# Patient Record
Sex: Male | Born: 1955 | Race: Black or African American | Hispanic: No | Marital: Married | Smoking: Current some day smoker
Health system: Southern US, Community
[De-identification: ages and names within clinical notes are randomized; demographics above are authoritative.]

## PROBLEM LIST (undated history)

## (undated) DIAGNOSIS — E1140 Type 2 diabetes mellitus with diabetic neuropathy, unspecified: Secondary | ICD-10-CM

## (undated) DIAGNOSIS — C61 Malignant neoplasm of prostate: Secondary | ICD-10-CM

## (undated) DIAGNOSIS — I69359 Hemiplegia and hemiparesis following cerebral infarction affecting unspecified side: Principal | ICD-10-CM

## (undated) DIAGNOSIS — I739 Peripheral vascular disease, unspecified: Secondary | ICD-10-CM

## (undated) DIAGNOSIS — E119 Type 2 diabetes mellitus without complications: Secondary | ICD-10-CM

## (undated) DIAGNOSIS — I10 Essential (primary) hypertension: Secondary | ICD-10-CM

## (undated) DIAGNOSIS — I639 Cerebral infarction, unspecified: Secondary | ICD-10-CM

## (undated) DIAGNOSIS — E785 Hyperlipidemia, unspecified: Secondary | ICD-10-CM

## (undated) DIAGNOSIS — I2510 Atherosclerotic heart disease of native coronary artery without angina pectoris: Secondary | ICD-10-CM

## (undated) DIAGNOSIS — Z720 Tobacco use: Secondary | ICD-10-CM

## (undated) DIAGNOSIS — I69398 Other sequelae of cerebral infarction: Principal | ICD-10-CM

## (undated) HISTORY — DX: Tobacco use: Z72.0

## (undated) HISTORY — DX: Type 2 diabetes mellitus with diabetic neuropathy, unspecified: E11.40

## (undated) HISTORY — DX: Malignant neoplasm of prostate: C61

## (undated) HISTORY — DX: Hemiplegia and hemiparesis following cerebral infarction affecting unspecified side: I69.359

## (undated) HISTORY — DX: Hyperlipidemia, unspecified: E78.5

## (undated) HISTORY — DX: Other sequelae of cerebral infarction: I69.398

## (undated) HISTORY — PX: REPLACEMENT TOTAL KNEE: SUR1224

## (undated) HISTORY — DX: Peripheral vascular disease, unspecified: I73.9

## (undated) HISTORY — DX: Atherosclerotic heart disease of native coronary artery without angina pectoris: I25.10

---

## 2014-01-29 ENCOUNTER — Encounter (HOSPITAL_COMMUNITY): Payer: Self-pay

## 2014-01-29 ENCOUNTER — Emergency Department (INDEPENDENT_AMBULATORY_CARE_PROVIDER_SITE_OTHER)
Admission: EM | Admit: 2014-01-29 | Discharge: 2014-01-29 | Disposition: A | Discharge status: Home / Self Care | Payer: Medicare Other | Source: Home / Self Care | Attending: Family Medicine | Admitting: Family Medicine

## 2014-01-29 DIAGNOSIS — E1065 Type 1 diabetes mellitus with hyperglycemia: Secondary | ICD-10-CM

## 2014-01-29 HISTORY — DX: Type 2 diabetes mellitus without complications: E11.9

## 2014-01-29 HISTORY — DX: Essential (primary) hypertension: I10

## 2014-01-29 LAB — POCT I-STAT, CHEM 8
BUN: 17 mg/dL (ref 6–23)
CALCIUM ION: 1.17 mmol/L (ref 1.12–1.23)
CHLORIDE: 103 meq/L (ref 96–112)
CREATININE: 0.9 mg/dL (ref 0.50–1.35)
Glucose, Bld: 307 mg/dL — ABNORMAL HIGH (ref 70–99)
HCT: 42 % (ref 39.0–52.0)
Hemoglobin: 14.3 g/dL (ref 13.0–17.0)
Potassium: 3.9 mEq/L (ref 3.7–5.3)
Sodium: 141 mEq/L (ref 137–147)
TCO2: 28 mmol/L (ref 0–100)

## 2014-01-29 MED ORDER — INSULIN ASPART 100 UNIT/ML ~~LOC~~ SOLN: 10.0000 [IU] | Freq: Three times a day (TID) | SUBCUTANEOUS | Status: DC

## 2014-01-29 MED ORDER — INSULIN DETEMIR 100 UNIT/ML ~~LOC~~ SOLN: 32.0000 [IU] | Freq: Every day | SUBCUTANEOUS | Status: DC

## 2014-01-29 NOTE — ED Notes (Signed)
Here to relocate from [place], out of medications

## 2014-01-29 NOTE — ED Provider Notes (Signed)
CSN: [id number]     Arrival date & time 01/29/14  1243 History   First MD Initiated Contact with Patient 01/29/14 1311     Chief Complaint  Patient presents with  . Medication Refill   (Consider location/radiation/quality/duration/timing/severity/associated sxs/prior Treatment) Patient is a 58 y.o. male presenting with diabetes problem. The history is provided by the patient.  Diabetes This is a new problem. The current episode started 2 days ago (relocating to [place] and left meds behind in [place].). Pertinent negatives include no chest pain, no abdominal pain, no headaches and no shortness of breath.    Past Medical History  Diagnosis Date  . Hypertension   . Diabetes mellitus without complication    History reviewed. No pertinent past surgical history. History reviewed. No pertinent family history. History  Substance Use Topics  . Smoking status: Current Every Day Smoker  . Smokeless tobacco: Not on file  . Alcohol Use: No    Review of Systems  Constitutional: Negative.   Respiratory: Negative.  Negative for shortness of breath.   Cardiovascular: Negative for chest pain.  Gastrointestinal: Negative for abdominal pain.  Endocrine: Positive for polyuria.  Neurological: Negative.  Negative for headaches.    Allergies  Review of patient's allergies indicates no known allergies.  Home Medications   Prior to Admission medications   Medication Sig Start Date End Date Taking? Authorizing Provider  aspirin 81 MG tablet Take 81 mg by mouth daily.   Yes Historical Provider, MD  gabapentin (NEURONTIN) 600 MG tablet Take 600 mg by mouth 3 (three) times daily.   Yes Historical Provider, MD  insulin NPH Human (HUMULIN N,NOVOLIN N) 100 UNIT/ML injection Inject into the skin.   Yes Historical Provider, MD  lisinopril (PRINIVIL,ZESTRIL) 5 MG tablet Take 5 mg by mouth daily.   Yes Historical Provider, MD  meloxicam (MOBIC) 7.5 MG tablet Take 15 mg by mouth daily.   Yes Historical Provider,  MD  metoprolol tartrate (LOPRESSOR) 25 MG tablet Take 25 mg by mouth 2 (two) times daily.   Yes Historical Provider, MD  omeprazole (PRILOSEC) 20 MG capsule Take 20 mg by mouth daily.   Yes Historical Provider, MD  sertraline (ZOLOFT) 50 MG tablet Take 50 mg by mouth daily.   Yes Historical Provider, MD  simvastatin (ZOCOR) 40 MG tablet Take 40 mg by mouth daily.   Yes Historical Provider, MD  traMADol (ULTRAM) 50 MG tablet Take by mouth every 6 (six) hours as needed.   Yes Historical Provider, MD  insulin aspart (NOVOLOG) 100 UNIT/ML injection Inject 10 Units into the skin 3 (three) times daily before meals. 01/29/14   Billy Fischer, MD  insulin detemir (LEVEMIR) 100 UNIT/ML injection Inject 0.32 mLs (32 Units total) into the skin at bedtime. 01/29/14   Billy Fischer, MD   BP 164/95 mmHg  Pulse 85  Temp(Src) 98.1 F (36.7 C) (Oral)  Resp 18  SpO2 98% Physical Exam  Constitutional: He is oriented to person, place, and time. He appears well-developed and well-nourished.  Eyes: Pupils are equal, round, and reactive to light.  Neck: Normal range of motion. Neck supple.  Neurological: He is alert and oriented to person, place, and time.  Skin: Skin is warm and dry.  Nursing note and vitals reviewed.   ED Course  Procedures (including critical care time) Labs Review Labs Reviewed  POCT I-STAT, CHEM 8 - Abnormal; Notable for the following:    Glucose, Bld 307 (*)    All other components within  normal limits   i-stat wnl but bs 307. Imaging Review No results found.   MDM   1. Type 1 diabetes mellitus with hyperglycemia       Billy Fischer, MD 01/29/14 1352

## 2014-02-22 ENCOUNTER — Ambulatory Visit: Payer: Medicare Other | Attending: Internal Medicine | Admitting: Internal Medicine

## 2014-02-22 ENCOUNTER — Encounter: Payer: Self-pay | Admitting: Internal Medicine

## 2014-02-22 VITALS — BP 148/84 | HR 99 | Temp 98.4°F | Resp 16 | Ht 71.5 in | Wt 246.0 lb

## 2014-02-22 DIAGNOSIS — E785 Hyperlipidemia, unspecified: Secondary | ICD-10-CM | POA: Diagnosis not present

## 2014-02-22 DIAGNOSIS — F32A Depression, unspecified: Secondary | ICD-10-CM

## 2014-02-22 DIAGNOSIS — M545 Low back pain: Secondary | ICD-10-CM

## 2014-02-22 DIAGNOSIS — F17210 Nicotine dependence, cigarettes, uncomplicated: Secondary | ICD-10-CM | POA: Insufficient documentation

## 2014-02-22 DIAGNOSIS — E119 Type 2 diabetes mellitus without complications: Secondary | ICD-10-CM | POA: Diagnosis not present

## 2014-02-22 DIAGNOSIS — K219 Gastro-esophageal reflux disease without esophagitis: Secondary | ICD-10-CM | POA: Diagnosis not present

## 2014-02-22 DIAGNOSIS — I10 Essential (primary) hypertension: Secondary | ICD-10-CM

## 2014-02-22 DIAGNOSIS — F329 Major depressive disorder, single episode, unspecified: Secondary | ICD-10-CM

## 2014-02-22 DIAGNOSIS — Z720 Tobacco use: Secondary | ICD-10-CM

## 2014-02-22 DIAGNOSIS — F17200 Nicotine dependence, unspecified, uncomplicated: Secondary | ICD-10-CM

## 2014-02-22 LAB — POCT URINALYSIS DIPSTICK
BILIRUBIN UA: NEGATIVE
Glucose, UA: 100
Ketones, UA: NEGATIVE
Leukocytes, UA: NEGATIVE
NITRITE UA: NEGATIVE
Protein, UA: 100
SPEC GRAV UA: 1.02
Urobilinogen, UA: 0.2
pH, UA: 5

## 2014-02-22 LAB — POCT GLYCOSYLATED HEMOGLOBIN (HGB A1C): HEMOGLOBIN A1C: 9.5

## 2014-02-22 LAB — GLUCOSE, POCT (MANUAL RESULT ENTRY): POC GLUCOSE: 183 mg/dL — AB (ref 70–99)

## 2014-02-22 MED ORDER — GABAPENTIN 600 MG PO TABS: 600.0000 mg | ORAL_TABLET | Freq: Three times a day (TID) | ORAL | Status: DC

## 2014-02-22 MED ORDER — SERTRALINE HCL 50 MG PO TABS: 50.0000 mg | ORAL_TABLET | Freq: Every day | ORAL | Status: DC

## 2014-02-22 MED ORDER — OMEPRAZOLE 20 MG PO CPDR: 20.0000 mg | DELAYED_RELEASE_CAPSULE | Freq: Every day | ORAL | Status: DC

## 2014-02-22 MED ORDER — LISINOPRIL 10 MG PO TABS: 10.0000 mg | ORAL_TABLET | Freq: Every day | ORAL | Status: DC

## 2014-02-22 MED ORDER — SIMVASTATIN 40 MG PO TABS: 40.0000 mg | ORAL_TABLET | Freq: Every day | ORAL | Status: DC

## 2014-02-22 MED ORDER — MELOXICAM 7.5 MG PO TABS: 7.5000 mg | ORAL_TABLET | Freq: Every day | ORAL | Status: DC

## 2014-02-22 NOTE — Progress Notes (Signed)
Patient ID: Thomas Sosa, male   DOB: 10/21/1955, 58 y.o.   MRN: [id number]   Rondrick Barreira, is a 58 y.o. male  FAO:[id number]  ONG:[id number]  DOB - 1955/12/24  CC:  Chief Complaint  Patient presents with  . Establish Care  . Diabetes       HPI: Thomas Sosa is a 58 y.o. male here today to establish medical care.  Patient reports that he was diagnosed with type 2 diabetes several years ago.  He reports that he was out of his insulin for a few weeks when he moved here from [place].  He just started back on his insulin 5 days ago.  He reports that while he was out of insulin his blood sugar was up to 600.  He is homeless but in the process of finding a home.  He was in a bad relationship in [place] and decided to move to [place].  He was recently increased to lisinopril 10 mg but is continuing to finish his 5 mg pills before switching to 10 mg.  He is on gabapentin for in BLE neuropathy.  He needs refills on his simvastatin.  He has been taking Levemir 32 units BID and a sliding scale for Novolog.   No Known Allergies Past Medical History  Diagnosis Date  . Hypertension   . Diabetes mellitus without complication   . Diabetic neuropathy Dx 2011  . Cancer of prostate Dx 2005   Current Outpatient Prescriptions on File Prior to Visit  Medication Sig Dispense Refill  . aspirin 81 MG tablet Take 81 mg by mouth daily.    [name] gabapentin (NEURONTIN) 600 MG tablet Take 600 mg by mouth 3 (three) times daily.    . insulin aspart (NOVOLOG) 100 UNIT/ML injection Inject 10 Units into the skin 3 (three) times daily before meals. 10 mL 11  . insulin detemir (LEVEMIR) 100 UNIT/ML injection Inject 0.32 mLs (32 Units total) into the skin at bedtime. 10 mL 11  . insulin NPH Human (HUMULIN N,NOVOLIN N) 100 UNIT/ML injection Inject into the skin.    [name] lisinopril (PRINIVIL,ZESTRIL) 5 MG tablet Take 5 mg by mouth daily.    . meloxicam (MOBIC) 7.5 MG tablet Take 15 mg by mouth daily.    .  metoprolol tartrate (LOPRESSOR) 25 MG tablet Take 25 mg by mouth 2 (two) times daily.    [name] omeprazole (PRILOSEC) 20 MG capsule Take 20 mg by mouth daily.    . sertraline (ZOLOFT) 50 MG tablet Take 50 mg by mouth daily.    . simvastatin (ZOCOR) 40 MG tablet Take 40 mg by mouth daily.    . traMADol (ULTRAM) 50 MG tablet Take by mouth every 6 (six) hours as needed.     No current facility-administered medications on file prior to visit.   No family history on file. History   Social History  . Marital Status: Widowed    Spouse Name: N/A    Number of Children: N/A  . Years of Education: N/A   Occupational History  . Not on file.   Social History Main Topics  . Smoking status: Current Every Day Smoker  . Smokeless tobacco: Not on file  . Alcohol Use: No  . Drug Use: Not on file  . Sexual Activity: Not on file   Other Topics Concern  . Not on file   Social History Narrative    Review of Systems  Eyes: Negative for blurred vision.  Musculoskeletal: Positive for back pain.  Neurological:  Positive for tingling. Negative for dizziness and headaches.  Endo/Heme/Allergies: Negative for polydipsia.  All other systems reviewed and are negative.     Objective:   Filed Vitals:   02/22/14 1126  BP: 148/84  Pulse: 99  Temp: 98.4 F (36.9 C)  Resp: 16    Physical Exam: Constitutional: Patient appears well-developed and well-nourished. No distress. HENT: Normocephalic, atraumatic, External right and left ear normal. Oropharynx is clear and moist.  Eyes: Conjunctivae and EOM are normal. PERRLA, no scleral icterus. Neck: Normal ROM. Neck supple. No JVD. No tracheal deviation. No thyromegaly. CVS: RRR, S1/S2 +, no murmurs, no gallops, no carotid bruit.  Pulmonary: Effort and breath sounds normal, no stridor, rhonchi, wheezes, rales.  Abdominal: Soft. BS +, no distension, tenderness, rebound or guarding.  Musculoskeletal: Normal range of motion. No edema and no tenderness.    Lymphadenopathy: No lymphadenopathy noted, cervical, Neuro: Alert. Normal reflexes, muscle tone coordination. No cranial nerve deficit. Skin: Skin is warm and dry. No rash noted. Not diaphoretic. No erythema. No pallor. Psychiatric: Normal mood and affect. Behavior, judgment, thought content normal.  Lab Results  Component Value Date   HGB 14.3 01/29/2014   HCT 42.0 01/29/2014   Lab Results  Component Value Date   CREATININE 0.90 01/29/2014   BUN 17 01/29/2014   NA 141 01/29/2014   K 3.9 01/29/2014   CL 103 01/29/2014    Lab Results  Component Value Date   HGBA1C 9.5 02/22/2014   Lipid Panel  No results found for: CHOL, TRIG, HDL, CHOLHDL, VLDL, LDLCALC     Assessment and plan:   Derrin was seen today for establish care and diabetes.  Diagnoses and associated orders for this visit:  Type 2 diabetes mellitus without complication - HgB T0P - Glucose (CBG) - Urinalysis Dipstick - Microalbumin, urine - gabapentin (NEURONTIN) 600 MG tablet; Take 1 tablet (600 mg total) by mouth 3 (three) times daily. May add a sulfonylurea to medication regimen once I have reviewed his CBG log on nurse visit  Essential hypertension - lisinopril (PRINIVIL,ZESTRIL) 10 MG tablet; Take 1 tablet (10 mg total) by mouth daily. Blood pressure He will begin 10 mg tablets and present to clinic for a BP check with nurse.  If he continues to stay elevated he may need to placed back on his metoprolol  HLD (hyperlipidemia) - simvastatin (ZOCOR) 40 MG tablet; Take 1 tablet (40 mg total) by mouth daily at 6 PM. cholesterol Education provided on proper lifestyle changes in order to lower cholesterol. Patient advised to maintain healthy weight and to keep total fat intake at 25-35% of total calories and carbohydrates 50-60% of total daily calories. Explained how high cholesterol places patient at risk for heart disease. Patient placed on appropriate medication and repeat labs in 6 months    Depression - sertraline (ZOLOFT) 50 MG tablet; Take 1 tablet (50 mg total) by mouth daily. For depression  Midline low back pain, with sciatica presence unspecified - meloxicam (MOBIC) 7.5 MG tablet; Take 1 tablet (7.5 mg total) by mouth daily. Back pain  Gastroesophageal reflux disease, esophagitis presence not specified - omeprazole (PRILOSEC) 20 MG capsule; Take 1 capsule (20 mg total) by mouth daily. Acid reflux Discussed diet and weight with patient relating to acid reflux.  Went over things that may exacerbate acid reflux such as tomatoes, spicy foods, coffee, carbonated beverages, chocolates, etc.  Advised patient to avoid laying down at least two hours after meals and sleep with HOB elevated.   Smoking  Smoking cessation discussed for 3 minutes, patient is not willing to quit at this time. Will continue to assess on each visit. Discussed increased risk for diseases such as cancer, heart disease, and stroke.    He was given information for optometrist  and dental offices that may be accepting new patients. He will call back if he needs a referral  Return in about 1 day (around 02/23/2014) for Lab Visit and 2 weeks Nurse Visit--BP and CBG log check.   Chari Manning, NP-C [facility] [phone] 02/22/2014, 11:40 AM

## 2014-02-22 NOTE — Progress Notes (Signed)
Establish Care Hx DM Stated Glucose been running 51mg /dL-341mg /dL Not keeping a balance diet

## 2014-02-22 NOTE — Patient Instructions (Signed)
Smoking Cessation Quitting smoking is important to your health and has many advantages. However, it is not always easy to quit since nicotine is a very addictive drug. Oftentimes, people try 3 times or more before being able to quit. This document explains the best ways for you to prepare to quit smoking. Quitting takes hard work and a lot of effort, but you can do it. ADVANTAGES OF QUITTING SMOKING  You will live longer, feel better, and live better.  Your body will feel the impact of quitting smoking almost immediately.  Within 20 minutes, blood pressure decreases. Your pulse returns to its normal level.  After 8 hours, carbon monoxide levels in the blood return to normal. Your oxygen level increases.  After 24 hours, the chance of having a heart attack starts to decrease. Your breath, hair, and body stop smelling like smoke.  After 48 hours, damaged nerve endings begin to recover. Your sense of taste and smell improve.  After 72 hours, the body is virtually free of nicotine. Your bronchial tubes relax and breathing becomes easier.  After 2 to 12 weeks, lungs can hold more air. Exercise becomes easier and circulation improves.  The risk of having a heart attack, stroke, cancer, or lung disease is greatly reduced.  After 1 year, the risk of coronary heart disease is cut in half.  After 5 years, the risk of stroke falls to the same as a nonsmoker.  After 10 years, the risk of lung cancer is cut in half and the risk of other cancers decreases significantly.  After 15 years, the risk of coronary heart disease drops, usually to the level of a nonsmoker.  If you are pregnant, quitting smoking will improve your chances of having a healthy baby.  The people you live with, especially any children, will be healthier.  You will have extra money to spend on things other than cigarettes. QUESTIONS TO THINK ABOUT BEFORE ATTEMPTING TO QUIT You may want to talk about your answers with your  health care provider.  Why do you want to quit?  If you tried to quit in the past, what helped and what did not?  What will be the most difficult situations for you after you quit? How will you plan to handle them?  Who can help you through the tough times? Your family? Friends? A health care provider?  What pleasures do you get from smoking? What ways can you still get pleasure if you quit? Here are some questions to ask your health care provider:  How can you help me to be successful at quitting?  What medicine do you think would be best for me and how should I take it?  What should I do if I need more help?  What is smoking withdrawal like? How can I get information on withdrawal? GET READY  Set a quit date.  Change your environment by getting rid of all cigarettes, ashtrays, matches, and lighters in your home, car, or work. Do not let people smoke in your home.  Review your past attempts to quit. Think about what worked and what did not. GET SUPPORT AND ENCOURAGEMENT You have a better chance of being successful if you have help. You can get support in many ways.  Tell your family, friends, and coworkers that you are going to quit and need their support. Ask them not to smoke around you.  Get individual, group, or telephone counseling and support. Programs are available at local hospitals and health centers. Call   your local health department for information about programs in your area.  Spiritual beliefs and practices may help some smokers quit.  Download a "quit meter" on your computer to keep track of quit statistics, such as how long you have gone without smoking, cigarettes not smoked, and money saved.  Get a self-help book about quitting smoking and staying off tobacco. LEARN NEW SKILLS AND BEHAVIORS  Distract yourself from urges to smoke. Talk to someone, go for a walk, or occupy your time with a task.  Change your normal routine. Take a different route to work.  Drink tea instead of coffee. Eat breakfast in a different place.  Reduce your stress. Take a hot bath, exercise, or read a book.  Plan something enjoyable to do every day. Reward yourself for not smoking.  Explore interactive web-based programs that specialize in helping you quit. GET MEDICINE AND USE IT CORRECTLY Medicines can help you stop smoking and decrease the urge to smoke. Combining medicine with the above behavioral methods and support can greatly increase your chances of successfully quitting smoking.  Nicotine replacement therapy helps deliver nicotine to your body without the negative effects and risks of smoking. Nicotine replacement therapy includes nicotine gum, lozenges, inhalers, nasal sprays, and skin patches. Some may be available over-the-counter and others require a prescription.  Antidepressant medicine helps people abstain from smoking, but how this works is unknown. This medicine is available by prescription.  Nicotinic receptor partial agonist medicine simulates the effect of nicotine in your brain. This medicine is available by prescription. Ask your health care provider for advice about which medicines to use and how to use them based on your health history. Your health care provider will tell you what side effects to look out for if you choose to be on a medicine or therapy. Carefully read the information on the package. Do not use any other product containing nicotine while using a nicotine replacement product.  RELAPSE OR DIFFICULT SITUATIONS Most relapses occur within the first 3 months after quitting. Do not be discouraged if you start smoking again. Remember, most people try several times before finally quitting. You may have symptoms of withdrawal because your body is used to nicotine. You may crave cigarettes, be irritable, feel very hungry, cough often, get headaches, or have difficulty concentrating. The withdrawal symptoms are only temporary. They are strongest  when you first quit, but they will go away within 10-14 days. To reduce the chances of relapse, try to:  Avoid drinking alcohol. Drinking lowers your chances of successfully quitting.  Reduce the amount of caffeine you consume. Once you quit smoking, the amount of caffeine in your body increases and can give you symptoms, such as a rapid heartbeat, sweating, and anxiety.  Avoid smokers because they can make you want to smoke.  Do not let weight gain distract you. Many smokers will gain weight when they quit, usually less than 10 pounds. Eat a healthy diet and stay active. You can always lose the weight gained after you quit.  Find ways to improve your mood other than smoking. FOR MORE INFORMATION  www.smokefree.gov  Document Released: 03/06/2001 Document Revised: 07/27/2013 Document Reviewed: 06/21/2011 Exit[employer] Patient Information 2015 [employer]. This information is not intended to replace advice given to you by your health care provider. Make sure you discuss any questions you have with your health care provider.  

## 2014-02-23 ENCOUNTER — Other Ambulatory Visit: Payer: Medicare Other

## 2014-02-23 LAB — MICROALBUMIN, URINE: MICROALB UR: 56.9 mg/dL — AB (ref <2.0)

## 2014-03-02 ENCOUNTER — Telehealth: Payer: Self-pay | Admitting: Cardiovascular Disease

## 2014-03-02 NOTE — Telephone Encounter (Signed)
Received records from [facility] (Dr Jed Limerick) for appointment with Dr Gwenlyn Found on 03/24/14.  Records given to [facility] (medical records) for Dr Kennon Holter schedule on 03/24/14.  lp

## 2014-03-08 ENCOUNTER — Other Ambulatory Visit: Payer: Medicare Other

## 2014-03-24 ENCOUNTER — Encounter: Payer: Self-pay | Admitting: Cardiovascular Disease

## 2014-03-24 ENCOUNTER — Ambulatory Visit (INDEPENDENT_AMBULATORY_CARE_PROVIDER_SITE_OTHER): Payer: Medicare Other | Admitting: Cardiovascular Disease

## 2014-03-24 VITALS — BP 153/85 | HR 76 | Ht 71.5 in | Wt 238.0 lb

## 2014-03-24 DIAGNOSIS — I10 Essential (primary) hypertension: Secondary | ICD-10-CM

## 2014-03-24 DIAGNOSIS — G629 Polyneuropathy, unspecified: Secondary | ICD-10-CM

## 2014-03-24 DIAGNOSIS — E1142 Type 2 diabetes mellitus with diabetic polyneuropathy: Secondary | ICD-10-CM | POA: Insufficient documentation

## 2014-03-24 DIAGNOSIS — I2583 Coronary atherosclerosis due to lipid rich plaque: Secondary | ICD-10-CM

## 2014-03-24 DIAGNOSIS — E1342 Other specified diabetes mellitus with diabetic polyneuropathy: Secondary | ICD-10-CM

## 2014-03-24 DIAGNOSIS — E785 Hyperlipidemia, unspecified: Secondary | ICD-10-CM | POA: Insufficient documentation

## 2014-03-24 DIAGNOSIS — L97509 Non-pressure chronic ulcer of other part of unspecified foot with unspecified severity: Secondary | ICD-10-CM

## 2014-03-24 DIAGNOSIS — I2510 Atherosclerotic heart disease of native coronary artery without angina pectoris: Secondary | ICD-10-CM

## 2014-03-24 DIAGNOSIS — Z951 Presence of aortocoronary bypass graft: Secondary | ICD-10-CM | POA: Insufficient documentation

## 2014-03-24 NOTE — Assessment & Plan Note (Signed)
On statin therapy 

## 2014-03-24 NOTE — Patient Instructions (Signed)
Your physician recommends that you schedule a follow-up appointment in: As Needed  Your physician has requested that you have a lower extremity arterial duplex. This test is an ultrasound of the arteries in the legs. It looks at arterial blood flow in the legs. Allow one hour for Lower Arterial scans. There are no restrictions or special instructions

## 2014-03-24 NOTE — Assessment & Plan Note (Signed)
17 year history of diabetes, last 9 years on insulin. He has classic dysesthesias in a stocking distribution consistent with diabetic peripheral neuropathy.

## 2014-03-24 NOTE — Assessment & Plan Note (Signed)
History of hypertension on lisinopril 10 mg a day and metoprolol titrate. His blood pressure today is 153/85. Continue current medications at current dosing

## 2014-03-24 NOTE — Progress Notes (Signed)
03/24/2014 Thomas Sosa   October 18, 1955  [id number]  Primary Physician No PCP Per Patient Primary Cardiologist: Lorretta Harp MD Renae Gloss   HPI:  Thomas Sosa is a 58 year old mildly overweight widowed African-American male father of one child who recently relocated one month ago from [place] to [place]. He was referred by Dr. Geroge Baseman, his podiatrist, for peripheral vascular evaluation because of peripheral paresthesias and absent pedal pulses. His risk factors include treated hypertension, insulin dependent diabetes, hyperlipidemia and tobacco abuse. He has smoked one half pack a day for the last 40 years. He has a strong family history of heart disease. He has been disabled for the last 10 years. 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 He had a procedure performed on his left fourth toe by Dr. Geroge Baseman. He was referred here for peripheral vascular evaluation.   Current Outpatient Prescriptions  Medication Sig Dispense Refill  . aspirin 81 MG tablet Take 81 mg by mouth daily.    [name] gabapentin (NEURONTIN) 600 MG tablet Take 1 tablet (600 mg total) by mouth 3 (three) times daily. 90 tablet 3  . insulin aspart (NOVOLOG) 100 UNIT/ML injection Inject 10 Units into the skin 3 (three) times daily before meals. 10 mL 11  . insulin detemir (LEVEMIR) 100 UNIT/ML injection Inject 0.32 mLs (32 Units total) into the skin at bedtime. (Patient taking differently: Inject 32 Units into the skin 2 (two) times daily. ) 10 mL 11  . lisinopril (PRINIVIL,ZESTRIL) 10 MG tablet Take 1 tablet (10 mg total) by mouth daily. Blood pressure 30 tablet 3  . meloxicam (MOBIC) 7.5 MG tablet Take 1 tablet (7.5 mg total) by mouth daily. Back pain 30 tablet 1  . metoprolol tartrate (LOPRESSOR) 25 MG tablet Take 12.5 mg by mouth daily.     [name] omeprazole (PRILOSEC) 20 MG capsule  Take 1 capsule (20 mg total) by mouth daily. Acid reflux 30 capsule 4  . sertraline (ZOLOFT) 50 MG tablet Take 1 tablet (50 mg total) by mouth daily. For depression 30 tablet 3  . simvastatin (ZOCOR) 40 MG tablet Take 1 tablet (40 mg total) by mouth daily at 6 PM. cholesterol 30 tablet 3  . traMADol (ULTRAM) 50 MG tablet Take by mouth every 6 (six) hours as needed.     No current facility-administered medications for this visit.    No Known Allergies  History   Social History  . Marital Status: Widowed    Spouse Name: N/A    Number of Children: N/A  . Years of Education: N/A   Occupational History  . Not on file.   Social History Main Topics  . Smoking status: Current Every Day Smoker  . Smokeless tobacco: Not on file  . Alcohol Use: No  . Drug Use: Not on file  . Sexual Activity: Not on file   Other Topics Concern  . Not on file   Social History Narrative     Review of Systems: General: negative for chills, fever, night sweats or weight changes.  Cardiovascular: negative for chest pain, dyspnea on exertion, edema, orthopnea, palpitations, paroxysmal nocturnal dyspnea or shortness of breath Dermatological: negative for rash Respiratory: negative for cough or wheezing Urologic: negative for hematuria Abdominal: negative for nausea, vomiting, diarrhea, bright red blood per rectum, melena, or hematemesis Neurologic: negative for visual changes, syncope, or dizziness All other systems reviewed and are  otherwise negative except as noted above.    Blood pressure 153/85, pulse 76, height 5' 11.5" (1.816 m), weight 238 lb (107.956 kg).  General appearance: alert and no distress Neck: no adenopathy, no carotid bruit, no JVD, supple, symmetrical, trachea midline and thyroid not enlarged, symmetric, no tenderness/mass/nodules Lungs: clear to auscultation bilaterally Heart: regular rate and rhythm, S1, S2 normal, no murmur, click, rub or gallop Extremities: extremities normal,  atraumatic, no cyanosis or edema and absent pedal pulses  EKG not performed today  ASSESSMENT AND PLAN:   Hyperlipidemia On statin therapy  Coronary artery disease History of CAD status post myocardial infarction 2 years ago and subsequent coronary artery bypass grafting in [place]. Patient denies chest pain  HTN (hypertension) History of hypertension on lisinopril 10 mg a day and metoprolol titrate. His blood pressure today is 153/85. Continue current medications at current dosing  Diabetic peripheral neuropathy 17 year history of diabetes, last 9 years on insulin. He has classic dysesthesias in a stocking distribution consistent with diabetic peripheral neuropathy.      Lorretta Harp MD FACP,FACC,FAHA, [facility] 03/24/2014 12:15 PM

## 2014-03-24 NOTE — Assessment & Plan Note (Signed)
History of CAD status post myocardial infarction 2 years ago and subsequent coronary artery bypass grafting in [place]. Patient denies chest pain

## 2014-04-05 ENCOUNTER — Ambulatory Visit (HOSPITAL_COMMUNITY)
Admission: RE | Admit: 2014-04-05 | Discharge: 2014-04-05 | Disposition: A | Discharge status: Home / Self Care | Payer: Medicare Other | Source: Ambulatory Visit | Attending: Cardiovascular Disease | Admitting: Cardiovascular Disease

## 2014-04-05 DIAGNOSIS — I10 Essential (primary) hypertension: Secondary | ICD-10-CM | POA: Insufficient documentation

## 2014-04-05 DIAGNOSIS — Z951 Presence of aortocoronary bypass graft: Secondary | ICD-10-CM | POA: Insufficient documentation

## 2014-04-05 DIAGNOSIS — I739 Peripheral vascular disease, unspecified: Secondary | ICD-10-CM | POA: Insufficient documentation

## 2014-04-05 DIAGNOSIS — L97509 Non-pressure chronic ulcer of other part of unspecified foot with unspecified severity: Secondary | ICD-10-CM | POA: Insufficient documentation

## 2014-04-05 DIAGNOSIS — E1342 Other specified diabetes mellitus with diabetic polyneuropathy: Secondary | ICD-10-CM

## 2014-04-05 DIAGNOSIS — Z720 Tobacco use: Secondary | ICD-10-CM | POA: Insufficient documentation

## 2014-04-05 DIAGNOSIS — I2510 Atherosclerotic heart disease of native coronary artery without angina pectoris: Secondary | ICD-10-CM | POA: Diagnosis not present

## 2014-04-05 NOTE — Progress Notes (Signed)
Arterial Duplex Lower Ext. Completed. Deval Mroczka, BS, RDMS, RVT  

## 2014-04-09 ENCOUNTER — Encounter: Payer: Self-pay | Admitting: Cardiovascular Disease

## 2014-04-09 ENCOUNTER — Ambulatory Visit (INDEPENDENT_AMBULATORY_CARE_PROVIDER_SITE_OTHER): Payer: Medicare Other | Admitting: Cardiovascular Disease

## 2014-04-09 VITALS — BP 146/92 | HR 76 | Ht 71.5 in | Wt 238.0 lb

## 2014-04-09 DIAGNOSIS — E785 Hyperlipidemia, unspecified: Secondary | ICD-10-CM

## 2014-04-09 DIAGNOSIS — I739 Peripheral vascular disease, unspecified: Secondary | ICD-10-CM

## 2014-04-09 DIAGNOSIS — Z79899 Other long term (current) drug therapy: Secondary | ICD-10-CM

## 2014-04-09 NOTE — Progress Notes (Signed)
Mr. Hyser returns today for follow-up of his lower extremity arterial Doppler studies performed 04/05/14. 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 He did have what appeared to be a Baker's cyst in his right leg. He has no evidence of critical limb ischemia and denies claudication. Conservative therapy is recommended. I will see him back in 6 months.  Lorretta Harp, M.D., [place], [facility], [place] [address] [place], [place]  [address]  [phone] 04/09/2014 10:14 AM

## 2014-04-09 NOTE — Patient Instructions (Signed)
Your physician recommends that you return for lab work FASTING  Your physician wants you to follow-up in: 6 months with Dr.Berry. You will receive a reminder letter in the mail two months in advance. If you don't receive a letter, please call our office to schedule the follow-up appointment.

## 2014-04-09 NOTE — Assessment & Plan Note (Signed)
Patient returns for follow-up of his lower extremity arterial Doppler studies performed in our office 04/05/14. These were done because of absent pedal pulses and numb sensation. His ABIs were 0.78 on the right and 0.65 on the left. He did have typical tibial vessel disease or diabetic. He really denies claudication. He has no open wounds or evidence of critical limb ischemia. At this point we talked about conservative care and optimal foot hygiene. I will see him back in 6 months.

## 2014-04-10 LAB — HEPATIC FUNCTION PANEL
ALK PHOS: 68 U/L (ref 39–117)
ALT: 8 U/L (ref 0–53)
AST: 10 U/L (ref 0–37)
Albumin: 3.7 g/dL (ref 3.5–5.2)
BILIRUBIN INDIRECT: 0.6 mg/dL (ref 0.2–1.2)
BILIRUBIN TOTAL: 0.7 mg/dL (ref 0.2–1.2)
Bilirubin, Direct: 0.1 mg/dL (ref 0.0–0.3)
Total Protein: 7.4 g/dL (ref 6.0–8.3)

## 2014-04-10 LAB — LIPID PANEL
Cholesterol: 160 mg/dL (ref 0–200)
HDL: 34 mg/dL — AB (ref >39)
LDL CALC: 106 mg/dL — AB (ref 0–99)
Total CHOL/HDL Ratio: 4.7 Ratio
Triglycerides: 100 mg/dL (ref <150)
VLDL: 20 mg/dL (ref 0–40)

## 2014-04-12 ENCOUNTER — Telehealth: Payer: Self-pay | Admitting: *Deleted

## 2014-04-12 DIAGNOSIS — Z79899 Other long term (current) drug therapy: Secondary | ICD-10-CM

## 2014-04-12 DIAGNOSIS — E785 Hyperlipidemia, unspecified: Secondary | ICD-10-CM

## 2014-04-12 MED ORDER — ATORVASTATIN CALCIUM 40 MG PO TABS: 40.0000 mg | ORAL_TABLET | Freq: Every day | ORAL | Status: DC

## 2014-04-12 NOTE — Telephone Encounter (Signed)
-----   Message from Lorretta Harp, MD sent at 04/11/2014  9:43 PM EST ----- Cahnge to atorvastatin 40 and recheck

## 2014-04-12 NOTE — Telephone Encounter (Signed)
Patient notified of results [facility] sent in Lab slip mailed for recheck.

## 2014-05-12 ENCOUNTER — Telehealth: Payer: Self-pay | Admitting: Cardiovascular Disease

## 2014-05-12 ENCOUNTER — Ambulatory Visit: Payer: Medicare HMO | Attending: Internal Medicine | Admitting: Internal Medicine

## 2014-05-12 ENCOUNTER — Encounter: Payer: Self-pay | Admitting: Internal Medicine

## 2014-05-12 VITALS — BP 168/81 | HR 74 | Temp 97.7°F | Resp 16 | Ht 71.5 in | Wt 252.0 lb

## 2014-05-12 DIAGNOSIS — E119 Type 2 diabetes mellitus without complications: Secondary | ICD-10-CM | POA: Diagnosis present

## 2014-05-12 DIAGNOSIS — I10 Essential (primary) hypertension: Secondary | ICD-10-CM | POA: Insufficient documentation

## 2014-05-12 DIAGNOSIS — E785 Hyperlipidemia, unspecified: Secondary | ICD-10-CM | POA: Diagnosis not present

## 2014-05-12 DIAGNOSIS — I739 Peripheral vascular disease, unspecified: Secondary | ICD-10-CM

## 2014-05-12 LAB — GLUCOSE, POCT (MANUAL RESULT ENTRY): POC Glucose: 319 mg/dl — AB (ref 70–99)

## 2014-05-12 LAB — POCT GLYCOSYLATED HEMOGLOBIN (HGB A1C): Hemoglobin A1C: 12.4

## 2014-05-12 MED ORDER — INSULIN ASPART 100 UNIT/ML ~~LOC~~ SOLN
10.0000 [IU] | Freq: Once | SUBCUTANEOUS | Status: AC
  Administered 2014-05-12: 10 [IU] via SUBCUTANEOUS

## 2014-05-12 MED ORDER — LISINOPRIL 20 MG PO TABS: 20.0000 mg | ORAL_TABLET | Freq: Every day | ORAL | Status: DC

## 2014-05-12 NOTE — Telephone Encounter (Signed)
Pt need a note stating that it is all right for him to excerise at the [facility].Please call Cynda Familia at [phone].

## 2014-05-12 NOTE — Patient Instructions (Addendum)
Increase lisinopril to 20 mg daily (BP pills).    Please bring blood sugar log for review and medication changes.  Fax # [phone]  Peripheral Vascular Disease Peripheral Vascular Disease (PVD), also called Peripheral Arterial Disease (PAD), is a circulation problem caused by cholesterol (atherosclerotic plaque) deposits in the arteries. PVD commonly occurs in the lower extremities (legs) but it can occur in other areas of the body, such as your arms. The cholesterol buildup in the arteries reduces blood flow which can cause pain and other serious problems. The presence of PVD can place a person at risk for Coronary Artery Disease (CAD).  CAUSES  Causes of PVD can be many. It is usually associated with more than one risk factor such as:   High Cholesterol.  Smoking.  Diabetes.  Lack of exercise or inactivity.  High blood pressure (hypertension).  Obesity.  Family history. SYMPTOMS   When the lower extremities are affected, patients with PVD may experience:  Leg pain with exertion or physical activity. This is called INTERMITTENT CLAUDICATION. This may present as cramping or numbness with physical activity. The location of the pain is associated with the level of blockage. For example, blockage at the abdominal level (distal abdominal aorta) may result in buttock or hip pain. Lower leg arterial blockage may result in calf pain.  As PVD becomes more severe, pain can develop with less physical activity.  In people with severe PVD, leg pain may occur at rest.  Other PVD signs and symptoms:  Leg numbness or weakness.  Coldness in the affected leg or foot, especially when compared to the other leg.  A change in leg color.  Patients with significant PVD are more prone to ulcers or sores on toes, feet or legs. These may take longer to heal or may reoccur. The ulcers or sores can become infected.  If signs and symptoms of PVD are ignored, gangrene may occur. This can result in the  loss of toes or loss of an entire limb.  Not all leg pain is related to PVD. Other medical conditions can cause leg pain such as:  Blood clots (embolism) or Deep Vein Thrombosis.  Inflammation of the blood vessels (vasculitis).  Spinal stenosis. DIAGNOSIS  Diagnosis of PVD can involve several different types of tests. These can include:  Pulse Volume Recording Method (PVR). This test is simple, painless and does not involve the use of X-rays. PVR involves measuring and comparing the blood pressure in the arms and legs. An ABI (Ankle-Brachial Index) is calculated. The normal ratio of blood pressures is 1. As this number becomes smaller, it indicates more severe disease.  < 0.95 - indicates significant narrowing in one or more leg vessels.  <0.8 - there will usually be pain in the foot, leg or buttock with exercise.  <0.4 - will usually have pain in the legs at rest.  <0.25 - usually indicates limb threatening PVD.  Doppler detection of pulses in the legs. This test is painless and checks to see if you have a pulses in your legs/feet.  A dye or contrast material (a substance that highlights the blood vessels so they show up on x-ray) may be given to help your caregiver better see the arteries for the following tests. The dye is eliminated from your body by the kidney's. Your caregiver may order blood work to check your kidney function and other laboratory values before the following tests are performed:  Magnetic Resonance Angiography (MRA). An MRA is a picture study of  the blood vessels and arteries. The MRA machine uses a large magnet to produce images of the blood vessels.  Computed Tomography Angiography (CTA). A CTA is a specialized x-ray that looks at how the blood flows in your blood vessels. An IV may be inserted into your arm so contrast dye can be injected.  Angiogram. Is a procedure that uses x-rays to look at your blood vessels. This procedure is minimally invasive, meaning a  small incision (cut) is made in your groin. A small tube (catheter) is then inserted into the artery of your groin. The catheter is guided to the blood vessel or artery your caregiver wants to examine. Contrast dye is injected into the catheter. X-rays are then taken of the blood vessel or artery. After the images are obtained, the catheter is taken out. TREATMENT  Treatment of PVD involves many interventions which may include:  Lifestyle changes:  Quitting smoking.  Exercise.  Following a low fat, low cholesterol diet.  Control of diabetes.  Foot care is very important to the PVD patient. Good foot care can help prevent infection.  Medication:  Cholesterol-lowering medicine.  Blood pressure medicine.  Anti-platelet drugs.  Certain medicines may reduce symptoms of Intermittent Claudication.  Interventional/Surgical options:  Angioplasty. An Angioplasty is a procedure that inflates a balloon in the blocked artery. This opens the blocked artery to improve blood flow.  Stent Implant. A wire mesh tube (stent) is placed in the artery. The stent expands and stays in place, allowing the artery to remain open.  Peripheral Bypass Surgery. This is a surgical procedure that reroutes the blood around a blocked artery to help improve blood flow. This type of procedure may be performed if Angioplasty or stent implants are not an option. SEEK IMMEDIATE MEDICAL CARE IF:   You develop pain or numbness in your arms or legs.  Your arm or leg turns cold, becomes blue in color.  You develop redness, warmth, swelling and pain in your arms or legs. MAKE SURE YOU:   Understand these instructions.  Will watch your condition.  Will get help right away if you are not doing well or get worse. Document Released: 04/19/2004 Document Revised: 06/04/2011 Document Reviewed: 03/16/2008 [facility] Patient Information 2015 [place], [place]. This information is not intended to replace advice given to you by  your health care provider. Make sure you discuss any questions you have with your health care provider.

## 2014-05-12 NOTE — Progress Notes (Signed)
Patient ID: Thomas Sosa, male   DOB: July 10, 1955, 59 y.o.   MRN: [id number] 1. HTN: Medication: Lisinopril 10 mg, Metoprolol 25 mg Home BP monitoring: Does not check Negative [id number], chest pain, palpitations, SOB Increased lisinopril to 20 mg QD  2. DM2:  Medication: Levemir 32 units QHS and Novolog 10 units TID Home CBG monitoring: checks sugars TID,  Hypoglycemic event: none Positive ROS Neuropathy Negative [id number] vision, polyuria, polydipsia Patient went from a hemoglobin a1c of 9.5 to 12.4. 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 His 48 hour diet recall is all fried foods. He states that he barely eats one meal per day and does not understand why his a1c has increased.    3. HLD: Medication: lipitor 40 mg Tolerance: well Positive ROS  may walk at least a 1/2 block Negative ROS: muscle aches, claudication, Continue follow up with Dr. Gwenlyn Found for possible amputation  Social History reviewed: Smoker quit date ( 4 weeks ago) Exercise: He has joined [employer] but needs a medical release to join the silver sneakers program.    Physical Exam  Constitutional: He is oriented to person, place, and time.  Cardiovascular: Normal rate, regular rhythm and normal heart sounds.   Pulmonary/Chest: Effort normal and breath sounds normal.  Musculoskeletal: He exhibits no edema.  Neurological: He is alert and oriented to person, place, and time.  Skin: Skin is warm and dry.   Thomas Sosa was seen today for follow-up.  Diagnoses and all orders for this visit:  Type 2 diabetes mellitus without complication Orders: -     Glucose (CBG) -     HgB A1c -     insulin aspart (novoLOG) injection 10 Units; Inject 0.1 mLs (10 Units total) into the skin once. Patients diabetes remains uncontrolled as evidence by hemoglobin a1c >8.  Patient a1c went up. Patient has been non-compliant with  medication regimen. Stressed the multiple complications associated with uncontrolled diabetes.  Patient will stay on current medication dose and report back to clinic with cbg log in 2 weeks.  Essential hypertension Orders: -     lisinopril (PRINIVIL,ZESTRIL) 20 MG tablet; Take 1 tablet (20 mg total) by mouth daily. Blood pressure Patient blood pressure remains elevated today, will stress medication compliance. Stressed diet changes, regular exercise regimen, and modifiable risk factors. Will follow up with CMP as needed, Will follow up with patient in 3-6 months.   Peripheral arterial disease Continue with vascular  Hyperlipidemia Education provided on proper lifestyle changes in order to lower cholesterol. Patient advised to maintain healthy weight and to keep total fat intake at 25-35% of total calories and carbohydrates 50-60% of total daily calories. Explained how high cholesterol places patient at risk for heart disease. Patient placed on appropriate medication and repeat labs in 6 months   Return in about 2 weeks (around 05/26/2014) for Nurse Visit-BP check and cbg log review and 3 mo PCP.    Thomas Manning, NP 05/13/2014 2:26 PM

## 2014-05-12 NOTE — Telephone Encounter (Signed)
Pt is calling back to get Dr. Kennon Holter approval that it is ok for him to go to the gym and do excerises. Please f/u  Thanks

## 2014-05-12 NOTE — Progress Notes (Signed)
Pt is here following up on his HTN, diabetes and his hyperlipidemia. Pt reports having chronic and constant pain in his feet.

## 2014-05-14 NOTE — Telephone Encounter (Signed)
Please call,pt says he is still waiting to hear something.

## 2014-05-17 NOTE — Telephone Encounter (Addendum)
Pt stated that he is needed cardiac clearance to be a part of the Program for diabetics with neuropathy at [facility]. Patient stated that he was told by Genevie Cheshire that he needed the ok to be a part of this program. Attempted to call the person over this program at the gym, but no one answered the phone. Will attempt to call again to find out information about the exercises involved in the program so that information can be given to the MD for clearance.

## 2014-05-20 NOTE — Telephone Encounter (Signed)
Pt very upset because he still have not heard anything. He says his sugar is up to 400,he says he needs to exercise.He know the doctor is out of the office until the 29th of February.

## 2014-05-21 NOTE — Telephone Encounter (Signed)
Thomas Sosa was not in. There wasn't anyone available that could tell me about this program. Will forward to Dr. Gwenlyn Found to see if he has any objections to the patient participating in an exercise program.

## 2014-05-25 ENCOUNTER — Ambulatory Visit: Payer: Medicare HMO | Attending: Internal Medicine | Admitting: *Deleted

## 2014-05-25 VITALS — BP 110/68 | HR 82 | Temp 98.2°F | Resp 18

## 2014-05-25 DIAGNOSIS — E1165 Type 2 diabetes mellitus with hyperglycemia: Secondary | ICD-10-CM

## 2014-05-25 DIAGNOSIS — Z794 Long term (current) use of insulin: Secondary | ICD-10-CM | POA: Insufficient documentation

## 2014-05-25 LAB — GLUCOSE, POCT (MANUAL RESULT ENTRY): POC Glucose: 262 mg/dl — AB (ref 70–99)

## 2014-05-25 MED ORDER — GLIPIZIDE ER 5 MG PO TB24: 5.0000 mg | ORAL_TABLET | Freq: Every day | ORAL | Status: DC

## 2014-05-25 NOTE — Patient Instructions (Addendum)
DASH Eating Plan DASH stands for "Dietary Approaches to Stop Hypertension." The DASH eating plan is a healthy eating plan that has been shown to reduce high blood pressure (hypertension). Additional health benefits may include reducing the risk of type 2 diabetes mellitus, heart disease, and stroke. The DASH eating plan may also help with weight loss. WHAT DO I NEED TO KNOW ABOUT THE DASH EATING PLAN? For the DASH eating plan, you will follow these general guidelines:  Choose foods with a percent daily value for sodium of less than 5% (as listed on the food label).  Use salt-free seasonings or herbs instead of table salt or sea salt.  Check with your health care provider or pharmacist before using salt substitutes.  Eat lower-sodium products, often labeled as "lower sodium" or "no salt added."  Eat fresh foods.  Eat more vegetables, fruits, and low-fat dairy products.  Choose whole grains. Look for the word "whole" as the first word in the ingredient list.  Choose fish and skinless chicken or turkey more often than red meat. Limit fish, poultry, and meat to 6 oz (170 g) each day.  Limit sweets, desserts, sugars, and sugary drinks.  Choose heart-healthy fats.  Limit cheese to 1 oz (28 g) per day.  Eat more home-cooked food and less restaurant, buffet, and fast food.  Limit fried foods.  Cook foods using methods other than frying.  Limit canned vegetables. If you do use them, rinse them well to decrease the sodium.  When eating at a restaurant, ask that your food be prepared with less salt, or no salt if possible. WHAT FOODS CAN I EAT? Seek help from a dietitian for individual calorie needs. Grains Whole grain or whole wheat bread. Brown rice. Whole grain or whole wheat pasta. Quinoa, bulgur, and whole grain cereals. Low-sodium cereals. Corn or whole wheat flour tortillas. Whole grain cornbread. Whole grain crackers. Low-sodium crackers. Vegetables Fresh or frozen vegetables  (raw, steamed, roasted, or grilled). Low-sodium or reduced-sodium tomato and vegetable juices. Low-sodium or reduced-sodium tomato sauce and paste. Low-sodium or reduced-sodium canned vegetables.  Fruits All fresh, canned (in natural juice), or frozen fruits. Meat and Other Protein Products Ground beef (85% or leaner), grass-fed beef, or beef trimmed of fat. Skinless chicken or turkey. Ground chicken or turkey. Pork trimmed of fat. All fish and seafood. Eggs. Dried beans, peas, or lentils. Unsalted nuts and seeds. Unsalted canned beans. Dairy Low-fat dairy products, such as skim or 1% milk, 2% or reduced-fat cheeses, low-fat ricotta or cottage cheese, or plain low-fat yogurt. Low-sodium or reduced-sodium cheeses. Fats and Oils Tub margarines without trans fats. Light or reduced-fat mayonnaise and salad dressings (reduced sodium). Avocado. Safflower, olive, or canola oils. Natural peanut or almond butter. Other Unsalted popcorn and pretzels. The items listed above may not be a complete list of recommended foods or beverages. Contact your dietitian for more options. WHAT FOODS ARE NOT RECOMMENDED? Grains White bread. White pasta. White rice. Refined cornbread. Bagels and croissants. Crackers that contain trans fat. Vegetables Creamed or fried vegetables. Vegetables in a cheese sauce. Regular canned vegetables. Regular canned tomato sauce and paste. Regular tomato and vegetable juices. Fruits Dried fruits. Canned fruit in light or heavy syrup. Fruit juice. Meat and Other Protein Products Fatty cuts of meat. Ribs, chicken wings, bacon, sausage, bologna, salami, chitterlings, fatback, hot dogs, bratwurst, and packaged luncheon meats. Salted nuts and seeds. Canned beans with salt. Dairy Whole or 2% milk, cream, half-and-half, and cream cheese. Whole-fat or sweetened yogurt. Full-fat   cheeses or blue cheese. Nondairy creamers and whipped toppings. Processed cheese, cheese spreads, or cheese  curds. Condiments Onion and garlic salt, seasoned salt, table salt, and sea salt. Canned and packaged gravies. Worcestershire sauce. Tartar sauce. Barbecue sauce. Teriyaki sauce. Soy sauce, including reduced sodium. Steak sauce. Fish sauce. Oyster sauce. Cocktail sauce. Horseradish. Ketchup and mustard. Meat flavorings and tenderizers. Bouillon cubes. Hot sauce. Tabasco sauce. Marinades. Taco seasonings. Relishes. Fats and Oils Butter, stick margarine, lard, shortening, ghee, and bacon fat. Coconut, palm kernel, or palm oils. Regular salad dressings. Other Pickles and olives. Salted popcorn and pretzels. The items listed above may not be a complete list of foods and beverages to avoid. Contact your dietitian for more information. WHERE CAN I FIND MORE INFORMATION? National Heart, Lung, and Blood Institute: www.nhlbi.nih.gov/health/health-topics/topics/dash/ Document Released: 03/01/2011 Document Revised: 07/27/2013 Document Reviewed: 01/14/2013 ExitCare Patient Information 2015 [employer]. This information is not intended to replace advice given to you by your health care provider. Make sure you discuss any questions you have with your health care provider. Diabetes Mellitus and Food It is important for you to manage your blood sugar (glucose) level. Your blood glucose level can be greatly affected by what you eat. Eating healthier foods in the appropriate amounts throughout the day at about the same time each day will help you control your blood glucose level. It can also help slow or prevent worsening of your diabetes mellitus. Healthy eating may even help you improve the level of your blood pressure and reach or maintain a healthy weight.  HOW CAN FOOD AFFECT ME? Carbohydrates Carbohydrates affect your blood glucose level more than any other type of food. Your dietitian will help you determine how many carbohydrates to eat at each meal and teach you how to count carbohydrates. Counting  carbohydrates is important to keep your blood glucose at a healthy level, especially if you are using insulin or taking certain medicines for diabetes mellitus. Alcohol Alcohol can cause sudden decreases in blood glucose (hypoglycemia), especially if you use insulin or take certain medicines for diabetes mellitus. Hypoglycemia can be a life-threatening condition. Symptoms of hypoglycemia (sleepiness, dizziness, and disorientation) are similar to symptoms of having too much alcohol.  If your health care provider has given you approval to drink alcohol, do so in moderation and use the following guidelines:  Women should not have more than one drink per day, and men should not have more than two drinks per day. One drink is equal to:  12 oz of beer.  5 oz of wine.  1 oz of hard liquor.  Do not drink on an empty stomach.  Keep yourself hydrated. Have water, diet soda, or unsweetened iced tea.  Regular soda, juice, and other mixers might contain a lot of carbohydrates and should be counted. WHAT FOODS ARE NOT RECOMMENDED? As you make food choices, it is important to remember that all foods are not the same. Some foods have fewer nutrients per serving than other foods, even though they might have the same number of calories or carbohydrates. It is difficult to get your body what it needs when you eat foods with fewer nutrients. Examples of foods that you should avoid that are high in calories and carbohydrates but low in nutrients include:  Trans fats (most processed foods list trans fats on the Nutrition Facts label).  Regular soda.  Juice.  Candy.  Sweets, such as cake, pie, doughnuts, and cookies.  Fried foods. WHAT FOODS CAN I EAT? Have nutrient-rich foods,   which will nourish your body and keep you healthy. The food you should eat also will depend on several factors, including:  The calories you need.  The medicines you take.  Your weight.  Your blood glucose level.  Your  blood pressure level.  Your cholesterol level. You also should eat a variety of foods, including:  Protein, such as meat, poultry, fish, tofu, nuts, and seeds (lean animal proteins are best).  Fruits.  Vegetables.  Dairy products, such as milk, cheese, and yogurt (low fat is best).  Breads, grains, pasta, cereal, rice, and beans.  Fats such as olive oil, trans fat-free margarine, canola oil, avocado, and olives. DOES EVERYONE WITH DIABETES MELLITUS HAVE THE SAME MEAL PLAN? Because every person with diabetes mellitus is different, there is not one meal plan that works for everyone. It is very important that you meet with a dietitian who will help you create a meal plan that is just right for you. Document Released: 12/07/2004 Document Revised: 03/17/2013 Document Reviewed: 02/06/2013 ExitCare Patient Information 2015 [employer]. This information is not intended to replace advice given to you by your health care provider. Make sure you discuss any questions you have with your health care provider. Diabetes and Exercise Exercising regularly is important. It is not just about losing weight. It has many health benefits, such as:  Improving your overall fitness, flexibility, and endurance.  Increasing your bone density.  Helping with weight control.  Decreasing your body fat.  Increasing your muscle strength.  Reducing stress and tension.  Improving your overall health. People with diabetes who exercise gain additional benefits because exercise:  Reduces appetite.  Improves the body's use of blood sugar (glucose).  Helps lower or control blood glucose.  Decreases blood pressure.  Helps control blood lipids (such as cholesterol and triglycerides).  Improves the body's use of the hormone insulin by:  Increasing the body's insulin sensitivity.  Reducing the body's insulin needs.  Decreases the risk for heart disease because exercising:  Lowers cholesterol and  triglycerides levels.  Increases the levels of good cholesterol (such as high-density lipoproteins [HDL]) in the body.  Lowers blood glucose levels. YOUR ACTIVITY PLAN  Choose an activity that you enjoy and set realistic goals. Your health care provider or diabetes educator can help you make an activity plan that works for you. Exercise regularly as directed by your health care provider. This includes:  Performing resistance training twice a week such as push-ups, sit-ups, lifting weights, or using resistance bands.  Performing 150 minutes of cardio exercises each week such as walking, running, or playing sports.  Staying active and spending no more than 90 minutes at one time being inactive. Even short bursts of exercise are good for you. Three 10-minute sessions spread throughout the day are just as beneficial as a single 30-minute session. Some exercise ideas include:  Taking the dog for a walk.  Taking the stairs instead of the elevator.  Dancing to your favorite song.  Doing an exercise video.  Doing your favorite exercise with a friend. RECOMMENDATIONS FOR EXERCISING WITH TYPE 1 OR TYPE 2 DIABETES   Check your blood glucose before exercising. If blood glucose levels are greater than 240 mg/dL, check for urine ketones. Do not exercise if ketones are present.  Avoid injecting insulin into areas of the body that are going to be exercised. For example, avoid injecting insulin into:  The arms when playing tennis.  The legs when jogging.  Keep a record of:  Food   intake before and after you exercise.  Expected peak times of insulin action.  Blood glucose levels before and after you exercise.  The type and amount of exercise you have done.  Review your records with your health care provider. Your health care provider will help you to develop guidelines for adjusting food intake and insulin amounts before and after exercising.  If you take insulin or oral hypoglycemic  agents, watch for signs and symptoms of hypoglycemia. They include:  Dizziness.  Shaking.  Sweating.  Chills.  Confusion.  Drink plenty of water while you exercise to prevent dehydration or heat stroke. Body water is lost during exercise and must be replaced.  Talk to your health care provider before starting an exercise program to make sure it is safe for you. Remember, almost any type of activity is better than none. Document Released: 06/02/2003 Document Revised: 07/27/2013 Document Reviewed: 08/19/2012 [facility] Patient Information 2015 [place], [place]. This information is not intended to replace advice given to you by your health care provider. Make sure you discuss any questions you have with your health care provider. Basic Carbohydrate Counting for Diabetes Mellitus Carbohydrate counting is a method for keeping track of the amount of carbohydrates you eat. Eating carbohydrates naturally increases the level of sugar (glucose) in your blood, so it is important for you to know the amount that is okay for you to have in every meal. Carbohydrate counting helps keep the level of glucose in your blood within normal limits. The amount of carbohydrates allowed is different for every person. A dietitian can help you calculate the amount that is right for you. Once you know the amount of carbohydrates you can have, you can count the carbohydrates in the foods you want to eat. Carbohydrates are found in the following foods:  Grains, such as breads and cereals.  Dried beans and soy products.  Starchy vegetables, such as potatoes, peas, and corn.  Fruit and fruit juices.  Milk and yogurt.  Sweets and snack foods, such as cake, cookies, candy, chips, soft drinks, and fruit drinks. CARBOHYDRATE COUNTING There are two ways to count the carbohydrates in your food. You can use either of the methods or a combination of both. Reading the "Nutrition Facts" on [place] The "Nutrition Facts"  is an area that is included on the labels of almost all packaged food and beverages in the [place]. It includes the serving size of that food or beverage and information about the nutrients in each serving of the food, including the grams (g) of carbohydrate per serving.  Decide the number of servings of this food or beverage that you will be able to eat or drink. Multiply that number of servings by the number of grams of carbohydrate that is listed on the label for that serving. The total will be the amount of carbohydrates you will be having when you eat or drink this food or beverage. Learning Standard Serving Sizes of Food When you eat food that is not packaged or does not include "Nutrition Facts" on the label, you need to measure the servings in order to count the amount of carbohydrates.A serving of most carbohydrate-rich foods contains about 15 g of carbohydrates. The following list includes serving sizes of carbohydrate-rich foods that provide 15 g ofcarbohydrate per serving:   1 slice of bread (1 oz) or 1 six-inch tortilla.    of a hamburger bun or English muffin.  4-6 crackers.   cup unsweetened dry cereal.  cup hot cereal.   cup rice or pasta.    cup mashed potatoes or  of a large baked potato.  1 cup fresh fruit or one small piece of fruit.    cup canned or frozen fruit or fruit juice.  1 cup milk.   cup plain fat-free yogurt or yogurt sweetened with artificial sweeteners.   cup cooked dried beans or starchy vegetable, such as peas, corn, or potatoes.  Decide the number of standard-size servings that you will eat. Multiply that number of servings by 15 (the grams of carbohydrates in that serving). For example, if you eat 2 cups of strawberries, you will have eaten 2 servings and 30 g of carbohydrates (2 servings x 15 g = 30 g). For foods such as soups and casseroles, in which more than one food is mixed in, you will need to count the carbohydrates in  each food that is included. EXAMPLE OF CARBOHYDRATE COUNTING Sample Dinner  3 oz chicken breast.   cup of brown rice.   cup of corn.  1 cup milk.   1 cup strawberries with sugar-free whipped topping.  Carbohydrate Calculation Step 1: Identify the foods that contain carbohydrates:   Rice.   Corn.   Milk.   Strawberries. Step 2:Calculate the number of servings eaten of each:   2 servings of rice.   1 serving of corn.   1 serving of milk.   1 serving of strawberries. Step 3: Multiply each of those number of servings by 15 g:   2 servings of rice x 15 g = 30 g.   1 serving of corn x 15 g = 15 g.   1 serving of milk x 15 g = 15 g.   1 serving of strawberries x 15 g = 15 g. Step 4: Add together all of the amounts to find the total grams of carbohydrates eaten: 30 g + 15 g + 15 g + 15 g = 75 g. Document Released: 03/12/2005 Document Revised: 07/27/2013 Document Reviewed: 02/06/2013 [facility] Patient Information 2015 [place], [place]. This information is not intended to replace advice given to you by your health care provider. Make sure you discuss any questions you have with your health care provider.

## 2014-05-25 NOTE — Progress Notes (Signed)
Patient presents for BP check and CBG and record review, however, patient did not bring log or meter Med list reviewed; patient reports taking levemir 32 units bid at 0800 and before bed and taking novolog on sliding scale before meals. Patient will bring copy of his sliding scale to next visit Patient reports AM fasting blood sugars ranging 98-380 Patient reports before lunch blood sugars ranging 125-250  Patient reports before dinner blood sugars ranging 220-300 Discussed need for low sodium diet and using Mrs. Dash as alternative to salt Going to [employer] 3 times per week Patient states he quit smoking 03/26/14   BP 110/68 P 82 R 18  T  98.2 oral SPO2 95%  CBG 262 Patient will be leaving here and eating lunch and will take his sliding scale insulin prior  Patient advised to call for med refills at least 7 days before running out so as not to go without.  Patient given literature on DASH Eating Plan Diabetes and Food, Diabetes and Exercise, and Basic Carb Counting  Will discuss CBGs with PCP and call patient with instructions  Patient notified that PCP would like him to begin glucotrol XL 5 mg daily with breakfast. Rx e-scribed to [place]  Patient to return in 3 weeks for nurse visit for CBG and record review. Appt made for 06/15/14 at 1100

## 2014-05-25 NOTE — Telephone Encounter (Signed)
Pt is calling in to f/u with the release that is needed for him to go to the gym for neuropathy. Please f/u with him  Thanks

## 2014-05-26 ENCOUNTER — Telehealth: Payer: Self-pay | Admitting: Cardiovascular Disease

## 2014-05-26 NOTE — Telephone Encounter (Signed)
Pt c/o swelling: STAT is pt has developed SOB within 24 hours  1. How long have you been experiencing swelling? Past couple of days  2. Where is the swelling located? Both legs  3.  Are you currently taking a "fluid pill"?he is not on a fluid pill  4.  Are you currently SOB? no  5.  Have you traveled recently?no

## 2014-05-26 NOTE — Telephone Encounter (Signed)
Spoke with pt, he is upset because he has still not heard about him getting cvlearance to join the gym program. His swelling in his legs is getting worse. He is sleeping with them elevated and that does not help. The gabapentin he is taking is not helping either. He walks everyday and wants to do more. Will forward to dr berry and [place] to follow up with the patient.

## 2014-05-28 NOTE — Telephone Encounter (Signed)
Cleared for his exercise therapy

## 2014-05-28 NOTE — Telephone Encounter (Signed)
Pt called again today,says he needs to hear from somebody. He says he does not want to lose his legs.

## 2014-05-31 NOTE — Telephone Encounter (Signed)
Can this encounter be closed?

## 2014-05-31 NOTE — Telephone Encounter (Signed)
Spoke to patient. He has been anxious to hear news about getting started on this exercise program. I advised him that according to Dr. Kennon Holter note, it is OK for him to proceed. He requested I contact Kat @ [employer] with this info.  I did call [employer], program instructor is not there today. I left message w/ desk requesting callback to our office to speak to myself or [place] about this.

## 2014-06-03 NOTE — Telephone Encounter (Signed)
Left message w/ [employer].

## 2014-06-03 NOTE — Telephone Encounter (Signed)
Letter made and signed by Dr. Gwenlyn Found.  Will call patient.

## 2014-06-03 NOTE — Telephone Encounter (Addendum)
Pt called in stating that something with the company's letterhead on it needs to be emailed to [place] at [employer] stating that Dr. Gwenlyn Found is ok with him moving forward with the exercise program . Please f/u  Thanks

## 2014-06-04 ENCOUNTER — Other Ambulatory Visit: Payer: Self-pay | Admitting: Internal Medicine

## 2014-06-04 NOTE — Telephone Encounter (Signed)
Letter is going to be mailed to pt's residence as requested

## 2014-08-11 ENCOUNTER — Other Ambulatory Visit: Payer: Self-pay | Admitting: Internal Medicine

## 2014-08-17 ENCOUNTER — Telehealth: Payer: Self-pay | Admitting: Internal Medicine

## 2014-08-17 ENCOUNTER — Other Ambulatory Visit: Payer: Self-pay | Admitting: *Deleted

## 2014-08-17 MED ORDER — GABAPENTIN 600 MG PO TABS: ORAL_TABLET | ORAL | Status: DC

## 2014-08-17 NOTE — Telephone Encounter (Signed)
Patient called to request a med refill for Gabapentin 600 mg, Patient uses [facility] on [address]. Please f/u with pt.

## 2014-08-24 ENCOUNTER — Other Ambulatory Visit: Payer: Self-pay | Admitting: Internal Medicine

## 2014-08-24 DIAGNOSIS — E1142 Type 2 diabetes mellitus with diabetic polyneuropathy: Secondary | ICD-10-CM

## 2014-08-24 MED ORDER — GABAPENTIN 600 MG PO TABS: ORAL_TABLET | ORAL | Status: DC

## 2014-08-31 NOTE — Telephone Encounter (Signed)
Informed patient the the refill for his gabapentin was sent to [facility].

## 2014-10-04 ENCOUNTER — Encounter (HOSPITAL_COMMUNITY): Payer: Self-pay | Admitting: Emergency Medicine

## 2014-10-04 ENCOUNTER — Inpatient Hospital Stay (HOSPITAL_COMMUNITY)
Admission: EM | Admit: 2014-10-04 | Discharge: 2014-10-06 | DRG: 065 | Disposition: A | Discharge status: Home / Self Care | Payer: Medicare HMO | Attending: Internal Medicine | Admitting: Internal Medicine

## 2014-10-04 ENCOUNTER — Emergency Department (HOSPITAL_COMMUNITY): Payer: Medicare HMO

## 2014-10-04 DIAGNOSIS — E1151 Type 2 diabetes mellitus with diabetic peripheral angiopathy without gangrene: Secondary | ICD-10-CM | POA: Diagnosis present

## 2014-10-04 DIAGNOSIS — Z8669 Personal history of other diseases of the nervous system and sense organs: Secondary | ICD-10-CM

## 2014-10-04 DIAGNOSIS — I6350 Cerebral infarction due to unspecified occlusion or stenosis of unspecified cerebral artery: Secondary | ICD-10-CM | POA: Diagnosis not present

## 2014-10-04 DIAGNOSIS — I639 Cerebral infarction, unspecified: Secondary | ICD-10-CM | POA: Diagnosis not present

## 2014-10-04 DIAGNOSIS — R42 Dizziness and giddiness: Secondary | ICD-10-CM

## 2014-10-04 DIAGNOSIS — I6340 Cerebral infarction due to embolism of unspecified cerebral artery: Secondary | ICD-10-CM | POA: Diagnosis present

## 2014-10-04 DIAGNOSIS — I5022 Chronic systolic (congestive) heart failure: Secondary | ICD-10-CM | POA: Diagnosis present

## 2014-10-04 DIAGNOSIS — Z794 Long term (current) use of insulin: Secondary | ICD-10-CM

## 2014-10-04 DIAGNOSIS — R200 Anesthesia of skin: Secondary | ICD-10-CM

## 2014-10-04 DIAGNOSIS — R202 Paresthesia of skin: Secondary | ICD-10-CM

## 2014-10-04 DIAGNOSIS — Z791 Long term (current) use of non-steroidal anti-inflammatories (NSAID): Secondary | ICD-10-CM

## 2014-10-04 DIAGNOSIS — E1140 Type 2 diabetes mellitus with diabetic neuropathy, unspecified: Secondary | ICD-10-CM | POA: Diagnosis not present

## 2014-10-04 DIAGNOSIS — R110 Nausea: Secondary | ICD-10-CM

## 2014-10-04 DIAGNOSIS — R51 Headache: Secondary | ICD-10-CM

## 2014-10-04 DIAGNOSIS — E785 Hyperlipidemia, unspecified: Secondary | ICD-10-CM | POA: Diagnosis present

## 2014-10-04 DIAGNOSIS — I63411 Cerebral infarction due to embolism of right middle cerebral artery: Secondary | ICD-10-CM | POA: Diagnosis not present

## 2014-10-04 DIAGNOSIS — Z96653 Presence of artificial knee joint, bilateral: Secondary | ICD-10-CM | POA: Diagnosis present

## 2014-10-04 DIAGNOSIS — Z7982 Long term (current) use of aspirin: Secondary | ICD-10-CM

## 2014-10-04 DIAGNOSIS — Z79899 Other long term (current) drug therapy: Secondary | ICD-10-CM

## 2014-10-04 DIAGNOSIS — Z87891 Personal history of nicotine dependence: Secondary | ICD-10-CM | POA: Diagnosis not present

## 2014-10-04 DIAGNOSIS — E1142 Type 2 diabetes mellitus with diabetic polyneuropathy: Secondary | ICD-10-CM | POA: Diagnosis present

## 2014-10-04 DIAGNOSIS — M549 Dorsalgia, unspecified: Secondary | ICD-10-CM | POA: Diagnosis present

## 2014-10-04 DIAGNOSIS — Z6837 Body mass index (BMI) 37.0-37.9, adult: Secondary | ICD-10-CM

## 2014-10-04 DIAGNOSIS — Z8679 Personal history of other diseases of the circulatory system: Secondary | ICD-10-CM

## 2014-10-04 DIAGNOSIS — G43909 Migraine, unspecified, not intractable, without status migrainosus: Secondary | ICD-10-CM | POA: Diagnosis present

## 2014-10-04 DIAGNOSIS — K219 Gastro-esophageal reflux disease without esophagitis: Secondary | ICD-10-CM | POA: Diagnosis present

## 2014-10-04 DIAGNOSIS — I2510 Atherosclerotic heart disease of native coronary artery without angina pectoris: Secondary | ICD-10-CM | POA: Diagnosis present

## 2014-10-04 DIAGNOSIS — E669 Obesity, unspecified: Secondary | ICD-10-CM | POA: Diagnosis present

## 2014-10-04 DIAGNOSIS — G8929 Other chronic pain: Secondary | ICD-10-CM | POA: Diagnosis present

## 2014-10-04 DIAGNOSIS — I10 Essential (primary) hypertension: Secondary | ICD-10-CM | POA: Diagnosis present

## 2014-10-04 DIAGNOSIS — R079 Chest pain, unspecified: Secondary | ICD-10-CM

## 2014-10-04 DIAGNOSIS — Z8546 Personal history of malignant neoplasm of prostate: Secondary | ICD-10-CM | POA: Diagnosis not present

## 2014-10-04 LAB — COMPREHENSIVE METABOLIC PANEL WITH GFR
ALT: 18 U/L (ref 17–63)
AST: 34 U/L (ref 15–41)
Albumin: 3.3 g/dL — ABNORMAL LOW (ref 3.5–5.0)
Alkaline Phosphatase: 80 U/L (ref 38–126)
Anion gap: 8 (ref 5–15)
BUN: 29 mg/dL — ABNORMAL HIGH (ref 6–20)
CO2: 23 mmol/L (ref 22–32)
Calcium: 8.5 mg/dL — ABNORMAL LOW (ref 8.9–10.3)
Chloride: 101 mmol/L (ref 101–111)
Creatinine, Ser: 0.92 mg/dL (ref 0.61–1.24)
GFR calc Af Amer: >60 mL/min
GFR calc non Af Amer: >60 mL/min
Glucose, Bld: 393 mg/dL — ABNORMAL HIGH (ref 65–99)
Potassium: 5.1 mmol/L (ref 3.5–5.1)
Sodium: 132 mmol/L — ABNORMAL LOW (ref 135–145)
Total Bilirubin: 1.2 mg/dL (ref 0.3–1.2)
Total Protein: 7.8 g/dL (ref 6.5–8.1)

## 2014-10-04 LAB — CBC
HEMATOCRIT: 43.5 % (ref 39.0–52.0)
Hemoglobin: 15.2 g/dL (ref 13.0–17.0)
MCH: 30.9 pg (ref 26.0–34.0)
MCHC: 34.9 g/dL (ref 30.0–36.0)
MCV: 88.4 fL (ref 78.0–100.0)
PLATELETS: 282 10*3/uL (ref 150–400)
RBC: 4.92 MIL/uL (ref 4.22–5.81)
RDW: 12.8 % (ref 11.5–15.5)
WBC: 6.9 10*3/uL (ref 4.0–10.5)

## 2014-10-04 LAB — I-STAT CHEM 8, ED
BUN: 37 mg/dL — ABNORMAL HIGH (ref 6–20)
CALCIUM ION: 1.1 mmol/L — AB (ref 1.12–1.23)
Chloride: 102 mmol/L (ref 101–111)
Creatinine, Ser: 0.9 mg/dL (ref 0.61–1.24)
GLUCOSE: 403 mg/dL — AB (ref 65–99)
HEMATOCRIT: 48 % (ref 39.0–52.0)
Hemoglobin: 16.3 g/dL (ref 13.0–17.0)
Potassium: 4.6 mmol/L (ref 3.5–5.1)
Sodium: 137 mmol/L (ref 135–145)
TCO2: 24 mmol/L (ref 0–100)

## 2014-10-04 LAB — DIFFERENTIAL
Basophils Absolute: 0 K/uL (ref 0.0–0.1)
Basophils Relative: 0 % (ref 0–1)
Eosinophils Absolute: 0.4 K/uL (ref 0.0–0.7)
Eosinophils Relative: 5 % (ref 0–5)
Lymphocytes Relative: 28 % (ref 12–46)
Lymphs Abs: 2 K/uL (ref 0.7–4.0)
Monocytes Absolute: 0.5 K/uL (ref 0.1–1.0)
Monocytes Relative: 8 % (ref 3–12)
Neutro Abs: 4 K/uL (ref 1.7–7.7)
Neutrophils Relative %: 59 % (ref 43–77)

## 2014-10-04 LAB — GLUCOSE, CAPILLARY: GLUCOSE-CAPILLARY: 318 mg/dL — AB (ref 65–99)

## 2014-10-04 LAB — CBG MONITORING, ED: GLUCOSE-CAPILLARY: 379 mg/dL — AB (ref 65–99)

## 2014-10-04 LAB — LIPID PANEL
Cholesterol: 194 mg/dL (ref 0–200)
HDL: 34 mg/dL — ABNORMAL LOW (ref >40)
LDL Cholesterol: 124 mg/dL — ABNORMAL HIGH (ref 0–99)
Total CHOL/HDL Ratio: 5.7 RATIO
Triglycerides: 182 mg/dL — ABNORMAL HIGH (ref <150)
VLDL: 36 mg/dL (ref 0–40)

## 2014-10-04 LAB — TROPONIN I: Troponin I: 0.03 ng/mL (ref <0.031)

## 2014-10-04 LAB — APTT: aPTT: 30 s (ref 24–37)

## 2014-10-04 LAB — PROTIME-INR
INR: 1.15 (ref 0.00–1.49)
Prothrombin Time: 14.9 seconds (ref 11.6–15.2)

## 2014-10-04 LAB — I-STAT TROPONIN, ED: TROPONIN I, POC: 0.01 ng/mL (ref 0.00–0.08)

## 2014-10-04 MED ORDER — ATORVASTATIN CALCIUM 40 MG PO TABS
40.0000 mg | ORAL_TABLET | Freq: Every day | ORAL | Status: DC
  Administered 2014-10-04 – 2014-10-06 (×3): 40 mg via ORAL
  Filled 2014-10-04 (×4): qty 1

## 2014-10-04 MED ORDER — SERTRALINE HCL 50 MG PO TABS
50.0000 mg | ORAL_TABLET | Freq: Every day | ORAL | Status: DC
  Administered 2014-10-04 – 2014-10-06 (×3): 50 mg via ORAL
  Filled 2014-10-04 (×3): qty 1

## 2014-10-04 MED ORDER — TRAMADOL HCL 50 MG PO TABS
50.0000 mg | ORAL_TABLET | Freq: Four times a day (QID) | ORAL | Status: DC | PRN
  Administered 2014-10-05 – 2014-10-06 (×3): 50 mg via ORAL
  Filled 2014-10-04 (×3): qty 1

## 2014-10-04 MED ORDER — MORPHINE SULFATE 4 MG/ML IJ SOLN
4.0000 mg | Freq: Once | INTRAMUSCULAR | Status: AC
  Administered 2014-10-04: 4 mg via INTRAVENOUS
  Filled 2014-10-04: qty 1

## 2014-10-04 MED ORDER — ASPIRIN 325 MG PO TABS
325.0000 mg | ORAL_TABLET | Freq: Once | ORAL | Status: AC
Start: 2014-10-04 — End: 2014-10-04
  Administered 2014-10-04: 325 mg via ORAL
  Filled 2014-10-04: qty 1

## 2014-10-04 MED ORDER — GABAPENTIN 600 MG PO TABS
900.0000 mg | ORAL_TABLET | Freq: Three times a day (TID) | ORAL | Status: DC
  Administered 2014-10-05 – 2014-10-06 (×6): 900 mg via ORAL
  Filled 2014-10-04 (×8): qty 2

## 2014-10-04 MED ORDER — NITROGLYCERIN 0.4 MG SL SUBL: 0.4000 mg | SUBLINGUAL_TABLET | SUBLINGUAL | Status: DC | PRN

## 2014-10-04 MED ORDER — INSULIN ASPART 100 UNIT/ML ~~LOC~~ SOLN
0.0000 [IU] | SUBCUTANEOUS | Status: DC
  Administered 2014-10-04: 11 [IU] via SUBCUTANEOUS
  Administered 2014-10-05: 15 [IU] via SUBCUTANEOUS
  Administered 2014-10-05: 8 [IU] via SUBCUTANEOUS
  Administered 2014-10-05: 3 [IU] via SUBCUTANEOUS
  Administered 2014-10-05: 5 [IU] via SUBCUTANEOUS
  Administered 2014-10-05 (×2): 3 [IU] via SUBCUTANEOUS
  Administered 2014-10-06: 5 [IU] via SUBCUTANEOUS
  Administered 2014-10-06: 3 [IU] via SUBCUTANEOUS
  Administered 2014-10-06: 2 [IU] via SUBCUTANEOUS
  Administered 2014-10-06 (×2): 5 [IU] via SUBCUTANEOUS

## 2014-10-04 MED ORDER — ASPIRIN 300 MG RE SUPP: 300.0000 mg | Freq: Every day | RECTAL | Status: DC

## 2014-10-04 MED ORDER — LISINOPRIL 20 MG PO TABS
20.0000 mg | ORAL_TABLET | Freq: Every day | ORAL | Status: DC
  Administered 2014-10-04 – 2014-10-05 (×2): 20 mg via ORAL
  Filled 2014-10-04 (×2): qty 1

## 2014-10-04 MED ORDER — HEPARIN SODIUM (PORCINE) 5000 UNIT/ML IJ SOLN
5000.0000 [IU] | Freq: Three times a day (TID) | INTRAMUSCULAR | Status: DC
  Administered 2014-10-04 – 2014-10-06 (×6): 5000 [IU] via SUBCUTANEOUS
  Filled 2014-10-04 (×6): qty 1

## 2014-10-04 MED ORDER — INSULIN DETEMIR 100 UNIT/ML ~~LOC~~ SOLN
25.0000 [IU] | Freq: Two times a day (BID) | SUBCUTANEOUS | Status: DC
  Administered 2014-10-04 – 2014-10-06 (×4): 25 [IU] via SUBCUTANEOUS
  Filled 2014-10-04 (×6): qty 0.25

## 2014-10-04 MED ORDER — GABAPENTIN 300 MG PO CAPS
900.0000 mg | ORAL_CAPSULE | Freq: Once | ORAL | Status: AC
  Administered 2014-10-04: 900 mg via ORAL
  Filled 2014-10-04: qty 3

## 2014-10-04 MED ORDER — PANTOPRAZOLE SODIUM 40 MG PO TBEC
40.0000 mg | DELAYED_RELEASE_TABLET | Freq: Every day | ORAL | Status: DC
  Administered 2014-10-04 – 2014-10-06 (×3): 40 mg via ORAL
  Filled 2014-10-04 (×3): qty 1

## 2014-10-04 MED ORDER — METOPROLOL TARTRATE 12.5 MG HALF TABLET
12.5000 mg | ORAL_TABLET | Freq: Every day | ORAL | Status: DC
Start: 2014-10-04 — End: 2014-10-06
  Administered 2014-10-04 – 2014-10-06 (×3): 12.5 mg via ORAL
  Filled 2014-10-04 (×3): qty 1

## 2014-10-04 MED ORDER — ASPIRIN 325 MG PO TABS
325.0000 mg | ORAL_TABLET | Freq: Every day | ORAL | Status: DC
  Administered 2014-10-04 – 2014-10-05 (×2): 325 mg via ORAL
  Filled 2014-10-04 (×2): qty 1

## 2014-10-04 MED ORDER — STROKE: EARLY STAGES OF RECOVERY BOOK
Freq: Once | Status: AC
  Administered 2014-10-04: 21:00:00

## 2014-10-04 MED ORDER — MELOXICAM 7.5 MG PO TABS
7.5000 mg | ORAL_TABLET | Freq: Every day | ORAL | Status: DC
  Administered 2014-10-04 – 2014-10-05 (×2): 7.5 mg via ORAL
  Filled 2014-10-04 (×2): qty 1

## 2014-10-04 MED ORDER — GLIPIZIDE ER 5 MG PO TB24
5.0000 mg | ORAL_TABLET | Freq: Every day | ORAL | Status: DC
  Administered 2014-10-05 – 2014-10-06 (×2): 5 mg via ORAL
  Filled 2014-10-04 (×3): qty 1

## 2014-10-04 MED ORDER — IOHEXOL 350 MG/ML SOLN
100.0000 mL | Freq: Once | INTRAVENOUS | Status: AC | PRN
  Administered 2014-10-04: 100 mL via INTRAVENOUS

## 2014-10-04 NOTE — ED Notes (Signed)
Patient transported to x-ray. ?

## 2014-10-04 NOTE — ED Notes (Signed)
Pt. Stated, I started having left side tingling and numbness 3 days ago. No drift., symetrical smile, equal grips. Alert and oriented x 4 .

## 2014-10-04 NOTE — ED Provider Notes (Signed)
CSN: [id number]     Arrival date & time 10/04/14  1019 History   First MD Initiated Contact with Patient 10/04/14 1124     Chief Complaint  Patient presents with  . Extremity Weakness     (Consider location/radiation/quality/duration/timing/severity/associated sxs/prior Treatment) HPI Comments: Thomas Sosa is a 59 y.o. male with a PMHx of HTN, DM2, diabetic neuropathy, remote prostate CA, HLD, CAD, former tobacco user, and PAD, who presents to the ED with complaints of tingling over the left side of his face and body that began 2 days ago. He states that last week he had a similar episode, but resolved spontaneously the same day, and then 2 days ago it returned and has been constant since then. He additionally complains of left-sided chest pain which he describes as 8/10 sharp constant radiating the left arm, with no known aggravating factors, and unrelieved with Advil. Associated symptoms include a headache which is similar to his migraines, lightheadedness feeling with standing, and nausea. He denies any vision changes, syncope, facial asymmetry, altered mental status, worse finding difficulties, diaphoresis, fever, chills, shortness of breath, cough, leg swelling, recent travel/surgery/immobilization, abdominal pain, vomiting, diarrhea, constipation, obstipation, hematochezia, melena, dysuria, hematuria, numbness, or weakness.  Patient is a 59 y.o. male presenting with extremity weakness. The history is provided by the patient. No language interpreter was used.  Extremity Weakness This is a new problem. The current episode started in the past 7 days. The problem occurs constantly. The problem has been unchanged. Associated symptoms include chest pain (L sided) and headaches. Pertinent negatives include no abdominal pain, arthralgias, chills, diaphoresis, fever, myalgias, nausea, neck pain, numbness, vomiting or weakness. Nothing aggravates the symptoms. He has tried nothing for the symptoms. The  treatment provided no relief.    Past Medical History  Diagnosis Date  . Hypertension   . Diabetes mellitus without complication   . Diabetic neuropathy Dx 2011  . Cancer of prostate Dx 2005  . Hyperlipidemia   . Coronary artery disease   . Tobacco abuse   . Peripheral arterial disease    Past Surgical History  Procedure Laterality Date  . 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 Heart Problems Mother   . Heart Problems Father    History  Substance Use Topics  . Smoking status: Former Smoker    Quit date: 03/26/2014  . Smokeless tobacco: Not on file  . Alcohol Use: No    Review of Systems  Constitutional: Negative for fever, chills and diaphoresis.  Eyes: Negative for visual disturbance.  Respiratory: Negative for shortness of breath.   Cardiovascular: Positive for chest pain (L sided). Negative for leg swelling.  Gastrointestinal: Negative for nausea, vomiting, abdominal pain, diarrhea, constipation and blood in stool.  Genitourinary: Negative for dysuria and hematuria.  Musculoskeletal: Positive for extremity weakness. Negative for myalgias, arthralgias and neck pain.  Skin: Negative for color change.  Allergic/Immunologic: Positive for immunocompromised state (diabetic).  Neurological: Positive for light-headedness (with standing) and headaches. Negative for syncope, facial asymmetry, weakness and numbness.       +tingling in entire L side of face and body  Psychiatric/Behavioral: Negative for confusion.   10 Systems reviewed and are negative for acute change except as noted in the HPI.    Allergies  Review of patient's allergies indicates no known allergies.  Home Medications   Prior to Admission medications   Medication Sig Start Date End Date Taking? Authorizing Provider  aspirin 81 MG tablet Take 81 mg by mouth daily.    Historical Provider, MD  atorvastatin (LIPITOR) 40 MG tablet Take 1 tablet (40 mg  total) by mouth daily. 04/12/14   Lorretta Harp, MD  gabapentin (NEURONTIN) 600 MG tablet take 1 and 1/2 tablets by mouth three times a day 08/24/14   [name], NP  glipiZIDE (GLUCOTROL XL) 5 MG 24 hr tablet Take 1 tablet (5 mg total) by mouth daily with breakfast. 05/25/14   [name], NP  insulin aspart (NOVOLOG) 100 UNIT/ML injection Inject 10 Units into the skin 3 (three) times daily before meals. 01/29/14   Billy Fischer, MD  insulin detemir (LEVEMIR) 100 UNIT/ML injection Inject 0.32 mLs (32 Units total) into the skin at bedtime. Patient taking differently: Inject 32 Units into the skin 2 (two) times daily.  01/29/14   Billy Fischer, MD  lisinopril (PRINIVIL,ZESTRIL) 20 MG tablet Take 1 tablet (20 mg total) by mouth daily. Blood pressure 05/12/14   [name], NP  meloxicam (MOBIC) 7.5 MG tablet Take 1 tablet (7.5 mg total) by mouth daily. Back pain 02/22/14   [name], NP  metoprolol tartrate (LOPRESSOR) 25 MG tablet Take 12.5 mg by mouth daily.     Historical Provider, MD  omeprazole (PRILOSEC) 20 MG capsule Take 1 capsule (20 mg total) by mouth daily. Acid reflux 02/22/14   [name], NP  ONE TOUCH ULTRA TEST test strip TEST four times a day 06/07/14   [name], NP  sertraline (ZOLOFT) 50 MG tablet Take 1 tablet (50 mg total) by mouth daily. For depression 02/22/14   [name], NP  traMADol (ULTRAM) 50 MG tablet Take by mouth every 6 (six) hours as needed.    Historical Provider, MD   BP 158/90 mmHg  Pulse 91  Temp(Src) 97.9 F (36.6 C) (Oral)  Resp 18  Ht 5\' 11"  (1.803 m)  Wt 269 lb 8 oz (122.244 kg)  BMI 37.60 kg/m2  SpO2 92% Physical Exam  Constitutional: He is oriented to person, place, and time. Vital signs are normal. He appears well-developed and well-nourished.  Non-toxic appearance. No distress.  Afebrile, nontoxic, NAD  HENT:  Head: Normocephalic and atraumatic.  Mouth/Throat: Oropharynx is clear and moist and mucous membranes are normal.   No facial asymmetry  Eyes: Conjunctivae and EOM are normal. Pupils are equal, round, and reactive to light. Right eye exhibits no discharge. Left eye exhibits no discharge.  PERRL, EOMI, no nystagmus, no visual field deficits  +Arcus senilis in both eyes  Neck: Normal range of motion. Neck supple. No spinous process tenderness and no muscular tenderness present. No rigidity. Normal range of motion present.  Cardiovascular: Normal rate, regular rhythm, normal heart sounds and intact distal pulses.  Exam reveals no gallop and no friction rub.   No murmur heard. RRR, nl s1/s2, no m/r/g, distal pulses intact, no pedal edema   Pulmonary/Chest: Effort normal and breath sounds normal. No respiratory distress. He has no decreased breath sounds. He has no wheezes. He has no rhonchi. He has no rales. He exhibits no tenderness, no crepitus, no deformity and no retraction.  CTAB in all lung fields, no w/r/r, no hypoxia or increased WOB, speaking in full sentences, SpO2 92% on RA No chest wall TTP, no crepitus or deformity  Abdominal: Soft. Normal appearance and bowel sounds are normal. He exhibits no distension. There is no tenderness. There is no rigidity, no rebound,  no guarding, no CVA tenderness, no tenderness at McBurney's point and negative Murphy's sign.  Musculoskeletal: Normal range of motion.  MAE x4 Strength and sensation grossly intact at baseline (chronic BLE diminished sensation which pt states is chronic and unchanged) Distal pulses intact No pedal edema Gait steady  Neurological: He is alert and oriented to person, place, and time. He has normal strength. No cranial nerve deficit or sensory deficit. He displays a negative Romberg sign. Coordination and gait normal. GCS eye subscore is 4. GCS verbal subscore is 5. GCS motor subscore is 6.  CN 2-12 grossly intact A&O x4 GCS 15 Sensation and strength intact at baseline (as noted above) Gait nonataxic including with tandem  walking Coordination with finger-to-nose WNL Neg romberg, neg pronator drift   Skin: Skin is warm, dry and intact. No rash noted.  Psychiatric: He has a normal mood and affect.  Nursing note and vitals reviewed.   ED Course  Procedures (including critical care time) Labs Review Labs Reviewed  COMPREHENSIVE METABOLIC PANEL - Abnormal; Notable for the following:    Sodium 132 (*)    Glucose, Bld 393 (*)    BUN 29 (*)    Calcium 8.5 (*)    Albumin 3.3 (*)    All other components within normal limits  CBG MONITORING, ED - Abnormal; Notable for the following:    Glucose-Capillary 379 (*)    All other components within normal limits  I-STAT CHEM 8, ED - Abnormal; Notable for the following:    BUN 37 (*)    Glucose, Bld 403 (*)    Calcium, Ion 1.10 (*)    All other components within normal limits  PROTIME-INR  APTT  CBC  DIFFERENTIAL  I-STAT TROPOININ, ED    Imaging Review Dg Chest 2 View  10/04/2014   CLINICAL DATA:  Shortness of breath and cough  EXAM: CHEST - 2 VIEW  COMPARISON:  None.  FINDINGS: Postsurgical changes are noted. The cardiac shadow is within normal limits. The lungs are clear bilaterally. No acute bony abnormality is noted.  IMPRESSION: No active disease.   Electronically Signed   By: Inez Catalina M.D.   On: 10/04/2014 13:03   Ct Angio Chest Aorta W/cm &/or Wo/cm  10/04/2014   CLINICAL DATA:  59 year old male with left-sided chest pain and tingling  EXAM: CT ANGIOGRAPHY CHEST WITH CONTRAST  TECHNIQUE: Multidetector CT imaging of the chest was performed using the standard protocol during bolus administration of intravenous contrast. Multiplanar CT image reconstructions and MIPs were obtained to evaluate the vascular anatomy.  CONTRAST:  119mL OMNIPAQUE IOHEXOL 350 MG/ML SOLN  COMPARISON:  Chest x-ray obtained earlier today  FINDINGS: Mediastinum: Unremarkable CT appearance of the thyroid gland. No suspicious mediastinal or hilar adenopathy. No soft tissue mediastinal  mass. The thoracic esophagus is unremarkable.  Heart/Vascular: On the initial unenhanced images, there is no evidence of crescentic high attenuation in the vascular media to suggest acute intramural hematoma. Following a opacification with intravenous contrast, there is no evidence of dissection or aneurysm. Conventional 3 vessel arch anatomy. Scant atherosclerotic vascular calcifications without evidence of stenosis. Normal caliber main and central pulmonary arteries without evidence of central PE. There is cardiomegaly with left ventricular dilatation. Extensive calcifications are present throughout the coronary arteries. Post surgical changes suggest prior 2 vessel CABG. Aortic to distal RCA graft opacifies with contrast material as does the aorta to mid to distal LAD.  Lungs/Pleura: Trace dependent atelectasis. Otherwise, the lungs are clear.  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 No acute fracture or aggressive appearing lytic or blastic osseous lesion. Multilevel degenerative disc disease.  Upper Abdomen: Visualized upper abdominal organs are unremarkable.  Review of the MIP images confirms the above findings.  IMPRESSION: 1. No evidence of acute aortic dissection, aneurysm or other acute vascular abnormality. 2. Cardiomegaly with left ventricular dilatation. 3. Advanced calcification of the coronary arteries. Post surgical changes of prior multivessel CABG. Although evaluation is limited by non cardiac gated technique, the proximal aspect of the bypass grafts opacify with contrast material suggesting patency.   Electronically Signed   By: Jacqulynn Cadet M.D.   On: 10/04/2014 15:04     EKG Interpretation   Date/Time:  Monday October 04 2014 10:48:49 EDT Ventricular Rate:  89 PR Interval:  156 QRS Duration: 112 QT Interval:  384 QTC Calculation: 467 R Axis:   61 Text Interpretation:  Normal sinus  rhythm ST \\T \ T wave abnormality,  consider inferior ischemia Prolonged QT Abnormal ECG No old tracing to  compare Confirmed by BELFI  MD, MELANIE ([id number]) on 10/04/2014 3:31:38 PM      MDM   Final diagnoses:  Chest pain  Tingling of left upper extremity and left side of face  Tingling sensation  History of peripheral arterial disease  History of peripheral neuropathy  Nausea  Chronic nonintractable headache, unspecified headache type  Postural lightheadedness    59 y.o. male here with 2 days of L sided tingling, no other neuro deficits. Occurred last week then recurred 2 days ago. No focal neuro deficits on exam. Also complains of CP with lightheadedness and nausea x2 days. Additionally states he has a HA which is the same as his migraines. Will get labs and EKG, CXR, give ASA, NTG, morphine, and likely get MRI but Dr. Tamera Punt would like to evaluate pt prior to ordering MRI. Will reassess shortly.   12:49 PM Dr. Tamera Punt in to see pt, would like to proceed with CTA to r/o dissection first, then if negative consider neuro consult. So far, trop WNL, gluc 379, INR WNL, APTT WNL, CBC w/diff unremarkable, and CMP showing Na 132 which corrects with glucose which is 393 without anion gap. BUN 29. CXR still pending. EKG showing NSR with some ST/T changes and long QT, no old EKGs for comparison. Will reassess shortly.   3:08 PM CXR clear. CT Chest without evidence of dissection/aneurysm, or other acute findings. Will consult neuro to get their input.  3:11 PM Dr. Doy Mince of neurology returning page, will consult on pt now. Will await her recommendations. Pt states overall his CP is improved after ASA and morphine but he still has tightness feeling in his L arm, also has the ongoing tingling but it has subsided some since he's arrived.  3:52 PM Still awaiting on neuro consult. Will sign care out to Noemi Chapel MD at shift change who will f/up with neuro consult and consider admission for CP r/o.  Please see his note for further documentation of care.   BP 150/89 mmHg  Pulse 80  Temp(Src) 98.3 F (36.8 C) (Oral)  Resp 21  Ht 5\' 11"  (1.803 m)  Wt 269 lb 8 oz (122.244 kg)  BMI 37.60 kg/m2  SpO2 100%  Meds ordered this encounter  Medications  . aspirin tablet 325 mg    Sig:   . morphine 4 MG/ML injection 4 mg    Sig:   .  iohexol (OMNIPAQUE) 350 MG/ML injection 100 mL    Sig:      Dejon Lukas Camprubi-Soms, PA-C 10/04/14 1555  Noemi Chapel, MD 10/04/14 1904

## 2014-10-04 NOTE — H&P (Signed)
[facility] History and Physical  Thomas Sosa QPY:[id number] DOB: 1955/12/02 DOA: 10/04/2014  Referring physician: Zacarias Pontes PA PCP: [name], NP   Chief Complaint: Weakness of Extremities  HPI: Thomas Sosa is a 59 y.o. male with history of HTN DM II Hyperlipidemia presents with c/o tingling over the left side of his Face and body. Patient states that the symptoms started no Saturday. Patient initially developed Left sided numbness involving the face but appears did not get medical help. Patient states that symptoms appeared to spread to involve the upper extremity as well as his leg. Associated with this was noted some weakness. This is not the first that he has had this numbness. Patient states that this time however the symptoms have not resolved.He also had some chest pain noted which was associated with radiation to the arm and also some nausea and dizziness. No visual disturbance noted. He had no syncope no vomiting noted.   Review of Systems:  Complete ROS performed and is unremarkable other than noted above in HPI.   Past Medical History  Diagnosis Date  . Hypertension   . Diabetes mellitus without complication   . Diabetic neuropathy Dx 2011  . Cancer of prostate Dx 2005  . Hyperlipidemia   . Coronary artery disease   . Tobacco abuse   . Peripheral arterial disease    Past Surgical History  Procedure Laterality Date  . Replacement total knee  Lt 2013/ Rt 2005   Social History:  reports that he quit smoking about 6 months ago. He does not have any smokeless tobacco history on file. He reports that he does not drink alcohol. His drug history is not on file.  No Known Allergies  Family History  Problem Relation Age of Onset  . Heart Problems Mother   . Heart Problems Father      Prior to Admission medications   Medication Sig Start Date End Date Taking? Authorizing Provider  aspirin 81 MG tablet Take 81 mg by mouth daily.   Yes Historical  Provider, MD  atorvastatin (LIPITOR) 40 MG tablet Take 1 tablet (40 mg total) by mouth daily. 04/12/14  Yes Lorretta Harp, MD  gabapentin (NEURONTIN) 600 MG tablet take 1 and 1/2 tablets by mouth three times a day 08/24/14  Yes [name], NP  glipiZIDE (GLUCOTROL XL) 5 MG 24 hr tablet Take 1 tablet (5 mg total) by mouth daily with breakfast. 05/25/14  Yes [name], NP  insulin aspart (NOVOLOG) 100 UNIT/ML injection Inject 10 Units into the skin 3 (three) times daily before meals. 01/29/14  Yes Billy Fischer, MD  insulin detemir (LEVEMIR) 100 UNIT/ML injection Inject 0.32 mLs (32 Units total) into the skin at bedtime. Patient taking differently: Inject 25 Units into the skin 2 (two) times daily.  01/29/14  Yes Billy Fischer, MD  lisinopril (PRINIVIL,ZESTRIL) 20 MG tablet Take 1 tablet (20 mg total) by mouth daily. Blood pressure 05/12/14  Yes [name], NP  meloxicam (MOBIC) 7.5 MG tablet Take 1 tablet (7.5 mg total) by mouth daily. Back pain 02/22/14  Yes [name], NP  omeprazole (PRILOSEC) 20 MG capsule Take 1 capsule (20 mg total) by mouth daily. Acid reflux 02/22/14  Yes [name], NP  sertraline (ZOLOFT) 50 MG tablet Take 1 tablet (50 mg total) by mouth daily. For depression 02/22/14  Yes [name], NP  traMADol (ULTRAM) 50 MG tablet Take by mouth every 6 (six) hours as  needed.   Yes Historical Provider, MD  metoprolol tartrate (LOPRESSOR) 25 MG tablet Take 12.5 mg by mouth daily.     Historical Provider, MD  ONE TOUCH ULTRA TEST test strip TEST four times a day 06/07/14   [name], NP   Physical Exam: Filed Vitals:   10/04/14 1500 10/04/14 1525 10/04/14 1700 10/04/14 1817  BP: 143/90 150/89 145/85 136/78  Pulse: 83 80 85 83  Temp:      TempSrc:      Resp: 13 21 23 18   Height:      Weight:      SpO2: 97% 100% 100% 96%    Wt Readings from Last 3 Encounters:  10/04/14 122.244 kg (269 lb 8 oz)  05/12/14 114.306 kg (252 lb)  04/09/14 107.956 kg (238 lb)     General:  Appears calm and comfortable Eyes: PERRL, normal lids, irises & conjunctiva ENT: grossly normal hearing, lips & tongue Neck: no LAD, masses or thyromegaly Cardiovascular: RRR, no m/r/g. No LE edema.  Respiratory: CTA bilaterally, no w/r/r. Abdomen: soft, ntnd Skin: no rash or induration seen on limited exam Musculoskeletal: grossly normal tone BUE/BLE Psychiatric: grossly normal mood and affect Neurologic: grossly left sided weakness LE>UE gait was not checked          Labs on Admission:  Basic Metabolic Panel:  Recent Labs Lab 10/04/14 1100 10/04/14 1204  NA 132* 137  K 5.1 4.6  CL 101 102  CO2 23  --   GLUCOSE 393* 403*  BUN 29* 37*  CREATININE 0.92 0.90  CALCIUM 8.5*  --    Liver Function Tests:  Recent Labs Lab 10/04/14 1100  AST 34  ALT 18  ALKPHOS 80  BILITOT 1.2  PROT 7.8  ALBUMIN 3.3*   No results for input(s): LIPASE, AMYLASE in the last 168 hours. No results for input(s): AMMONIA in the last 168 hours. CBC:  Recent Labs Lab 10/04/14 1100 10/04/14 1204  WBC 6.9  --   NEUTROABS 4.0  --   HGB 15.2 16.3  HCT 43.5 48.0  MCV 88.4  --   PLT 282  --    Cardiac Enzymes: No results for input(s): CKTOTAL, CKMB, CKMBINDEX, TROPONINI in the last 168 hours.  BNP (last 3 results) No results for input(s): BNP in the last 8760 hours.  ProBNP (last 3 results) No results for input(s): PROBNP in the last 8760 hours.  CBG:  Recent Labs Lab 10/04/14 1118  GLUCAP 379*    Radiological Exams on Admission: Dg Chest 2 View  10/04/2014   CLINICAL DATA:  Shortness of breath and cough  EXAM: CHEST - 2 VIEW  COMPARISON:  None.  FINDINGS: Postsurgical changes are noted. The cardiac shadow is within normal limits. The lungs are clear bilaterally. No acute bony abnormality is noted.  IMPRESSION: No active disease.   Electronically Signed   By: Inez Catalina M.D.   On: 10/04/2014 13:03   Mr Brain Wo Contrast  10/04/2014   CLINICAL DATA:   59 year old male with left side numbness and weakness for 2 days. Unresolved symptoms. Initial encounter.  EXAM: MRI HEAD WITHOUT CONTRAST  TECHNIQUE: Multiplanar, multiecho pulse sequences of the brain and surrounding structures were obtained without intravenous contrast.  COMPARISON:  None.  FINDINGS: Clustered small foci of abnormal trace diffusion in the right peri-Rolandic cortex and subcortical white matter near the right motor strip (series 9, image 13). The most confluent foci do appear mildly restricted on ADC (series 300, image  38). Mild T2 and FLAIR hyperintensity associated. There is evidence of associated petechial hemorrhage (series 7, image 86), but no mass effect.  There are also several small foci of similar abnormal trace diffusion in the right occipital pole involving both cortex and white matter (series 3 images 19-22).  No posterior fossa or contralateral left hemisphere diffusion abnormality.  Major intracranial vascular flow voids are preserved, there is a mild degree of intracranial artery dolichoectasia.  Patchy bilateral cerebral white matter T2 and FLAIR hyper intensity, with areas which most resemble small chronic white matter lacunar infarcts (series 6, image 17). No definite cortical encephalomalacia. Occasional chronic micro hemorrhages in the brain suggested on susceptibility weighted imaging. Deep gray matter nuclei are within normal limits. Mild nonspecific patchy T2 hyperintensity in the pons. Small chronic right cerebellar lacunar infarct on series 5, image 6.  No midline shift, mass effect, evidence of mass lesion, ventriculomegaly, extra-axial collection or acute intracranial hemorrhage. Cervicomedullary junction and pituitary are within normal limits. Visible internal auditory structures appear normal. Mastoids are clear. Moderate paranasal sinus mucosal thickening. Visualized orbit soft tissues are within normal limits. Scalp soft tissues are within normal limits. Visualized  bone marrow signal is within normal limits. Negative for age visualized cervical spine.  IMPRESSION: 1. Small subacute appearing infarcts in the right peri-Rolandic cortex and subcortical white matter. Similar small infarcts in the right occipital pole. Minimal petechial hemorrhage with no mass effect. 2. Evidence of underlying chronic small vessel ischemia in both cerebral hemispheres, and the right cerebellum.   Electronically Signed   By: Genevie Ann M.D.   On: 10/04/2014 18:18   Ct Angio Chest Aorta W/cm &/or Wo/cm  10/04/2014   CLINICAL DATA:  59 year old male with left-sided chest pain and tingling  EXAM: CT ANGIOGRAPHY CHEST WITH CONTRAST  TECHNIQUE: Multidetector CT imaging of the chest was performed using the standard protocol during bolus administration of intravenous contrast. Multiplanar CT image reconstructions and MIPs were obtained to evaluate the vascular anatomy.  CONTRAST:  158mL OMNIPAQUE IOHEXOL 350 MG/ML SOLN  COMPARISON:  Chest x-ray obtained earlier today  FINDINGS: Mediastinum: Unremarkable CT appearance of the thyroid gland. No suspicious mediastinal or hilar adenopathy. No soft tissue mediastinal mass. The thoracic esophagus is unremarkable.  Heart/Vascular: On the initial unenhanced images, there is no evidence of crescentic high attenuation in the vascular media to suggest acute intramural hematoma. Following a opacification with intravenous contrast, there is no evidence of dissection or aneurysm. Conventional 3 vessel arch anatomy. Scant atherosclerotic vascular calcifications without evidence of stenosis. Normal caliber main and central pulmonary arteries without evidence of central PE. There is cardiomegaly with left ventricular dilatation. Extensive calcifications are present throughout the coronary arteries. Post surgical changes suggest prior 2 vessel CABG. Aortic to distal RCA graft opacifies with contrast material as does the aorta to mid to distal LAD.  Lungs/Pleura: Trace  dependent atelectasis. Otherwise, the lungs are clear.  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 No acute fracture or aggressive appearing lytic or blastic osseous lesion. Multilevel degenerative disc disease.  Upper Abdomen: Visualized upper abdominal organs are unremarkable.  Review of the MIP images confirms the above findings.  IMPRESSION: 1. No evidence of acute aortic dissection, aneurysm or other acute vascular abnormality. 2. Cardiomegaly with left ventricular dilatation. 3. Advanced calcification of the coronary arteries. Post surgical changes of prior multivessel CABG. Although evaluation is limited by non cardiac gated technique, the proximal  aspect of the bypass grafts opacify with contrast material suggesting patency.   Electronically Signed   By: Jacqulynn Cadet M.D.   On: 10/04/2014 15:04     Assessment/Plan Principal Problem:   Cerebral infarction due to embolism of right middle cerebral artery Active Problems:   HTN (hypertension)   Hyperlipidemia   Type 2 diabetes mellitus with peripheral neuropathy   1. Cerebral Infarct Involving the Right Cerebral Cortex Peri-Rolandic Area -Patient had MRI in ED confirming the diagnosis\ -will get Echo in AM -will get carotid doppler in am -ASA ordered -Neurology follow up -RN Stroke swallow evaluation -Neuro Checks as ordered -PT/OT consult ordered -A1C and Lipid Panel\  2. HTN -will continue with lopressor -monitor pressures  3. Hyperlipidemia -will continue with statins  4. DM II with peripheral neuropathy -will continue with glipizide and Levemir -check FSBS -will get a A1C -SSI as needed  5. GERD -will continue with PPI  6. Chronic Back Pain -on meloxicam and tramadol  7. Chest Pain -Atypical symptoms  -will get serial Enzymes  Code Status: Full Code (must indicate code status--if unknown or must be presumed,  indicate so) DVT Prophylaxis:Heparin Family Communication: None (indicate person spoken with, if applicable, with phone number if by telephone) Disposition Plan: Home (indicate anticipated LOS)  Time spent: 43min  KHAN,SAADAT A [facility] Pager [phone]

## 2014-10-04 NOTE — Consult Note (Signed)
Referring Physician: Tamera Punt    Chief Complaint: Left sided numbness and weakness  HPI: Thomas Sosa is an 59 y.o. male who reports that he was watching television on Saturday (10/02/14) and had the acute onset of elft sided numbness.  He noted the numbness initially on his face.  It moved downward to include his upper extremity, front of his trunk, back and then leg.  He at that time noted some weakness as well.  The patient had a similar episode on 7/5.  This episode resolved spontaneously after a few hours.  With no resolution of his symptoms on this occasion he presented for evaluation.    Date last known well: Date: 10/02/2014 Time last known well: Time: 12:00 tPA Given: No: Outside time window  Past Medical History  Diagnosis Date  . Hypertension   . Diabetes mellitus without complication   . Diabetic neuropathy Dx 2011  . Cancer of prostate Dx 2005  . Hyperlipidemia   . Coronary artery disease   . Tobacco abuse   . Peripheral arterial disease     Past Surgical History  Procedure Laterality Date  . Replacement total knee  Lt 2013/ Rt 2005    Family History  Problem Relation Age of Onset  . Heart Problems Mother   . Heart Problems Father    Social History:  reports that he quit smoking about 6 months ago. He does not have any smokeless tobacco history on file. He reports that he does not drink alcohol. His drug history is not on file.  Allergies: No Known Allergies  Medications: I have reviewed the patient's current medications. Prior to Admission:  Current outpatient prescriptions:  .  aspirin 81 MG tablet, Take 81 mg by mouth daily., Disp: , Rfl:  .  atorvastatin (LIPITOR) 40 MG tablet, Take 1 tablet (40 mg total) by mouth daily., Disp: 30 tablet, Rfl: 6 .  gabapentin (NEURONTIN) 600 MG tablet, take 1 and 1/2 tablets by mouth three times a day, Disp: 135 tablet, Rfl: 1 .  glipiZIDE (GLUCOTROL XL) 5 MG 24 hr tablet, Take 1 tablet (5 mg total) by mouth daily with  breakfast., Disp: 30 tablet, Rfl: 2 .  insulin aspart (NOVOLOG) 100 UNIT/ML injection, Inject 10 Units into the skin 3 (three) times daily before meals., Disp: 10 mL, Rfl: 11 .  insulin detemir (LEVEMIR) 100 UNIT/ML injection, Inject 0.32 mLs (32 Units total) into the skin at bedtime. (Patient taking differently: Inject 25 Units into the skin 2 (two) times daily. ), Disp: 10 mL, Rfl: 11 .  lisinopril (PRINIVIL,ZESTRIL) 20 MG tablet, Take 1 tablet (20 mg total) by mouth daily. Blood pressure, Disp: 30 tablet, Rfl: 4 .  meloxicam (MOBIC) 7.5 MG tablet, Take 1 tablet (7.5 mg total) by mouth daily. Back pain, Disp: 30 tablet, Rfl: 1 .  omeprazole (PRILOSEC) 20 MG capsule, Take 1 capsule (20 mg total) by mouth daily. Acid reflux, Disp: 30 capsule, Rfl: 4 .  sertraline (ZOLOFT) 50 MG tablet, Take 1 tablet (50 mg total) by mouth daily. For depression, Disp: 30 tablet, Rfl: 3 .  traMADol (ULTRAM) 50 MG tablet, Take by mouth every 6 (six) hours as needed., Disp: , Rfl:  .  metoprolol tartrate (LOPRESSOR) 25 MG tablet, Take 12.5 mg by mouth daily. , Disp: , Rfl:  .  ONE TOUCH ULTRA TEST test strip, TEST four times a day, Disp: 100 each, Rfl: 6  ROS: History obtained from the patient  General ROS: negative for - chills, fatigue,  fever, night sweats, weight gain or weight loss Psychological ROS: negative for - behavioral disorder, hallucinations, memory difficulties, mood swings or suicidal ideation Ophthalmic ROS: negative for - blurry vision, double vision, eye pain or loss of vision ENT ROS: poor dentition Allergy and Immunology ROS: negative for - hives or itchy/watery eyes Hematological and Lymphatic ROS: negative for - bleeding problems, bruising or swollen lymph nodes Endocrine ROS: negative for - galactorrhea, hair pattern changes, polydipsia/polyuria or temperature intolerance Respiratory ROS: negative for - cough, hemoptysis, shortness of breath or wheezing Cardiovascular ROS: negative for -  chest pain, dyspnea on exertion, edema or irregular heartbeat Gastrointestinal ROS: negative for - abdominal pain, diarrhea, hematemesis, nausea/vomiting or stool incontinence Genito-Urinary ROS: negative for - dysuria, hematuria, incontinence or urinary frequency/urgency Musculoskeletal ROS: feet feel like ice Neurological ROS: as noted in HPI Dermatological ROS: negative for rash and skin lesion changes  Physical Examination: Blood pressure 150/89, pulse 80, temperature 98.3 F (36.8 C), temperature source Oral, resp. rate 21, height 5\' 11"  (1.803 m), weight 122.244 kg (269 lb 8 oz), SpO2 100 %.  HEENT-  Normocephalic, no lesions, without obvious abnormality.  Normal external eye and conjunctiva.  Normal TM's bilaterally.  Normal auditory canals and external ears. Normal external nose, mucus membranes and septum.  Normal pharynx. Cardiovascular- S1, S2 normal, pulses palpable throughout   Lungs- chest clear, no wheezing, rales, normal symmetric air entry Abdomen- soft, non-tender; bowel sounds normal; no masses,  no organomegaly Extremities- no edema Lymph-no adenopathy palpable Musculoskeletal-no joint tenderness, deformity or swelling Skin-warm and dry, no hyperpigmentation, vitiligo, or suspicious lesions  Neurological Examination Mental Status: Alert, oriented, thought content appropriate.  Speech fluent without evidence of aphasia.  Able to follow 3 step commands without difficulty. Cranial Nerves: II: Discs flat bilaterally; Visual fields grossly normal, pupils equal, round, reactive to light and accommodation III,IV, VI: ptosis not present, extra-ocular motions intact bilaterally V,VII: smile symmetric, facial light touch sensation decreased on the left VIII: hearing normal bilaterally IX,X: gag reflex present XI: bilateral shoulder shrug XII: leftward tongue extension Motor: Right : Upper extremity   5/5    Left:     Upper extremity   5-/5  Lower extremity   5/5     Lower  extremity   5-/5 with 5/5 strength distally Tone and bulk:normal tone throughout; no atrophy noted Sensory: Pinprick and light touch decreased on the left Deep Tendon Reflexes: 2+ in the upper extremities and absent in the lower extremities Plantars: Right: mute   Left: mute Cerebellar: normal finger-to-nose and normal heel-to-shin testing bilaterally Gait: not tested due to safety concerns    Laboratory Studies:  Basic Metabolic Panel:  Recent Labs Lab 10/04/14 1100 10/04/14 1204  NA 132* 137  K 5.1 4.6  CL 101 102  CO2 23  --   GLUCOSE 393* 403*  BUN 29* 37*  CREATININE 0.92 0.90  CALCIUM 8.5*  --     Liver Function Tests:  Recent Labs Lab 10/04/14 1100  AST 34  ALT 18  ALKPHOS 80  BILITOT 1.2  PROT 7.8  ALBUMIN 3.3*   No results for input(s): LIPASE, AMYLASE in the last 168 hours. No results for input(s): AMMONIA in the last 168 hours.  CBC:  Recent Labs Lab 10/04/14 1100 10/04/14 1204  WBC 6.9  --   NEUTROABS 4.0  --   HGB 15.2 16.3  HCT 43.5 48.0  MCV 88.4  --   PLT 282  --     Cardiac Enzymes:  No results for input(s): CKTOTAL, CKMB, CKMBINDEX, TROPONINI in the last 168 hours.  BNP: Invalid input(s): POCBNP  CBG:  Recent Labs Lab 10/04/14 1118  GLUCAP 379*    Microbiology: No results found for this or any previous visit.  Coagulation Studies:  Recent Labs  10/04/14 1100  LABPROT 14.9  INR 1.15    Urinalysis: No results for input(s): COLORURINE, LABSPEC, PHURINE, GLUCOSEU, HGBUR, BILIRUBINUR, KETONESUR, PROTEINUR, UROBILINOGEN, NITRITE, LEUKOCYTESUR in the last 168 hours.  Invalid input(s): APPERANCEUR  Lipid Panel:    Component Value Date/Time   CHOL 160 04/09/2014 1032   TRIG 100 04/09/2014 1032   HDL 34* 04/09/2014 1032   CHOLHDL 4.7 04/09/2014 1032   VLDL 20 04/09/2014 1032   LDLCALC 106* 04/09/2014 1032    HgbA1C:  Lab Results  Component Value Date   HGBA1C 12.40 05/12/2014    Urine Drug Screen:  No  results found for: LABOPIA, COCAINSCRNUR, LABBENZ, AMPHETMU, THCU, LABBARB  Alcohol Level: No results for input(s): ETH in the last 168 hours.  Other results: EKG: normal sinus rhythm at 89 bpm.  Imaging: Dg Chest 2 View  10/04/2014   CLINICAL DATA:  Shortness of breath and cough  EXAM: CHEST - 2 VIEW  COMPARISON:  None.  FINDINGS: Postsurgical changes are noted. The cardiac shadow is within normal limits. The lungs are clear bilaterally. No acute bony abnormality is noted.  IMPRESSION: No active disease.   Electronically Signed   By: Inez Catalina M.D.   On: 10/04/2014 13:03   Ct Angio Chest Aorta W/cm &/or Wo/cm  10/04/2014   CLINICAL DATA:  59 year old male with left-sided chest pain and tingling  EXAM: CT ANGIOGRAPHY CHEST WITH CONTRAST  TECHNIQUE: Multidetector CT imaging of the chest was performed using the standard protocol during bolus administration of intravenous contrast. Multiplanar CT image reconstructions and MIPs were obtained to evaluate the vascular anatomy.  CONTRAST:  143mL OMNIPAQUE IOHEXOL 350 MG/ML SOLN  COMPARISON:  Chest x-ray obtained earlier today  FINDINGS: Mediastinum: Unremarkable CT appearance of the thyroid gland. No suspicious mediastinal or hilar adenopathy. No soft tissue mediastinal mass. The thoracic esophagus is unremarkable.  Heart/Vascular: On the initial unenhanced images, there is no evidence of crescentic high attenuation in the vascular media to suggest acute intramural hematoma. Following a opacification with intravenous contrast, there is no evidence of dissection or aneurysm. Conventional 3 vessel arch anatomy. Scant atherosclerotic vascular calcifications without evidence of stenosis. Normal caliber main and central pulmonary arteries without evidence of central PE. There is cardiomegaly with left ventricular dilatation. Extensive calcifications are present throughout the coronary arteries. Post surgical changes suggest prior 2 vessel CABG. Aortic to distal  RCA graft opacifies with contrast material as does the aorta to mid to distal LAD.  Lungs/Pleura: Trace dependent atelectasis. Otherwise, the lungs are clear.  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 No acute fracture or aggressive appearing lytic or blastic osseous lesion. Multilevel degenerative disc disease.  Upper Abdomen: Visualized upper abdominal organs are unremarkable.  Review of the MIP images confirms the above findings.  IMPRESSION: 1. No evidence of acute aortic dissection, aneurysm or other acute vascular abnormality. 2. Cardiomegaly with left ventricular dilatation. 3. Advanced calcification of the coronary arteries. Post surgical changes of prior multivessel CABG. Although evaluation is limited by non cardiac gated technique, the proximal aspect of the bypass grafts opacify with contrast material suggesting patency.   Electronically Signed  By: Jacqulynn Cadet M.D.   On: 10/04/2014 15:04    Assessment: 59 y.o. male presenting with acute onset left sided numbness.  Symptoms have now been present for two days therefore patient is not a tPA candidate.  Patient with multiple vascular risk factors that are not well controlled.  A small lacunar event is in the differential.  Further work up recommended.    Stroke Risk Factors - diabetes mellitus, hyperlipidemia and hypertension  Plan: 1. HgbA1c, fasting lipid panel 2. MRI, MRA  of the brain without contrast 3. PT consult, OT consult, Speech consult 4. Echocardiogram 5. Carotid dopplers 6. Prophylactic therapy-Antiplatelet med: Aspirin - dose 325mg  daily 7. NPO until RN stroke swallow screen 8. Telemetry monitoring 9. Frequent neuro checks   Alexis Goodell, MD [facility] [phone] 10/04/2014, 5:22 PM

## 2014-10-04 NOTE — Progress Notes (Signed)
Pt arrived to room 4N15 from ED. Pt is alert and oriented.  Safety measures in place and tele applied. Will continue to monitor.   Fredrich Romans, RN

## 2014-10-05 ENCOUNTER — Ambulatory Visit (HOSPITAL_COMMUNITY): Payer: Medicare HMO

## 2014-10-05 DIAGNOSIS — I639 Cerebral infarction, unspecified: Secondary | ICD-10-CM

## 2014-10-05 LAB — GLUCOSE, CAPILLARY
GLUCOSE-CAPILLARY: 157 mg/dL — AB (ref 65–99)
GLUCOSE-CAPILLARY: 272 mg/dL — AB (ref 65–99)
Glucose-Capillary: 194 mg/dL — ABNORMAL HIGH (ref 65–99)
Glucose-Capillary: 222 mg/dL — ABNORMAL HIGH (ref 65–99)
Glucose-Capillary: 234 mg/dL — ABNORMAL HIGH (ref 65–99)
Glucose-Capillary: 381 mg/dL — ABNORMAL HIGH (ref 65–99)

## 2014-10-05 LAB — LIPID PANEL
CHOLESTEROL: 179 mg/dL (ref 0–200)
HDL: 28 mg/dL — ABNORMAL LOW (ref >40)
LDL CALC: 100 mg/dL — AB (ref 0–99)
Total CHOL/HDL Ratio: 6.4 RATIO
Triglycerides: 253 mg/dL — ABNORMAL HIGH (ref <150)
VLDL: 51 mg/dL — ABNORMAL HIGH (ref 0–40)

## 2014-10-05 LAB — MAGNESIUM: Magnesium: 1.9 mg/dL (ref 1.7–2.4)

## 2014-10-05 LAB — HEMOGLOBIN A1C
Hgb A1c MFr Bld: 12.1 % — ABNORMAL HIGH (ref 4.8–5.6)
MEAN PLASMA GLUCOSE: 301 mg/dL

## 2014-10-05 MED ORDER — CLOPIDOGREL BISULFATE 75 MG PO TABS
75.0000 mg | ORAL_TABLET | Freq: Every day | ORAL | Status: DC
  Administered 2014-10-05 – 2014-10-06 (×2): 75 mg via ORAL
  Filled 2014-10-05 (×2): qty 1

## 2014-10-05 MED ORDER — HYDROCODONE-ACETAMINOPHEN 5-325 MG PO TABS
1.0000 | ORAL_TABLET | Freq: Once | ORAL | Status: AC
Start: 2014-10-05 — End: 2014-10-05
  Administered 2014-10-05: 1 via ORAL
  Filled 2014-10-05: qty 1

## 2014-10-05 MED ORDER — LISINOPRIL 10 MG PO TABS
10.0000 mg | ORAL_TABLET | Freq: Every day | ORAL | Status: DC
  Administered 2014-10-06: 10 mg via ORAL
  Filled 2014-10-05: qty 1

## 2014-10-05 NOTE — Progress Notes (Signed)
Rounding Note:  Spoke with patient about diabetes and home regimen for diabetes control due to elevated A1c level. Inquired about knowledge about A1C and patient reports that he does know what an A1C is. Discussed A1C results (12.1% on 10/04/14) and explained what an A1C is, basic pathophysiology of DM Type 2, basic home care, and maintaining good CBG control to prevent long-term and short-term complications. Discussed impact of nutrition on diabetes control.  Patient reported knowing his A1c was not good. Patient reports that while he weighed 235 his sugars ranged from 150-200. He said now that he has gained weight and now weighs 285 he has seen his glucose levels up into the 500's. He said he loves to cook and eat. Patient reports his knowledge of healthy carb modified diet and said he knew what to do. He said there was nothing that I could do to help him. He reports he does take his medication and has alarms on his phone to help remind himself of this. Patient verbalized understanding of information discussed and has no further questions at this time related to diabetes.   Thanks,  Tama Headings RN, MSN, [facility] Inpatient Diabetes Coordinator Team Pager [phone]

## 2014-10-05 NOTE — Evaluation (Signed)
Occupational Therapy Evaluation Patient Details Name: Thomas Sosa MRN: [id number] DOB: 02-Nov-1955 Today's Date: 10/05/2014    History of Present Illness 59 y.o. male with history of HTN DM II Hyperlipidemia presents with c/o tingling over the left side of his Face and body. Imaging revealed Cerebral Infarct Involving the Right Cerebral Cortex Peri-Rolandic Area. Patient also with small infarcts in right occipital pole.   Clinical Impression   Pt able to complete ADLs with supervision. 1 LOB standing at sink, Pt able to self correct and reports using cane at home for balance. Pt denies vision or sensation deficits, other than baseline bil LE neuropathy and L thumb numbness. Pt able to step over object in room and pick items up from floor with no LOB. Education provided on safety and fall prevention at home. Pt able to complete simple math, and recites recipes from memory. Pt reports family (nephew) available to assist as needed upon d/c; nephew also provides transportation for appointments and groceries.    Follow Up Recommendations  No OT follow up;Supervision - Intermittent    Equipment Recommendations  None recommended by OT    Recommendations for Other Services       Precautions / Restrictions Precautions Precautions: Fall Restrictions Weight Bearing Restrictions: No      Mobility Bed Mobility               General bed mobility comments: Pt in chair upon arrival  Transfers Overall transfer level: Needs assistance Equipment used: None Transfers: Sit to/from Stand Sit to Stand: Supervision         General transfer comment: no reported dizziness upon standing    Balance Overall balance assessment: Needs assistance (Pt reports use of cane at home)                                          ADL Overall ADL's : At baseline                                       General ADL Comments: Pt able to complete ADLs with supervision      Vision Vision Assessment?: Yes Eye Alignment: Within Functional Limits Ocular Range of Motion: Within Functional Limits Alignment/Gaze Preference: Within Defined Limits Tracking/Visual Pursuits: Able to track stimulus in all quads without difficulty Saccades: Within functional limits Convergence: Within functional limits Visual Fields: No apparent deficits   Perception     Praxis      Pertinent Vitals/Pain Pain Assessment: No/denies pain     Hand Dominance Right   Extremity/Trunk Assessment Upper Extremity Assessment Upper Extremity Assessment: Overall WFL for tasks assessed (L thumb numb at baseline)   Lower Extremity Assessment Lower Extremity Assessment: Overall WFL for tasks assessed   Cervical / Trunk Assessment Cervical / Trunk Assessment: Normal   Communication Communication Communication: No difficulties   Cognition Arousal/Alertness: Awake/alert Behavior During Therapy: WFL for tasks assessed/performed Overall Cognitive Status: Within Functional Limits for tasks assessed (mildly )                     General Comments       Exercises       Shoulder Instructions      Home Living Family/patient expects to be discharged to:: Private residence Living Arrangements: Alone Available Help at Discharge:  Family;Friend(s);Neighbor;Available 24 hours/day Type of Home: Apartment Home Access: Stairs to enter Entrance Stairs-Number of Steps: 12 Entrance Stairs-Rails: Can reach both Home Layout: One level     Bathroom Shower/Tub: Tub/shower unit;Curtain Shower/tub characteristics: Architectural technologist: Standard     Home Equipment: Radio producer - single point      Lives With: Alone    Prior Functioning/Environment Level of Independence: Independent             OT Diagnosis: Generalized weakness   OT Problem List: Decreased strength   OT Treatment/Interventions:      OT Goals(Current goals can be found in the care plan section) Acute  Rehab OT Goals Patient Stated Goal: to go home OT Goal Formulation: With patient Time For Goal Achievement: 10/19/14 Potential to Achieve Goals: Good  OT Frequency:     Barriers to D/C:            Co-evaluation              End of Session Equipment Utilized During Treatment: Gait belt Nurse Communication: Mobility status  Activity Tolerance: Patient tolerated treatment well Patient left: in bed;with call bell/phone within reach;with nursing/sitter in room   Time: 1025-1052 OT Time Calculation (min): 27 min Charges:  OT General Charges $OT Visit: 1 Procedure OT Evaluation $Initial OT Evaluation Tier I: 1 Procedure OT Treatments $Self Care/Home Management : 8-22 mins G-Codes:    Forest Gleason 10/05/2014, 12:01 PM

## 2014-10-05 NOTE — Progress Notes (Signed)
Bilateral carotid artery duplex completed:  1-39% ICA stenosis.  Vertebral artery flow is antegrade.     

## 2014-10-05 NOTE — Progress Notes (Signed)
[facility] PROGRESS NOTE  Thomas Sosa HER:[id number] DOB: October 27, 1955 DOA: 10/04/2014 PCP: Thomas Bosch, NP  Assessment/Plan: Principal Problem:   Cerebral infarction due to embolism of right middle cerebral artery Active Problems:   HTN (hypertension)   Hyperlipidemia   Type 2 diabetes mellitus with peripheral neuropathy   Stroke   Acute embolic stroke     Stroke: R peri-rolandic subcortical and R occipital infarcts secondary to small vessel disease source in setting of multiple uncontrolled risk factors Resultant R face and hand tingling  MRI R peri-rolandic cortical and subcortical white matter infarcts, R occipital infarcts TCD to look at intracerebral vascular imagine Carotid Doppler No significant stenosis  2D Echo pending   LDL 100  HgbA1c 12.1  Heparin 5000 units sq tid for VTE prophylaxis  aspirin 81 mg orally every day prior to admission, neurology Recommend change aspirin to Plavix for stroke prevention.  According to neurology the numbness in his hand and face will likely wax and wane over the next few days before stabilizing. Pt's RN also educated.  Ongoing aggressive stroke risk factor management  Therapy recommendations: No PT follow-up recommended  Disposition: Discharge home tomorrow after results of echo are available  Hypertension BP as low as 92/54 past 24h, reduce dose of lisinopril, discontinue meloxicam in the setting of CVA  Hyperlipidemia Home meds: lipitor 40, resumed in hospital, LDL 100, goal < 70,  Continue statin at discharge  Diabetes type 2, HgbA1c 12.1, Uncontrolled, continue with Levemir and sliding scale insulin, continue glipizide  Cigarette smoker, quit smoking 6 mos ago   Obesity, Body mass index is 37.6 kg/(m^2).   Coronary artery disease, PAD-switch from aspirin to Plavix, continue metoprolol   Code Status:      Code Status Orders        Start     Ordered   10/04/14 2018  Full code   Continuous      10/04/14 2017     Family Communication: family updated about patient's clinical progress Disposition Plan:  Anticipate discharge tomorrow    Brief narrative: Thomas Sosa is an 59 y.o. male who reports that he was watching television on Saturday (10/02/14) and had the acute onset of elft sided numbness. He noted the numbness initially on his face. It moved downward to include his upper extremity, front of his trunk, back and then leg. He at that time noted some weakness as well. The patient had a similar episode on 7/5. This episode resolved spontaneously after a few hours. With no resolution of his symptoms on this occasion he presented for evaluation.   Consultants:  Neurology  Procedures:  None  Antibiotics: Anti-infectives    None         HPI/Subjective: He continues to have waxing and waning of numbness in his right face and right hand.  Objective: Filed Vitals:   10/05/14 0600 10/05/14 0758 10/05/14 1154 10/05/14 1323  BP: 100/68 105/68 104/55 106/61  Pulse: 74 87 69 71  Temp: 97.9 F (36.6 C)  98.1 F (36.7 C) 98.2 F (36.8 C)  TempSrc: Oral   Oral  Resp: 18  19 18   Height:      Weight:      SpO2: 99% 99% 99%     Intake/Output Summary (Last 24 hours) at 10/05/14 1452 Last data filed at 10/04/14 1456  Gross per 24 hour  Intake      0 ml  Output    500 ml  Net   -  500 ml    Exam:  General: No acute respiratory distress Lungs: Clear to auscultation bilaterally without wheezes or crackles Cardiovascular: Regular rate and rhythm without murmur gallop or rub normal S1 and S2 Abdomen: Nontender, nondistended, soft, bowel sounds positive, no rebound, no ascites, no appreciable mass Extremities: No significant cyanosis, clubbing, or edema bilateral lower extremities Neurological Exam :  Awake alert oriented x 3 normal speech and language. Mild left lower face asymmetry. Tongue midline. No drift. Mild diminished fine finger movements on left. Orbits  right over left upper extremity. Mild left grip weak..    Data Review   Micro Results No results found for this or any previous visit (from the past 240 hour(s)).  Radiology Reports Dg Chest 2 View  10/04/2014   CLINICAL DATA:  Shortness of breath and cough  EXAM: CHEST - 2 VIEW  COMPARISON:  None.  FINDINGS: Postsurgical changes are noted. The cardiac shadow is within normal limits. The lungs are clear bilaterally. No acute bony abnormality is noted.  IMPRESSION: No active disease.   Electronically Signed   By: Thomas Sosa M.D.   On: 10/04/2014 13:03   Mr Brain Wo Contrast  10/04/2014   CLINICAL DATA:  59 year old male with left side numbness and weakness for 2 days. Unresolved symptoms. Initial encounter.  EXAM: MRI HEAD WITHOUT CONTRAST  TECHNIQUE: Multiplanar, multiecho pulse sequences of the brain and surrounding structures were obtained without intravenous contrast.  COMPARISON:  None.  FINDINGS: Clustered small foci of abnormal trace diffusion in the right peri-Rolandic cortex and subcortical white matter near the right motor strip (series 9, image 13). The most confluent foci do appear mildly restricted on ADC (series 300, image 38). Mild T2 and FLAIR hyperintensity associated. There is evidence of associated petechial hemorrhage (series 7, image 86), but no mass effect.  There are also several small foci of similar abnormal trace diffusion in the right occipital pole involving both cortex and white matter (series 3 images 19-22).  No posterior fossa or contralateral left hemisphere diffusion abnormality.  Major intracranial vascular flow voids are preserved, there is a mild degree of intracranial artery dolichoectasia.  Patchy bilateral cerebral white matter T2 and FLAIR hyper intensity, with areas which most resemble small chronic white matter lacunar infarcts (series 6, image 17). No definite cortical encephalomalacia. Occasional chronic micro hemorrhages in the brain suggested on  susceptibility weighted imaging. Deep gray matter nuclei are within normal limits. Mild nonspecific patchy T2 hyperintensity in the pons. Small chronic right cerebellar lacunar infarct on series 5, image 6.  No midline shift, mass effect, evidence of mass lesion, ventriculomegaly, extra-axial collection or acute intracranial hemorrhage. Cervicomedullary junction and pituitary are within normal limits. Visible internal auditory structures appear normal. Mastoids are clear. Moderate paranasal sinus mucosal thickening. Visualized orbit soft tissues are within normal limits. Scalp soft tissues are within normal limits. Visualized bone marrow signal is within normal limits. Negative for age visualized cervical spine.  IMPRESSION: 1. Small subacute appearing infarcts in the right peri-Rolandic cortex and subcortical white matter. Similar small infarcts in the right occipital pole. Minimal petechial hemorrhage with no mass effect. 2. Evidence of underlying chronic small vessel ischemia in both cerebral hemispheres, and the right cerebellum.   Electronically Signed   By: Genevie Ann M.D.   On: 10/04/2014 18:18   Ct Angio Chest Aorta W/cm &/or Wo/cm  10/04/2014   CLINICAL DATA:  59 year old male with left-sided chest pain and tingling  EXAM: CT ANGIOGRAPHY CHEST WITH CONTRAST  TECHNIQUE: Multidetector CT imaging of the chest was performed using the standard protocol during bolus administration of intravenous contrast. Multiplanar CT image reconstructions and MIPs were obtained to evaluate the vascular anatomy.  CONTRAST:  143mL OMNIPAQUE IOHEXOL 350 MG/ML SOLN  COMPARISON:  Chest x-ray obtained earlier today  FINDINGS: Mediastinum: Unremarkable CT appearance of the thyroid gland. No suspicious mediastinal or hilar adenopathy. No soft tissue mediastinal mass. The thoracic esophagus is unremarkable.  Heart/Vascular: On the initial unenhanced images, there is no evidence of crescentic high attenuation in the vascular media to  suggest acute intramural hematoma. Following a opacification with intravenous contrast, there is no evidence of dissection or aneurysm. Conventional 3 vessel arch anatomy. Scant atherosclerotic vascular calcifications without evidence of stenosis. Normal caliber main and central pulmonary arteries without evidence of central PE. There is cardiomegaly with left ventricular dilatation. Extensive calcifications are present throughout the coronary arteries. Post surgical changes suggest prior 2 vessel CABG. Aortic to distal RCA graft opacifies with contrast material as does the aorta to mid to distal LAD.  Lungs/Pleura: Trace dependent atelectasis. Otherwise, the lungs are clear.  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 No acute fracture or aggressive appearing lytic or blastic osseous lesion. Multilevel degenerative disc disease.  Upper Abdomen: Visualized upper abdominal organs are unremarkable.  Review of the MIP images confirms the above findings.  IMPRESSION: 1. No evidence of acute aortic dissection, aneurysm or other acute vascular abnormality. 2. Cardiomegaly with left ventricular dilatation. 3. Advanced calcification of the coronary arteries. Post surgical changes of prior multivessel CABG. Although evaluation is limited by non cardiac gated technique, the proximal aspect of the bypass grafts opacify with contrast material suggesting patency.   Electronically Signed   By: Jacqulynn Cadet M.D.   On: 10/04/2014 15:04     CBC  Recent Labs Lab 10/04/14 1100 10/04/14 1204  WBC 6.9  --   HGB 15.2 16.3  HCT 43.5 48.0  PLT 282  --   MCV 88.4  --   MCH 30.9  --   MCHC 34.9  --   RDW 12.8  --   LYMPHSABS 2.0  --   MONOABS 0.5  --   EOSABS 0.4  --   BASOSABS 0.0  --     Chemistries   Recent Labs Lab 10/04/14 1100 10/04/14 1204 10/05/14 0540  NA 132* 137  --   K 5.1 4.6  --   CL 101 102  --    CO2 23  --   --   GLUCOSE 393* 403*  --   BUN 29* 37*  --   CREATININE 0.92 0.90  --   CALCIUM 8.5*  --   --   MG  --   --  1.9  AST 34  --   --   ALT 18  --   --   ALKPHOS 80  --   --   BILITOT 1.2  --   --    ------------------------------------------------------------------------------------------------------------------ estimated creatinine clearance is 117.6 mL/min (by C-G formula based on Cr of 0.9). ------------------------------------------------------------------------------------------------------------------  Recent Labs  10/04/14 2057  HGBA1C 12.1*   ------------------------------------------------------------------------------------------------------------------  Recent Labs  10/04/14 2057 10/05/14 0540  CHOL 194 179  HDL 34* 28*  LDLCALC 124* 100*  TRIG 182* 253*  CHOLHDL 5.7 6.4   ------------------------------------------------------------------------------------------------------------------ No results for input(s): TSH, T4TOTAL, T3FREE, THYROIDAB in the last 72 hours.  Invalid input(s): FREET3 ------------------------------------------------------------------------------------------------------------------ No  results for input(s): VITAMINB12, FOLATE, FERRITIN, TIBC, IRON, RETICCTPCT in the last 72 hours.  Coagulation profile  Recent Labs Lab 10/04/14 1100  INR 1.15    No results for input(s): DDIMER in the last 72 hours.  Cardiac Enzymes  Recent Labs Lab 10/04/14 2057  TROPONINI 0.03   ------------------------------------------------------------------------------------------------------------------ Invalid input(s): POCBNP   CBG:  Recent Labs Lab 10/04/14 2021 10/05/14 0003 10/05/14 0401 10/05/14 0758 10/05/14 1200  GLUCAP 318* 381* 194* 157* 234*       Studies: Dg Chest 2 View  10/04/2014   CLINICAL DATA:  Shortness of breath and cough  EXAM: CHEST - 2 VIEW  COMPARISON:  None.  FINDINGS: Postsurgical changes are noted.  The cardiac shadow is within normal limits. The lungs are clear bilaterally. No acute bony abnormality is noted.  IMPRESSION: No active disease.   Electronically Signed   By: Thomas Sosa M.D.   On: 10/04/2014 13:03   Mr Brain Wo Contrast  10/04/2014   CLINICAL DATA:  59 year old male with left side numbness and weakness for 2 days. Unresolved symptoms. Initial encounter.  EXAM: MRI HEAD WITHOUT CONTRAST  TECHNIQUE: Multiplanar, multiecho pulse sequences of the brain and surrounding structures were obtained without intravenous contrast.  COMPARISON:  None.  FINDINGS: Clustered small foci of abnormal trace diffusion in the right peri-Rolandic cortex and subcortical white matter near the right motor strip (series 9, image 13). The most confluent foci do appear mildly restricted on ADC (series 300, image 38). Mild T2 and FLAIR hyperintensity associated. There is evidence of associated petechial hemorrhage (series 7, image 86), but no mass effect.  There are also several small foci of similar abnormal trace diffusion in the right occipital pole involving both cortex and white matter (series 3 images 19-22).  No posterior fossa or contralateral left hemisphere diffusion abnormality.  Major intracranial vascular flow voids are preserved, there is a mild degree of intracranial artery dolichoectasia.  Patchy bilateral cerebral white matter T2 and FLAIR hyper intensity, with areas which most resemble small chronic white matter lacunar infarcts (series 6, image 17). No definite cortical encephalomalacia. Occasional chronic micro hemorrhages in the brain suggested on susceptibility weighted imaging. Deep gray matter nuclei are within normal limits. Mild nonspecific patchy T2 hyperintensity in the pons. Small chronic right cerebellar lacunar infarct on series 5, image 6.  No midline shift, mass effect, evidence of mass lesion, ventriculomegaly, extra-axial collection or acute intracranial hemorrhage. Cervicomedullary  junction and pituitary are within normal limits. Visible internal auditory structures appear normal. Mastoids are clear. Moderate paranasal sinus mucosal thickening. Visualized orbit soft tissues are within normal limits. Scalp soft tissues are within normal limits. Visualized bone marrow signal is within normal limits. Negative for age visualized cervical spine.  IMPRESSION: 1. Small subacute appearing infarcts in the right peri-Rolandic cortex and subcortical white matter. Similar small infarcts in the right occipital pole. Minimal petechial hemorrhage with no mass effect. 2. Evidence of underlying chronic small vessel ischemia in both cerebral hemispheres, and the right cerebellum.   Electronically Signed   By: Genevie Ann M.D.   On: 10/04/2014 18:18   Ct Angio Chest Aorta W/cm &/or Wo/cm  10/04/2014   CLINICAL DATA:  59 year old male with left-sided chest pain and tingling  EXAM: CT ANGIOGRAPHY CHEST WITH CONTRAST  TECHNIQUE: Multidetector CT imaging of the chest was performed using the standard protocol during bolus administration of intravenous contrast. Multiplanar CT image reconstructions and MIPs were obtained to evaluate the vascular anatomy.  CONTRAST:  161mL  OMNIPAQUE IOHEXOL 350 MG/ML SOLN  COMPARISON:  Chest x-ray obtained earlier today  FINDINGS: Mediastinum: Unremarkable CT appearance of the thyroid gland. No suspicious mediastinal or hilar adenopathy. No soft tissue mediastinal mass. The thoracic esophagus is unremarkable.  Heart/Vascular: On the initial unenhanced images, there is no evidence of crescentic high attenuation in the vascular media to suggest acute intramural hematoma. Following a opacification with intravenous contrast, there is no evidence of dissection or aneurysm. Conventional 3 vessel arch anatomy. Scant atherosclerotic vascular calcifications without evidence of stenosis. Normal caliber main and central pulmonary arteries without evidence of central PE. There is cardiomegaly with  left ventricular dilatation. Extensive calcifications are present throughout the coronary arteries. Post surgical changes suggest prior 2 vessel CABG. Aortic to distal RCA graft opacifies with contrast material as does the aorta to mid to distal LAD.  Lungs/Pleura: Trace dependent atelectasis. Otherwise, the lungs are clear.  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 No acute fracture or aggressive appearing lytic or blastic osseous lesion. Multilevel degenerative disc disease.  Upper Abdomen: Visualized upper abdominal organs are unremarkable.  Review of the MIP images confirms the above findings.  IMPRESSION: 1. No evidence of acute aortic dissection, aneurysm or other acute vascular abnormality. 2. Cardiomegaly with left ventricular dilatation. 3. Advanced calcification of the coronary arteries. Post surgical changes of prior multivessel CABG. Although evaluation is limited by non cardiac gated technique, the proximal aspect of the bypass grafts opacify with contrast material suggesting patency.   Electronically Signed   By: Jacqulynn Cadet M.D.   On: 10/04/2014 15:04      Lab Results  Component Value Date   HGBA1C 12.1* 10/04/2014   HGBA1C 12.40 05/12/2014   HGBA1C 9.5 02/22/2014   Lab Results  Component Value Date   MICROALBUR 56.9* 02/22/2014   LDLCALC 100* 10/05/2014   CREATININE 0.90 10/04/2014       Scheduled Meds: . atorvastatin  40 mg Oral Daily  . clopidogrel  75 mg Oral Daily  . gabapentin  900 mg Oral TID  . glipiZIDE  5 mg Oral Q breakfast  . heparin  5,000 Units Subcutaneous 3 times per day  . insulin aspart  0-15 Units Subcutaneous 6 times per day  . insulin detemir  25 Units Subcutaneous BID  . lisinopril  20 mg Oral Daily  . meloxicam  7.5 mg Oral Daily  . metoprolol tartrate  12.5 mg Oral Daily  . pantoprazole  40 mg Oral Daily  . sertraline  50 mg Oral Daily    Continuous Infusions:   Principal Problem:   Cerebral infarction due to embolism of right middle cerebral artery Active Problems:   HTN (hypertension)   Hyperlipidemia   Type 2 diabetes mellitus with peripheral neuropathy   Stroke   Acute embolic stroke    Time spent: 45 minutes   [facility] Pager [phone]. If 7PM-7AM, please contact night-coverage at [email or web address], password [facility] 10/05/2014, 2:52 PM  LOS: 1 day

## 2014-10-05 NOTE — Progress Notes (Signed)
STROKE TEAM PROGRESS NOTE   HISTORY Thomas Sosa is an 59 y.o. male who reports that he was watching television on Saturday (10/02/14) and had the acute onset of elft sided numbness. He noted the numbness initially on his face. It moved downward to include his upper extremity, front of his trunk, back and then leg. He at that time noted some weakness as well. The patient had a similar episode on 7/5. This episode resolved spontaneously after a few hours. With no resolution of his symptoms on this occasion he presented for evaluation.   Date last known well: Date: 10/02/2014 Time last known well: Time: 12:00 tPA Given: No: Outside time window   SUBJECTIVE (INTERVAL HISTORY) No family is at the bedside.  Overall he feels his condition is okay. He continues to have waxing and waning of numbness in his right face and right hand.   OBJECTIVE Temp:  [97.9 F (36.6 C)-98.8 F (37.1 C)] 97.9 F (36.6 C) (07/12 0600) Pulse Rate:  [73-89] 87 (07/12 0758) Cardiac Rhythm:  [-] Normal sinus rhythm (07/12 0835) Resp:  [10-24] 18 (07/12 0600) BP: (92-169)/(54-99) 105/68 mmHg (07/12 0758) SpO2:  [95 %-100 %] 99 % (07/12 0758)   Recent Labs Lab 10/04/14 1118 10/04/14 2021 10/05/14 0003 10/05/14 0401 10/05/14 0758  GLUCAP 379* 318* 381* 194* 157*    Recent Labs Lab 10/04/14 1100 10/04/14 1204 10/05/14 0540  NA 132* 137  --   K 5.1 4.6  --   CL 101 102  --   CO2 23  --   --   GLUCOSE 393* 403*  --   BUN 29* 37*  --   CREATININE 0.92 0.90  --   CALCIUM 8.5*  --   --   MG  --   --  1.9    Recent Labs Lab 10/04/14 1100  AST 34  ALT 18  ALKPHOS 80  BILITOT 1.2  PROT 7.8  ALBUMIN 3.3*    Recent Labs Lab 10/04/14 1100 10/04/14 1204  WBC 6.9  --   NEUTROABS 4.0  --   HGB 15.2 16.3  HCT 43.5 48.0  MCV 88.4  --   PLT 282  --     Recent Labs Lab 10/04/14 2057  TROPONINI 0.03    Recent Labs  10/04/14 1100  LABPROT 14.9  INR 1.15   No results for input(s):  COLORURINE, LABSPEC, PHURINE, GLUCOSEU, HGBUR, BILIRUBINUR, KETONESUR, PROTEINUR, UROBILINOGEN, NITRITE, LEUKOCYTESUR in the last 72 hours.  Invalid input(s): APPERANCEUR     Component Value Date/Time   CHOL 179 10/05/2014 0540   TRIG 253* 10/05/2014 0540   HDL 28* 10/05/2014 0540   CHOLHDL 6.4 10/05/2014 0540   VLDL 51* 10/05/2014 0540   LDLCALC 100* 10/05/2014 0540   Lab Results  Component Value Date   HGBA1C 12.1* 10/04/2014   No results found for: LABOPIA, COCAINSCRNUR, LABBENZ, AMPHETMU, THCU, LABBARB  No results for input(s): ETH in the last 168 hours.   Dg Chest 2 View 10/04/2014    No active disease.    Mr Brain Wo Contrast 10/04/2014   1. Small subacute appearing infarcts in the right peri-Rolandic cortex and subcortical white matter. Similar small infarcts in the right occipital pole. Minimal petechial hemorrhage with no mass effect. 2. Evidence of underlying chronic small vessel ischemia in both cerebral hemispheres, and the right cerebellum.    Ct Angio Chest Aorta W/cm &/or Wo/cm 10/04/2014    1. No evidence of acute aortic dissection, aneurysm or other acute  vascular abnormality. 2. Cardiomegaly with left ventricular dilatation. 3. Advanced calcification of the coronary arteries. Post surgical changes of prior multivessel CABG. Although evaluation is limited by non cardiac gated technique, the proximal aspect of the bypass grafts opacify with contrast material suggesting patency.     Carotid Doppler  There is 1-39% bilateral ICA stenosis. Vertebral artery flow is antegrade.     PHYSICAL EXAM Well built and slightly obese middle-aged African-American male. . Afebrile. Head is nontraumatic. Neck is supple without bruit.    Cardiac exam no murmur or gallop. Lungs are clear to auscultation. Distal pulses are well felt. Neurological Exam :  Awake alert oriented x 3 normal speech and language. Mild left lower face asymmetry. Tongue midline. No drift. Mild diminished fine  finger movements on left. Orbits right over left upper extremity. Mild left grip weak..Decrease left hemibody sensation . Normal coordination. ASSESSMENT/PLAN [name] is a 59 y.o. male with history of HTN, DM II and Hyperlipidemia  presenting with Left sided numbness and weakness. He did not receive IV t-PA due to being outside the window.   Stroke:  R peri-rolandic subcortical and R occipital infarcts secondary to small vessel disease source in setting of multiple uncontrolled risk factors  Resultant  R face and hand tingling  MRI  R peri-rolandic cortical and subcortical white matter infarcts, R occipital infarcts  TCD to look at intracerebral vascular imagin  Carotid Doppler  No significant stenosis   2D Echo  pending   LDL 100  HgbA1c 12.1  Heparin 5000 units sq tid for VTE prophylaxis  Diet heart healthy/carb modified Room service appropriate?: Yes; Fluid consistency:: Thin  aspirin 81 mg orally every day prior to admission, now on aspirin 325 mg orally every day. As he was on aspirin prior to admission, will change to plavix 75 mg daily.  Patient counseled to be compliant with his antithrombotic medications  Patient educated that the numbness in his hand and face will likely wax and wane over the next few days before stabilizing. Pt's RN also educated.  Ongoing aggressive stroke risk factor management  Therapy recommendations:  No PT or OP  Disposition:  D/c home  Hypertension  Hx of hypertension. Episodes of hypotension in the hospital with BP as low as 92/54 past 24h  Hyperlipidemia  Home meds:  lipitor 40, resumed in hospital  LDL 100, goal < 70  Continue statin at discharge  Diabetes type 2  HgbA1c 12.1, goal < 7.0  Uncontrolled  Other Stroke Risk Factors  Cigarette smoker, quit smoking 6 mos ago   Obesity, Body mass index is 37.6 kg/(m^2).   Coronary artery disease  PAD  Other Active Problems  GERD  Chronic back pain  Chest  pain, atypical  Hospital day # [place] for Pager information 10/05/2014 11:20 AM  I have personally examined this patient, reviewed notes, independently viewed imaging studies, participated in medical decision making and plan of care. I have made any additions or clarifications directly to the above note. Agree with note above. He has presented with recurrent left hemibody paresthesias secondary to tiny right parietal and frontal white matter infarcts in etiology likely small vessel disease. He remains at risk for neurological worsening, recurrent stroke, TIAs and needs ongoing stroke evaluation and aggressive risk factor modification. Recommend change aspirin to Plavix for stroke prevention. Antony Contras, MD Medical Director [facility] Pager: [phone] 10/05/2014 1:56 PM    To contact  Stroke Continuity provider, please refer to [email or web address] After hours, contact General Neurology

## 2014-10-05 NOTE — Progress Notes (Signed)
Pt had 28 beat run of vtach, pt is asymptomatic.  MD notified, new orders given.  Will continue to closely monitor.   Fredrich Romans, RN

## 2014-10-05 NOTE — Evaluation (Signed)
Physical Therapy Evaluation Patient Details Name: Thomas Sosa MRN: [id number] DOB: Dec 29, 1955 Today's Date: 10/05/2014   History of Present Illness  59 y.o. male with history of HTN DM II Hyperlipidemia presents with c/o tingling over the left side of his Face and body. Imaging revealed Cerebral Infarct Involving the Right Cerebral Cortex Peri-Rolandic Area. Patient also with small infarcts in right occipital pole.  Clinical Impression  Patient demonstrates modest deficits as indicated below. Will follow to ensure safety with mobility. Will see as indicated and progress as tolerated. OF NOTE: patient with some dizziness during mobility.     Follow Up Recommendations No PT follow up    Equipment Recommendations  None recommended by PT    Recommendations for Other Services       Precautions / Restrictions Precautions Precautions: Fall      Mobility  Bed Mobility Overal bed mobility: Modified Independent             General bed mobility comments: increased time to perform  Transfers Overall transfer level: Needs assistance Equipment used: None Transfers: Sit to/from Stand Sit to Stand: Supervision         General transfer comment: No physical assist required, Supervision secondary to mild dizziness upon coming to standing  Ambulation/Gait Ambulation/Gait assistance: Supervision Ambulation Distance (Feet): 160 Feet Assistive device: None Gait Pattern/deviations: Step-through pattern;Decreased stride length;Drifts right/left Gait velocity: decreased Gait velocity interpretation: Below normal speed for age/gender General Gait Details: decreased, patient with incidence of bumping into wall on the left and door frame on the left. (question visual deficits)  Stairs Stairs: Yes Stairs assistance: Min guard Stair Management: One rail Left;Step to pattern Number of Stairs: 12 General stair comments: min guard for stability  Wheelchair Mobility    Modified Rankin  (Stroke Patients Only) Modified Rankin (Stroke Patients Only) Pre-Morbid Rankin Score: No symptoms Modified Rankin: Moderately severe disability     Balance                                             Pertinent Vitals/Pain Pain Assessment: No/denies pain    Home Living Family/patient expects to be discharged to:: Private residence Living Arrangements: Alone Available Help at Discharge: Family Type of Home: Apartment Home Access: Stairs to enter Entrance Stairs-Rails: Can reach both Entrance Stairs-Number of Steps: 12 Home Layout: One level Home Equipment: None      Prior Function Level of Independence: Independent               Hand Dominance   Dominant Hand: Right    Extremity/Trunk Assessment   Upper Extremity Assessment: Defer to OT evaluation (left thumb numb)           Lower Extremity Assessment:  (history of neuropathy)         Communication   Communication:  (wears reading glasses)  Cognition Arousal/Alertness: Awake/alert                          General Comments      Exercises        Assessment/Plan    PT Assessment Patient needs continued PT services  PT Diagnosis Abnormality of gait   PT Problem List Decreased activity tolerance;Decreased balance;Decreased mobility  PT Treatment Interventions DME instruction;Gait training;Stair training;Functional mobility training;Therapeutic activities;Therapeutic exercise;Balance training;Patient/family education   PT Goals (Current goals can be found  in the Care Plan section) Acute Rehab PT Goals Patient Stated Goal: to go home PT Goal Formulation: With patient Time For Goal Achievement: 10/19/14 Potential to Achieve Goals: Good    Frequency Min 3X/week   Barriers to discharge Decreased caregiver support      Co-evaluation               End of Session Equipment Utilized During Treatment: Gait belt Activity Tolerance: Patient tolerated treatment  well Patient left: in chair;with call bell/phone within reach;with chair alarm set Nurse Communication: Mobility status         Time: 9842-1031 PT Time Calculation (min) (ACUTE ONLY): 24 min   Charges:   PT Evaluation $Initial PT Evaluation Tier I: 1 Procedure PT Treatments $Gait Training: 8-22 mins   PT G CodesDuncan Dull October 26, 2014, 8:49 AM Alben Deeds, PT DPT  [phone]

## 2014-10-05 NOTE — Clinical Social Work Note (Signed)
Clinical Social Worker consult acknowledged:  CSW received consult for "Patient on disability and having problems paying for medications". CSW to notify RNCM.  Clinical Social Worker will sign off for now as social work intervention is no longer needed. Please consult [place] again if new need arises.  Glendon Axe, MSW, Portal [phone] 10/05/2014 10:22 AM

## 2014-10-05 NOTE — Evaluation (Addendum)
Speech Language Pathology Evaluation Patient Details Name: Thomas Sosa MRN: [id number] DOB: [birth year] Today's Date: 10/05/2014 Time: 4098-1191 SLP Time Calculation (min) (ACUTE ONLY): 22 min  Problem List:  Patient Active Problem List   Diagnosis Date Noted  . Type 2 diabetes mellitus with peripheral neuropathy 10/04/2014  . Cerebral infarction due to embolism of right middle cerebral artery 10/04/2014  . Stroke 10/04/2014  . Acute embolic stroke 47/82/9562  . Peripheral arterial disease 04/09/2014  . Hyperlipidemia 03/24/2014  . Diabetic peripheral neuropathy 03/24/2014  . Coronary artery disease 03/24/2014  . DM type 2 (diabetes mellitus, type 2) 02/22/2014  . HTN (hypertension) 02/22/2014   Past Medical History:  Past Medical History  Diagnosis Date  . Hypertension   . Diabetes mellitus without complication   . Diabetic neuropathy Dx 2011  . Cancer of prostate Dx 2005  . Hyperlipidemia   . Coronary artery disease   . Tobacco abuse   . Peripheral arterial disease    Past Surgical History:  Past Surgical History  Procedure Laterality Date  . Replacement total knee  Lt 2013/ Rt 2005   HPI:  Abhinav Mayorquin is a 59 y.o. male with history of HTN DM II Hyperlipidemia presents with c/o tingling over the left side of his Face and body. Patient states that the symptoms started no Saturday. Patient initially developed Left sided numbness involving the face but appears did not get medical help. Patient states that symptoms appeared to spread to involve the upper extremity as well as his leg. Associated with this was noted some weakness. This is not the first that he has had this numbness. Patient states that this time however the symptoms have not resolved. He also had some chest pain noted which was associated with radiation to the arm and also some nausea and dizziness. No visual disturbance noted. He had no syncope no vomiting noted.   Assessment / Plan / Recommendation Clinical  Impression   Pt presents with mild cognitive deficits characterized by decreased recall of new information, decreased emergent/anticipatory awareness of deficits, and decreased functional problem solving.  Pt also presents with mild deficits for alternating attention and topic maintenance during functional conversations with SLP and required intermittent cues for redirection to task.  Suspect that pt is near his cognitive baseline given reports of baseline memory deficits; however, he may benefit from skilled ST follow up while inpatient for compensatory strategy instruction and cognitive monitoring.  Do not anticipate that pt will need ST follow up at discharge.      SLP Assessment  Patient needs continued Speech Lanaguage Pathology Services    Follow Up Recommendations  None    Frequency and Duration min 1 x/week  1 week   Pertinent Vitals/Pain Pain Assessment: No/denies pain   SLP Goals  Patient/Family Stated Goal: none stated  Potential to Achieve Goals (ACUTE ONLY): Good  SLP Evaluation Prior Functioning  Cognitive/Linguistic Baseline: Baseline deficits Baseline deficit details: pt reports mild memory deficits at baseline  Type of Home: Apartment  Lives With: Alone Available Help at Discharge: Family Education: GED Vocation: On disability   Cognition  Overall Cognitive Status: Impaired/Different from baseline (mildly ) Arousal/Alertness: Awake/alert Orientation Level: Oriented X4 Attention: Alternating Alternating Attention: Impaired Alternating Attention Impairment: Verbal complex Memory: Impaired Memory Impairment: Decreased recall of new information Awareness: Impaired Awareness Impairment: Anticipatory impairment;Emergent impairment Problem Solving: Impaired Problem Solving Impairment: Functional complex Executive Function: Self Correcting Self Correcting: Impaired Self Correcting Impairment: Functional complex;Verbal complex Safety/Judgment: Appears  intact Comments: pt  with poor topic maintenance during functional conversations with SLP    Comprehension  Auditory Comprehension Overall Auditory Comprehension: Appears within functional limits for tasks assessed    Expression Expression Primary Mode of Expression: Verbal Verbal Expression Overall Verbal Expression: Appears within functional limits for tasks assessed Pragmatics: Impairment Impairments: Topic maintenance Written Expression Dominant Hand: Right   Oral / Motor Oral Motor/Sensory Function Overall Oral Motor/Sensory Function: Appears within functional limits for tasks assessed Motor Speech Overall Motor Speech: Appears within functional limits for tasks assessed   GO     Jazon Jipson, Selinda Orion 10/05/2014, 10:20 AM

## 2014-10-06 ENCOUNTER — Ambulatory Visit (HOSPITAL_COMMUNITY): Payer: Medicare HMO

## 2014-10-06 DIAGNOSIS — I5022 Chronic systolic (congestive) heart failure: Secondary | ICD-10-CM

## 2014-10-06 DIAGNOSIS — I6350 Cerebral infarction due to unspecified occlusion or stenosis of unspecified cerebral artery: Secondary | ICD-10-CM

## 2014-10-06 DIAGNOSIS — I10 Essential (primary) hypertension: Secondary | ICD-10-CM

## 2014-10-06 DIAGNOSIS — I63411 Cerebral infarction due to embolism of right middle cerebral artery: Secondary | ICD-10-CM

## 2014-10-06 LAB — COMPREHENSIVE METABOLIC PANEL
ALK PHOS: 59 U/L (ref 38–126)
ALT: 19 U/L (ref 17–63)
ANION GAP: 8 (ref 5–15)
AST: 15 U/L (ref 15–41)
Albumin: 3.1 g/dL — ABNORMAL LOW (ref 3.5–5.0)
BILIRUBIN TOTAL: 0.6 mg/dL (ref 0.3–1.2)
BUN: 28 mg/dL — ABNORMAL HIGH (ref 6–20)
CALCIUM: 8.6 mg/dL — AB (ref 8.9–10.3)
CO2: 25 mmol/L (ref 22–32)
Chloride: 101 mmol/L (ref 101–111)
Creatinine, Ser: 1.15 mg/dL (ref 0.61–1.24)
GFR calc non Af Amer: >60 mL/min (ref >60)
Glucose, Bld: 163 mg/dL — ABNORMAL HIGH (ref 65–99)
Potassium: 3.6 mmol/L (ref 3.5–5.1)
SODIUM: 134 mmol/L — AB (ref 135–145)
Total Protein: 7.6 g/dL (ref 6.5–8.1)

## 2014-10-06 LAB — GLUCOSE, CAPILLARY
GLUCOSE-CAPILLARY: 144 mg/dL — AB (ref 65–99)
GLUCOSE-CAPILLARY: 213 mg/dL — AB (ref 65–99)
Glucose-Capillary: 165 mg/dL — ABNORMAL HIGH (ref 65–99)
Glucose-Capillary: 171 mg/dL — ABNORMAL HIGH (ref 65–99)
Glucose-Capillary: 220 mg/dL — ABNORMAL HIGH (ref 65–99)
Glucose-Capillary: 238 mg/dL — ABNORMAL HIGH (ref 65–99)

## 2014-10-06 LAB — HEMOGLOBIN A1C
Hgb A1c MFr Bld: 12.1 % — ABNORMAL HIGH (ref 4.8–5.6)
Mean Plasma Glucose: 301 mg/dL

## 2014-10-06 LAB — CBC
HCT: 41.7 % (ref 39.0–52.0)
Hemoglobin: 13.9 g/dL (ref 13.0–17.0)
MCH: 29.7 pg (ref 26.0–34.0)
MCHC: 33.3 g/dL (ref 30.0–36.0)
MCV: 89.1 fL (ref 78.0–100.0)
Platelets: 246 10*3/uL (ref 150–400)
RBC: 4.68 MIL/uL (ref 4.22–5.81)
RDW: 13 % (ref 11.5–15.5)
WBC: 6.4 10*3/uL (ref 4.0–10.5)

## 2014-10-06 MED ORDER — CLOPIDOGREL BISULFATE 75 MG PO TABS: 75.0000 mg | ORAL_TABLET | Freq: Every day | ORAL | Status: DC

## 2014-10-06 NOTE — Progress Notes (Signed)
STROKE TEAM PROGRESS NOTE   HISTORY Thomas Sosa is an 59 y.o. male who reports that he was watching television on Saturday (10/02/14) and had the acute onset of elft sided numbness. He noted the numbness initially on his face. It moved downward to include his upper extremity, front of his trunk, back and then leg. He at that time noted some weakness as well. The patient had a similar episode on 7/5. This episode resolved spontaneously after a few hours. With no resolution of his symptoms on this occasion he presented for evaluation.   Date last known well: Date: 10/02/2014 Time last known well: Time: 12:00 tPA Given: No: Outside time window   SUBJECTIVE (INTERVAL HISTORY) No family is at the bedside.  Overall he feels his condition is okay. He has noticed marked improvement of numbness in his right face and right hand.   OBJECTIVE Temp:  [97.8 F (36.6 C)-98.4 F (36.9 C)] 98.2 F (36.8 C) (07/13 1421) Pulse Rate:  [67-76] 71 (07/13 1421) Cardiac Rhythm:  [-] Normal sinus rhythm (07/13 0800) Resp:  [15-187] 187 (07/13 1421) BP: (104-143)/(66-82) 143/82 mmHg (07/13 1421) SpO2:  [96 %-100 %] 96 % (07/13 1421)   Recent Labs Lab 10/06/14 0011 10/06/14 0401 10/06/14 0419 10/06/14 0728 10/06/14 1146  GLUCAP 238* 171* 165* 144* 220*    Recent Labs Lab 10/04/14 1100 10/04/14 1204 10/05/14 0540 10/06/14 0515  NA 132* 137  --  134*  K 5.1 4.6  --  3.6  CL 101 102  --  101  CO2 23  --   --  25  GLUCOSE 393* 403*  --  163*  BUN 29* 37*  --  28*  CREATININE 0.92 0.90  --  1.15  CALCIUM 8.5*  --   --  8.6*  MG  --   --  1.9  --     Recent Labs Lab 10/04/14 1100 10/06/14 0515  AST 34 15  ALT 18 19  ALKPHOS 80 59  BILITOT 1.2 0.6  PROT 7.8 7.6  ALBUMIN 3.3* 3.1*    Recent Labs Lab 10/04/14 1100 10/04/14 1204 10/06/14 0515  WBC 6.9  --  6.4  NEUTROABS 4.0  --   --   HGB 15.2 16.3 13.9  HCT 43.5 48.0 41.7  MCV 88.4  --  89.1  PLT 282  --  246    Recent  Labs Lab 10/04/14 2057  TROPONINI 0.03    Recent Labs  10/04/14 1100  LABPROT 14.9  INR 1.15   No results for input(s): COLORURINE, LABSPEC, PHURINE, GLUCOSEU, HGBUR, BILIRUBINUR, KETONESUR, PROTEINUR, UROBILINOGEN, NITRITE, LEUKOCYTESUR in the last 72 hours.  Invalid input(s): APPERANCEUR     Component Value Date/Time   CHOL 179 10/05/2014 0540   TRIG 253* 10/05/2014 0540   HDL 28* 10/05/2014 0540   CHOLHDL 6.4 10/05/2014 0540   VLDL 51* 10/05/2014 0540   LDLCALC 100* 10/05/2014 0540   Lab Results  Component Value Date   HGBA1C 12.1* 10/04/2014   No results found for: LABOPIA, COCAINSCRNUR, LABBENZ, AMPHETMU, THCU, LABBARB  No results for input(s): ETH in the last 168 hours.   Dg Chest 2 View 10/04/2014    No active disease.    Mr Brain Wo Contrast 10/04/2014   1. Small subacute appearing infarcts in the right peri-Rolandic cortex and subcortical white matter. Similar small infarcts in the right occipital pole. Minimal petechial hemorrhage with no mass effect. 2. Evidence of underlying chronic small vessel ischemia in both cerebral hemispheres,  and the right cerebellum.    Ct Angio Chest Aorta W/cm &/or Wo/cm 10/04/2014    1. No evidence of acute aortic dissection, aneurysm or other acute vascular abnormality. 2. Cardiomegaly with left ventricular dilatation. 3. Advanced calcification of the coronary arteries. Post surgical changes of prior multivessel CABG. Although evaluation is limited by non cardiac gated technique, the proximal aspect of the bypass grafts opacify with contrast material suggesting patency.     Carotid Doppler  There is 1-39% bilateral ICA stenosis. Vertebral artery flow is antegrade.     PHYSICAL EXAM Well built and slightly obese middle-aged African-American male. . Afebrile. Head is nontraumatic. Neck is supple without bruit.    Cardiac exam no murmur or gallop. Lungs are clear to auscultation. Distal pulses are well felt. Neurological Exam :  Awake  alert oriented x 3 normal speech and language. Mild left lower face asymmetry. Tongue midline. No drift. Mild diminished fine finger movements on left. Orbits right over left upper extremity. Mild left grip weak..Decrease left hemibody sensation . Normal coordination. ASSESSMENT/PLAN [name] is a 59 y.o. male with history of HTN, DM II and Hyperlipidemia  presenting with Left sided numbness and weakness. He did not receive IV t-PA due to being outside the window.   Stroke:  R peri-rolandic subcortical and R occipital infarcts secondary to small vessel disease source in setting of multiple uncontrolled risk factors  Resultant  R face and hand tingling  MRI  R peri-rolandic cortical and subcortical white matter infarcts, R occipital infarcts  TCD to look at intracerebral vascular imagin  Carotid Doppler  No significant stenosis   2D Echo  Left ventricle: The cavity size was moderately dilated. Systolic function was severely reduced. The estimated ejection fraction was in the range of 25% to 30%. Diffuse hypokinesis  LDL 100  HgbA1c 12.1  Heparin 5000 units sq tid for VTE prophylaxis Diet heart healthy/carb modified Room service appropriate?: Yes; Fluid consistency:: Thin Diet - low sodium heart healthy  aspirin 81 mg orally every day prior to admission, now on aspirin 325 mg orally every day. As he was on aspirin prior to admission, will change to plavix 75 mg daily.  Patient counseled to be compliant with his antithrombotic medications  Patient educated that the numbness in his hand and face will likely wax and wane over the next few days before stabilizing. Pt's RN also educated.  Ongoing aggressive stroke risk factor management  Therapy recommendations:  No PT or OP  Disposition:  D/c home  Hypertension  Hx of hypertension. Episodes of hypotension in the hospital with BP as low as 92/54 past 24h  Hyperlipidemia  Home meds:  lipitor 40, resumed in  hospital  LDL 100, goal < 70  Continue statin at discharge  Diabetes type 2  HgbA1c 12.1, goal < 7.0  Uncontrolled  Other Stroke Risk Factors  Cigarette smoker, quit smoking 6 mos ago   Obesity, Body mass index is 37.6 kg/(m^2).   Coronary artery disease  PAD  Other Active Problems  GERD  Chronic back pain  Chest pain, atypical  Hospital day # [place] for Pager information 10/06/2014 3:10 PM  I have personally examined this patient, reviewed notes, independently viewed imaging studies, participated in medical decision making and plan of care. I have made any additions or clarifications directly to the above note. Agree with note above. He has presented with recurrent left hemibody paresthesias secondary to tiny right  parietal and frontal white matter infarcts in etiology likely small vessel disease. Recommend continueo Plavix for stroke prevention. Stroke team will sign off. Currently call for questions Antony Contras, MD Medical Director [facility] Pager: [phone] 10/06/2014 3:10 PM    To contact Stroke Continuity provider, please refer to [email or web address] After hours, contact General Neurology

## 2014-10-06 NOTE — Progress Notes (Signed)
Patient being discharged home vitals are stable pain being controled, IV removed, discharge summary explained and signed patient being sent home with nephew.

## 2014-10-06 NOTE — Progress Notes (Addendum)
Inpatient Diabetes Program Recommendations  AACE/ADA: New Consensus Statement on Inpatient Glycemic Control (2013)  Target Ranges:  Prepandial:   less than 140 mg/dL      Peak postprandial:   less than 180 mg/dL (1-2 hours)      Critically ill patients:  140 - 180 mg/dL   Results for ERDEM, NAAS (MRN [id number]) as of 10/06/2014 11:28  Ref. Range 10/05/2014 07:58 10/05/2014 12:00 10/05/2014 16:14 10/05/2014 20:16 10/06/2014 07:28  Glucose-Capillary Latest Ref Range: 65-99 mg/dL 157 (H) 234 (H) 222 (H) 272 (H) 144 (H)   Reason for Admission: CVA  Diabetes history: DM 2 Outpatient Diabetes medications: Glipizide 5 mg Daily, Novolog 1 units TID meal coverage, Levemir 25 units BID Current orders for Inpatient glycemic control: Levemir 25 units BID, Novolog Moderate Q4hrs.  Inpatient Diabetes Program Recommendations Insulin - Meal Coverage: If patient is not discharged, patient takes 10 units of Meal coverage at home. Please consider ordering Novolog 5 units TID with meals in addition to correction.  Thanks,  Tama Headings RN, MSN, [facility] Inpatient Diabetes Coordinator Team Pager [phone]

## 2014-10-06 NOTE — Care Management Important Message (Signed)
Important Message  Patient Details  Name: Thomas Sosa MRN: [id number] Date of Birth: 09-29-1955   Medicare Important Message Given:  Yes-second notification given    Delorse Lek 10/06/2014, 11:39 AM

## 2014-10-06 NOTE — Progress Notes (Signed)
  Echocardiogram 2D Echocardiogram has been performed.  Thomas Sosa 10/06/2014, 5:03 PM

## 2014-10-06 NOTE — Discharge Summary (Signed)
Physician Discharge Summary  Thomas Sosa CHY:[id number] DOB: [birth year] DOA: 10/04/2014  PCP: [name], NP  Admit date: 10/04/2014 Discharge date: 10/06/2014  Time spent: 45 minutes  Recommendations for Outpatient Follow-up:  -Will be discharged home today. -Advised to follow-up with primary care provider in one week and with Dr. Quay Burow in 2 weeks.   Discharge Diagnoses:  Principal Problem:   Cerebral infarction due to embolism of right middle cerebral artery Active Problems:   HTN (hypertension)   Hyperlipidemia   Type 2 diabetes mellitus with peripheral neuropathy   Stroke   Acute embolic stroke   Systolic CHF, chronic   Discharge Condition: Stable and improved  Filed Weights   10/04/14 1037  Weight: 122.244 kg (269 lb 8 oz)    History of present illness:  Thomas Sosa is a 59 y.o. male with history of HTN DM II Hyperlipidemia presents with c/o tingling over the left side of his Face and body. Patient states that the symptoms started no Saturday. Patient initially developed Left sided numbness involving the face but appears did not get medical help. Patient states that symptoms appeared to spread to involve the upper extremity as well as his leg. Associated with this was noted some weakness. This is not the first that he has had this numbness. Patient states that this time however the symptoms have not resolved.He also had some chest pain noted which was associated with radiation to the arm and also some nausea and dizziness. No visual disturbance noted. He had no syncope no vomiting noted.  Hospital Course:   Acute ischemic CVA -MRI shows right subcortical and right occipital infarcts secondary to presumed small vessel disease in setting of multiple uncontrolled risk factors. -Aspirin has been upgraded to Plavix. -Carotid Dopplers: 1-39% ICA stenosis bilaterally with antegrade vertebral flow. -2-D echo: Ejection fraction of 25-30%, diffuse hypokinesis, no  cardiac source of emboli identified. -No follow-up recommended by physical therapy. -Mainstay of treatment will be aggressive risk factor modification.  Hypertension  -Well-controlled on current management.  Hyperlipidemia -LDL is 100, goal given his recent CVA is less than 70. -Continue Lipitor 40 mg at discharge.  Type 2 diabetes -CBGs uncontrolled with hemoglobin A1c of 12.1. -Will need aggressive diabetic management in the outpatient setting.  Tobacco abuse -States he quit smoking about 6 months ago. -Encouraged on his efforts.  Obesity -Body mass index is 37.6.  Coronary artery disease with chronic systolic CHF -See echo report above. -Recently moved to this area in November, has already seen Dr. Quay Burow for evaluation of his peripheral vascular disease. -Has a history of a quadruple bypass 2 years ago, suspect this is an old finding, have recommended he follow-up with Dr. Gwenlyn Found within the next weeks. -Continue beta blocker and ACE inhibitor.  Procedures:     as above Consultations:   neurology, Dr. Leonie Man  Discharge Instructions  Discharge Instructions    Ambulatory referral to Neurology    Complete by:  As directed   Please schedule post stroke follow up in 2 months.     Ambulatory referral to Sleep Studies    Complete by:  As directed   Patient with stroke. Has witnessed sleep apnea in the hospital. Consider for sleep study     Diet - low sodium heart healthy    Complete by:  As directed      Increase activity slowly    Complete by:  As directed  Medication List    STOP taking these medications        aspirin 81 MG tablet     insulin aspart 100 UNIT/ML injection  Commonly known as:  NOVOLOG     meloxicam 7.5 MG tablet  Commonly known as:  MOBIC      TAKE these medications        atorvastatin 40 MG tablet  Commonly known as:  LIPITOR  Take 1 tablet (40 mg total) by mouth daily.     clopidogrel 75 MG tablet  Commonly  known as:  PLAVIX  Take 1 tablet (75 mg total) by mouth daily.     gabapentin 600 MG tablet  Commonly known as:  NEURONTIN  take 1 and 1/2 tablets by mouth three times a day     glipiZIDE 5 MG 24 hr tablet  Commonly known as:  GLUCOTROL XL  Take 1 tablet (5 mg total) by mouth daily with breakfast.     insulin detemir 100 UNIT/ML injection  Commonly known as:  LEVEMIR  Inject 0.32 mLs (32 Units total) into the skin at bedtime.     lisinopril 20 MG tablet  Commonly known as:  PRINIVIL,ZESTRIL  Take 1 tablet (20 mg total) by mouth daily. Blood pressure     metoprolol tartrate 25 MG tablet  Commonly known as:  LOPRESSOR  Take 12.5 mg by mouth daily.     omeprazole 20 MG capsule  Commonly known as:  PRILOSEC  Take 1 capsule (20 mg total) by mouth daily. Acid reflux     ONE TOUCH ULTRA TEST test strip  Generic drug:  glucose blood  TEST four times a day     sertraline 50 MG tablet  Commonly known as:  ZOLOFT  Take 1 tablet (50 mg total) by mouth daily. For depression     traMADol 50 MG tablet  Commonly known as:  ULTRAM  Take by mouth every 6 (six) hours as needed.       No Known Allergies     Follow-up Information    Follow up with SETHI,PRAMOD, MD In 2 months.   Specialties:  Neurology, Radiology   Why:  Stroke Clinic, Office will call you with appointment date & time   Contact information:   [place] [address] [phone]       Follow up with sleep study.   Why:  Office will call you with appointment date & time      Follow up with [name], NP. Schedule an appointment as soon as possible for a visit in 2 weeks.   Specialty:  Internal Medicine   Contact information:   [place] [address] [phone]       Follow up with Quay Burow, MD. Schedule an appointment as soon as possible for a visit in 2 weeks.   Specialties:  Cardiology, Radiology   Contact information:   [address] [place] [address] [phone]        The results of significant diagnostics from this hospitalization (including imaging, microbiology, ancillary and laboratory) are listed below for reference.    Significant Diagnostic Studies: Dg Chest 2 View  10/04/2014   CLINICAL DATA:  Shortness of breath and cough  EXAM: CHEST - 2 VIEW  COMPARISON:  None.  FINDINGS: Postsurgical changes are noted. The cardiac shadow is within normal limits. The lungs are clear bilaterally. No acute bony abnormality is noted.  IMPRESSION: No  active disease.   Electronically Signed   By: Inez Catalina M.D.   On: 10/04/2014 13:03   Mr Brain Wo Contrast  10/04/2014   CLINICAL DATA:  59 year old male with left side numbness and weakness for 2 days. Unresolved symptoms. Initial encounter.  EXAM: MRI HEAD WITHOUT CONTRAST  TECHNIQUE: Multiplanar, multiecho pulse sequences of the brain and surrounding structures were obtained without intravenous contrast.  COMPARISON:  None.  FINDINGS: Clustered small foci of abnormal trace diffusion in the right peri-Rolandic cortex and subcortical white matter near the right motor strip (series 9, image 13). The most confluent foci do appear mildly restricted on ADC (series 300, image 38). Mild T2 and FLAIR hyperintensity associated. There is evidence of associated petechial hemorrhage (series 7, image 86), but no mass effect.  There are also several small foci of similar abnormal trace diffusion in the right occipital pole involving both cortex and white matter (series 3 images 19-22).  No posterior fossa or contralateral left hemisphere diffusion abnormality.  Major intracranial vascular flow voids are preserved, there is a mild degree of intracranial artery dolichoectasia.  Patchy bilateral cerebral white matter T2 and FLAIR hyper intensity, with areas which most resemble small chronic white matter lacunar infarcts (series 6, image 17). No definite cortical encephalomalacia. Occasional  chronic micro hemorrhages in the brain suggested on susceptibility weighted imaging. Deep gray matter nuclei are within normal limits. Mild nonspecific patchy T2 hyperintensity in the pons. Small chronic right cerebellar lacunar infarct on series 5, image 6.  No midline shift, mass effect, evidence of mass lesion, ventriculomegaly, extra-axial collection or acute intracranial hemorrhage. Cervicomedullary junction and pituitary are within normal limits. Visible internal auditory structures appear normal. Mastoids are clear. Moderate paranasal sinus mucosal thickening. Visualized orbit soft tissues are within normal limits. Scalp soft tissues are within normal limits. Visualized bone marrow signal is within normal limits. Negative for age visualized cervical spine.  IMPRESSION: 1. Small subacute appearing infarcts in the right peri-Rolandic cortex and subcortical white matter. Similar small infarcts in the right occipital pole. Minimal petechial hemorrhage with no mass effect. 2. Evidence of underlying chronic small vessel ischemia in both cerebral hemispheres, and the right cerebellum.   Electronically Signed   By: Genevie Ann M.D.   On: 10/04/2014 18:18   Ct Angio Chest Aorta W/cm &/or Wo/cm  10/04/2014   CLINICAL DATA:  59 year old male with left-sided chest pain and tingling  EXAM: CT ANGIOGRAPHY CHEST WITH CONTRAST  TECHNIQUE: Multidetector CT imaging of the chest was performed using the standard protocol during bolus administration of intravenous contrast. Multiplanar CT image reconstructions and MIPs were obtained to evaluate the vascular anatomy.  CONTRAST:  18mL OMNIPAQUE IOHEXOL 350 MG/ML SOLN  COMPARISON:  Chest x-ray obtained earlier today  FINDINGS: Mediastinum: Unremarkable CT appearance of the thyroid gland. No suspicious mediastinal or hilar adenopathy. No soft tissue mediastinal mass. The thoracic esophagus is unremarkable.  Heart/Vascular: On the initial unenhanced images, there is no evidence of  crescentic high attenuation in the vascular media to suggest acute intramural hematoma. Following a opacification with intravenous contrast, there is no evidence of dissection or aneurysm. Conventional 3 vessel arch anatomy. Scant atherosclerotic vascular calcifications without evidence of stenosis. Normal caliber main and central pulmonary arteries without evidence of central PE. There is cardiomegaly with left ventricular dilatation. Extensive calcifications are present throughout the coronary arteries. Post surgical changes suggest prior 2 vessel CABG. Aortic to distal RCA graft opacifies with contrast material as does the aorta to mid to  distal LAD.  Lungs/Pleura: Trace dependent atelectasis. Otherwise, the lungs are clear.  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 No acute fracture or aggressive appearing lytic or blastic osseous lesion. Multilevel degenerative disc disease.  Upper Abdomen: Visualized upper abdominal organs are unremarkable.  Review of the MIP images confirms the above findings.  IMPRESSION: 1. No evidence of acute aortic dissection, aneurysm or other acute vascular abnormality. 2. Cardiomegaly with left ventricular dilatation. 3. Advanced calcification of the coronary arteries. Post surgical changes of prior multivessel CABG. Although evaluation is limited by non cardiac gated technique, the proximal aspect of the bypass grafts opacify with contrast material suggesting patency.   Electronically Signed   By: Jacqulynn Cadet M.D.   On: 10/04/2014 15:04    Microbiology: No results found for this or any previous visit (from the past 240 hour(s)).   Labs: Basic Metabolic Panel:  Recent Labs Lab 10/04/14 1100 10/04/14 1204 10/05/14 0540 10/06/14 0515  NA 132* 137  --  134*  K 5.1 4.6  --  3.6  CL 101 102  --  101  CO2 23  --   --  25  GLUCOSE 393* 403*  --  163*  BUN 29* 37*  --  28*    CREATININE 0.92 0.90  --  1.15  CALCIUM 8.5*  --   --  8.6*  MG  --   --  1.9  --    Liver Function Tests:  Recent Labs Lab 10/04/14 1100 10/06/14 0515  AST 34 15  ALT 18 19  ALKPHOS 80 59  BILITOT 1.2 0.6  PROT 7.8 7.6  ALBUMIN 3.3* 3.1*   No results for input(s): LIPASE, AMYLASE in the last 168 hours. No results for input(s): AMMONIA in the last 168 hours. CBC:  Recent Labs Lab 10/04/14 1100 10/04/14 1204 10/06/14 0515  WBC 6.9  --  6.4  NEUTROABS 4.0  --   --   HGB 15.2 16.3 13.9  HCT 43.5 48.0 41.7  MCV 88.4  --  89.1  PLT 282  --  246   Cardiac Enzymes:  Recent Labs Lab 10/04/14 2057  TROPONINI 0.03   BNP: BNP (last 3 results) No results for input(s): BNP in the last 8760 hours.  ProBNP (last 3 results) No results for input(s): PROBNP in the last 8760 hours.  CBG:  Recent Labs Lab 10/06/14 0011 10/06/14 0401 10/06/14 0419 10/06/14 0728 10/06/14 1146  GLUCAP 238* 171* 165* 144* 220*       Signed:  HERNANDEZ ACOSTA,ESTELA  [facility] Pager: [phone] 10/06/2014, 1:38 PM

## 2014-10-08 NOTE — Patient Outreach (Signed)
[facility]) Care Management  10/08/2014  Thomas Sosa 05-08-1955 [id number]   Patient agreed to receive Emmi Transition Stroke series. [facility]-Care Management will follow-up if necessary per Emmi daily dashboard report.

## 2014-10-10 ENCOUNTER — Inpatient Hospital Stay (HOSPITAL_COMMUNITY)
Admission: EM | Admit: 2014-10-10 | Discharge: 2014-10-14 | DRG: 982 | Disposition: A | Discharge status: Home / Self Care | Payer: Medicare HMO | Attending: Internal Medicine | Admitting: Internal Medicine

## 2014-10-10 ENCOUNTER — Emergency Department (HOSPITAL_COMMUNITY): Payer: Medicare HMO

## 2014-10-10 ENCOUNTER — Encounter (HOSPITAL_COMMUNITY): Payer: Self-pay

## 2014-10-10 DIAGNOSIS — I493 Ventricular premature depolarization: Secondary | ICD-10-CM | POA: Diagnosis not present

## 2014-10-10 DIAGNOSIS — Z794 Long term (current) use of insulin: Secondary | ICD-10-CM | POA: Diagnosis not present

## 2014-10-10 DIAGNOSIS — E1159 Type 2 diabetes mellitus with other circulatory complications: Secondary | ICD-10-CM | POA: Diagnosis not present

## 2014-10-10 DIAGNOSIS — E1140 Type 2 diabetes mellitus with diabetic neuropathy, unspecified: Secondary | ICD-10-CM | POA: Diagnosis not present

## 2014-10-10 DIAGNOSIS — Z8673 Personal history of transient ischemic attack (TIA), and cerebral infarction without residual deficits: Secondary | ICD-10-CM | POA: Diagnosis not present

## 2014-10-10 DIAGNOSIS — Z87891 Personal history of nicotine dependence: Secondary | ICD-10-CM

## 2014-10-10 DIAGNOSIS — I472 Ventricular tachycardia: Secondary | ICD-10-CM | POA: Diagnosis not present

## 2014-10-10 DIAGNOSIS — Z7902 Long term (current) use of antithrombotics/antiplatelets: Secondary | ICD-10-CM | POA: Diagnosis not present

## 2014-10-10 DIAGNOSIS — Z951 Presence of aortocoronary bypass graft: Secondary | ICD-10-CM | POA: Diagnosis present

## 2014-10-10 DIAGNOSIS — E1142 Type 2 diabetes mellitus with diabetic polyneuropathy: Secondary | ICD-10-CM | POA: Diagnosis present

## 2014-10-10 DIAGNOSIS — I739 Peripheral vascular disease, unspecified: Secondary | ICD-10-CM | POA: Diagnosis present

## 2014-10-10 DIAGNOSIS — E1165 Type 2 diabetes mellitus with hyperglycemia: Secondary | ICD-10-CM | POA: Diagnosis present

## 2014-10-10 DIAGNOSIS — I10 Essential (primary) hypertension: Secondary | ICD-10-CM | POA: Diagnosis present

## 2014-10-10 DIAGNOSIS — Z8546 Personal history of malignant neoplasm of prostate: Secondary | ICD-10-CM

## 2014-10-10 DIAGNOSIS — I63411 Cerebral infarction due to embolism of right middle cerebral artery: Secondary | ICD-10-CM | POA: Diagnosis not present

## 2014-10-10 DIAGNOSIS — I5022 Chronic systolic (congestive) heart failure: Secondary | ICD-10-CM | POA: Diagnosis not present

## 2014-10-10 DIAGNOSIS — Z96652 Presence of left artificial knee joint: Secondary | ICD-10-CM | POA: Diagnosis present

## 2014-10-10 DIAGNOSIS — I6340 Cerebral infarction due to embolism of unspecified cerebral artery: Principal | ICD-10-CM | POA: Diagnosis present

## 2014-10-10 DIAGNOSIS — E119 Type 2 diabetes mellitus without complications: Secondary | ICD-10-CM

## 2014-10-10 DIAGNOSIS — E785 Hyperlipidemia, unspecified: Secondary | ICD-10-CM | POA: Diagnosis present

## 2014-10-10 DIAGNOSIS — I2510 Atherosclerotic heart disease of native coronary artery without angina pectoris: Secondary | ICD-10-CM | POA: Diagnosis present

## 2014-10-10 DIAGNOSIS — Z79899 Other long term (current) drug therapy: Secondary | ICD-10-CM | POA: Diagnosis not present

## 2014-10-10 DIAGNOSIS — R471 Dysarthria and anarthria: Secondary | ICD-10-CM | POA: Diagnosis present

## 2014-10-10 DIAGNOSIS — I639 Cerebral infarction, unspecified: Secondary | ICD-10-CM | POA: Diagnosis present

## 2014-10-10 DIAGNOSIS — G4733 Obstructive sleep apnea (adult) (pediatric): Secondary | ICD-10-CM | POA: Diagnosis present

## 2014-10-10 DIAGNOSIS — Z959 Presence of cardiac and vascular implant and graft, unspecified: Secondary | ICD-10-CM

## 2014-10-10 DIAGNOSIS — I255 Ischemic cardiomyopathy: Secondary | ICD-10-CM | POA: Diagnosis present

## 2014-10-10 HISTORY — DX: Cerebral infarction, unspecified: I63.9

## 2014-10-10 LAB — COMPREHENSIVE METABOLIC PANEL
ALT: 22 U/L (ref 17–63)
ANION GAP: 7 (ref 5–15)
AST: 18 U/L (ref 15–41)
Albumin: 3.3 g/dL — ABNORMAL LOW (ref 3.5–5.0)
Alkaline Phosphatase: 69 U/L (ref 38–126)
BUN: 18 mg/dL (ref 6–20)
CALCIUM: 9.4 mg/dL (ref 8.9–10.3)
CO2: 27 mmol/L (ref 22–32)
Chloride: 102 mmol/L (ref 101–111)
Creatinine, Ser: 0.95 mg/dL (ref 0.61–1.24)
GFR calc non Af Amer: >60 mL/min (ref >60)
GLUCOSE: 329 mg/dL — AB (ref 65–99)
Potassium: 4.3 mmol/L (ref 3.5–5.1)
Sodium: 136 mmol/L (ref 135–145)
Total Bilirubin: 0.6 mg/dL (ref 0.3–1.2)
Total Protein: 7.7 g/dL (ref 6.5–8.1)

## 2014-10-10 LAB — CBC
HCT: 42 % (ref 39.0–52.0)
Hemoglobin: 14.7 g/dL (ref 13.0–17.0)
MCH: 30.7 pg (ref 26.0–34.0)
MCHC: 35 g/dL (ref 30.0–36.0)
MCV: 87.7 fL (ref 78.0–100.0)
PLATELETS: 270 10*3/uL (ref 150–400)
RBC: 4.79 MIL/uL (ref 4.22–5.81)
RDW: 12.6 % (ref 11.5–15.5)
WBC: 6.9 10*3/uL (ref 4.0–10.5)

## 2014-10-10 LAB — I-STAT CHEM 8, ED
BUN: 22 mg/dL — ABNORMAL HIGH (ref 6–20)
Calcium, Ion: 1.27 mmol/L — ABNORMAL HIGH (ref 1.12–1.23)
Chloride: 100 mmol/L — ABNORMAL LOW (ref 101–111)
Creatinine, Ser: 0.9 mg/dL (ref 0.61–1.24)
Glucose, Bld: 332 mg/dL — ABNORMAL HIGH (ref 65–99)
HEMATOCRIT: 47 % (ref 39.0–52.0)
HEMOGLOBIN: 16 g/dL (ref 13.0–17.0)
POTASSIUM: 4.3 mmol/L (ref 3.5–5.1)
Sodium: 139 mmol/L (ref 135–145)
TCO2: 26 mmol/L (ref 0–100)

## 2014-10-10 LAB — DIFFERENTIAL
Basophils Absolute: 0 10*3/uL (ref 0.0–0.1)
Basophils Relative: 0 % (ref 0–1)
EOS PCT: 7 % — AB (ref 0–5)
Eosinophils Absolute: 0.5 10*3/uL (ref 0.0–0.7)
Lymphocytes Relative: 33 % (ref 12–46)
Lymphs Abs: 2.3 10*3/uL (ref 0.7–4.0)
MONOS PCT: 7 % (ref 3–12)
Monocytes Absolute: 0.5 10*3/uL (ref 0.1–1.0)
NEUTROS PCT: 53 % (ref 43–77)
Neutro Abs: 3.7 10*3/uL (ref 1.7–7.7)

## 2014-10-10 LAB — GLUCOSE, CAPILLARY
GLUCOSE-CAPILLARY: 256 mg/dL — AB (ref 65–99)
Glucose-Capillary: 239 mg/dL — ABNORMAL HIGH (ref 65–99)

## 2014-10-10 LAB — PROTIME-INR
INR: 1.13 (ref 0.00–1.49)
PROTHROMBIN TIME: 14.7 s (ref 11.6–15.2)

## 2014-10-10 LAB — I-STAT TROPONIN, ED: Troponin i, poc: 0.02 ng/mL (ref 0.00–0.08)

## 2014-10-10 LAB — CBG MONITORING, ED: Glucose-Capillary: 287 mg/dL — ABNORMAL HIGH (ref 65–99)

## 2014-10-10 LAB — APTT: APTT: 28 s (ref 24–37)

## 2014-10-10 MED ORDER — ENOXAPARIN SODIUM 40 MG/0.4ML ~~LOC~~ SOLN
40.0000 mg | SUBCUTANEOUS | Status: DC
  Administered 2014-10-10 – 2014-10-11 (×2): 40 mg via SUBCUTANEOUS
  Filled 2014-10-10 (×2): qty 0.4

## 2014-10-10 MED ORDER — ATORVASTATIN CALCIUM 40 MG PO TABS
40.0000 mg | ORAL_TABLET | Freq: Every day | ORAL | Status: DC
  Administered 2014-10-11: 40 mg via ORAL
  Filled 2014-10-10 (×2): qty 1

## 2014-10-10 MED ORDER — LISINOPRIL 20 MG PO TABS
20.0000 mg | ORAL_TABLET | Freq: Every day | ORAL | Status: DC
  Administered 2014-10-11 – 2014-10-14 (×3): 20 mg via ORAL
  Filled 2014-10-10 (×5): qty 1

## 2014-10-10 MED ORDER — METOCLOPRAMIDE HCL 5 MG/ML IJ SOLN
10.0000 mg | Freq: Once | INTRAMUSCULAR | Status: AC
  Administered 2014-10-10: 10 mg via INTRAVENOUS
  Filled 2014-10-10: qty 2

## 2014-10-10 MED ORDER — METOPROLOL TARTRATE 12.5 MG HALF TABLET
12.5000 mg | ORAL_TABLET | Freq: Every day | ORAL | Status: DC
Start: 2014-10-10 — End: 2014-10-13
  Administered 2014-10-11: 12.5 mg via ORAL
  Filled 2014-10-10 (×3): qty 1

## 2014-10-10 MED ORDER — METOPROLOL TARTRATE 12.5 MG HALF TABLET: 12.5000 mg | ORAL_TABLET | Freq: Every day | ORAL | Status: DC

## 2014-10-10 MED ORDER — KETOROLAC TROMETHAMINE 30 MG/ML IJ SOLN
30.0000 mg | Freq: Once | INTRAMUSCULAR | Status: AC
  Administered 2014-10-10: 30 mg via INTRAVENOUS
  Filled 2014-10-10: qty 1

## 2014-10-10 MED ORDER — STROKE: EARLY STAGES OF RECOVERY BOOK
Freq: Once | Status: AC
  Administered 2014-10-11: 08:00:00

## 2014-10-10 MED ORDER — ASPIRIN EC 81 MG PO TBEC
81.0000 mg | DELAYED_RELEASE_TABLET | Freq: Every day | ORAL | Status: DC
  Administered 2014-10-11 – 2014-10-14 (×4): 81 mg via ORAL
  Filled 2014-10-10 (×4): qty 1

## 2014-10-10 MED ORDER — PANTOPRAZOLE SODIUM 40 MG IV SOLR
40.0000 mg | Freq: Every day | INTRAVENOUS | Status: DC
  Administered 2014-10-10 – 2014-10-13 (×4): 40 mg via INTRAVENOUS
  Filled 2014-10-10 (×4): qty 40

## 2014-10-10 MED ORDER — ASPIRIN 300 MG RE SUPP
300.0000 mg | Freq: Once | RECTAL | Status: DC
  Filled 2014-10-10: qty 1

## 2014-10-10 MED ORDER — CLOPIDOGREL BISULFATE 75 MG PO TABS
75.0000 mg | ORAL_TABLET | Freq: Every day | ORAL | Status: DC
  Administered 2014-10-11 – 2014-10-14 (×4): 75 mg via ORAL
  Filled 2014-10-10 (×5): qty 1

## 2014-10-10 MED ORDER — LISINOPRIL 20 MG PO TABS: 20.0000 mg | ORAL_TABLET | Freq: Every day | ORAL | Status: DC

## 2014-10-10 MED ORDER — INSULIN ASPART 100 UNIT/ML ~~LOC~~ SOLN
0.0000 [IU] | Freq: Three times a day (TID) | SUBCUTANEOUS | Status: DC
Start: 2014-10-11 — End: 2014-10-11

## 2014-10-10 MED ORDER — INSULIN DETEMIR 100 UNIT/ML ~~LOC~~ SOLN
16.0000 [IU] | Freq: Every day | SUBCUTANEOUS | Status: DC
  Administered 2014-10-11: 16 [IU] via SUBCUTANEOUS
  Filled 2014-10-10 (×2): qty 0.16

## 2014-10-10 MED ORDER — DIPHENHYDRAMINE HCL 50 MG/ML IJ SOLN
25.0000 mg | Freq: Once | INTRAMUSCULAR | Status: AC
  Administered 2014-10-10: 25 mg via INTRAVENOUS
  Filled 2014-10-10: qty 1

## 2014-10-10 NOTE — ED Provider Notes (Signed)
CSN: [id number]     Arrival date & time 10/10/14  1610 History   First MD Initiated Contact with Patient 10/10/14 1624     Chief Complaint  Patient presents with  . Numbness     (Consider location/radiation/quality/duration/timing/severity/associated sxs/prior Treatment) The history is provided by the patient.  Gaberial Cada is a 59 y.o. male hx of HTN, DM, HL, recent R cortical and subcortical stroke here with left-sided weakness. States that he left the hospital 4 days ago and was feeling fine until yesterday. Started developing left arm numbness and weakness since yesterday. He thought that he will go away so he waited until today to come in for evaluation. Denies any slurred speech or trouble walking. He was supposed to take Plavix upon discharge but he hasn't been doing so.    Past Medical History  Diagnosis Date  . Hypertension   . Diabetes mellitus without complication   . Diabetic neuropathy Dx 2011  . Cancer of prostate Dx 2005  . Hyperlipidemia   . Coronary artery disease   . Tobacco abuse   . Peripheral arterial disease   . Stroke    Past Surgical History  Procedure Laterality Date  . 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 Heart Problems Mother   . Heart Problems Father    History  Substance Use Topics  . Smoking status: Former Smoker    Quit date: 03/26/2014  . Smokeless tobacco: Not on file  . Alcohol Use: No    Review of Systems  Neurological: Positive for dizziness.  All other systems reviewed and are negative.     Allergies  Review of patient's allergies indicates no known allergies.  Home Medications   Prior to Admission medications   Medication Sig Start Date End Date Taking? Authorizing Provider  atorvastatin (LIPITOR) 40 MG tablet Take 1 tablet (40 mg total) by mouth daily. 04/12/14  Yes Lorretta Harp, MD  gabapentin (NEURONTIN) 600 MG tablet take 1 and 1/2 tablets by mouth three times  a day 08/24/14  Yes [name], NP  glipiZIDE (GLUCOTROL XL) 5 MG 24 hr tablet Take 1 tablet (5 mg total) by mouth daily with breakfast. 05/25/14  Yes [name], NP  ibuprofen (ADVIL,MOTRIN) 200 MG tablet Take 400 mg by mouth daily as needed for headache.   Yes Historical Provider, MD  insulin aspart (NOVOLOG) 100 UNIT/ML injection Inject 14 Units into the skin 3 (three) times daily before meals. < 400 = 14 units; > 400 = call MD   Yes Historical Provider, MD  insulin detemir (LEVEMIR) 100 UNIT/ML injection Inject 0.32 mLs (32 Units total) into the skin at bedtime. Patient taking differently: Inject 35 Units into the skin 2 (two) times daily.  01/29/14  Yes Billy Fischer, MD  lisinopril (PRINIVIL,ZESTRIL) 20 MG tablet Take 1 tablet (20 mg total) by mouth daily. Blood pressure 05/12/14  Yes [name], NP  metoprolol tartrate (LOPRESSOR) 25 MG tablet Take 12.5 mg by mouth daily.    Yes Historical Provider, MD  omeprazole (PRILOSEC) 20 MG capsule Take 1 capsule (20 mg total) by mouth daily. Acid reflux 02/22/14  Yes [name], NP  sertraline (ZOLOFT) 50 MG tablet Take 1 tablet (50 mg total) by mouth daily. For depression 02/22/14  Yes [name], NP  traMADol (ULTRAM) 50 MG tablet Take 100 mg by mouth every 6 (six) hours as needed for  moderate pain.    Yes Historical Provider, MD  clopidogrel (PLAVIX) 75 MG tablet Take 1 tablet (75 mg total) by mouth daily. 10/06/14   Erline Hau, MD  ONE TOUCH ULTRA TEST test strip TEST four times a day 06/07/14   [name], NP   BP 164/105 mmHg  Pulse 82  Temp(Src) 98.7 F (37.1 C) (Oral)  Resp 25  SpO2 97% Physical Exam  Constitutional: He is oriented to person, place, and time. He appears well-developed and well-nourished.  HENT:  Head: Normocephalic.  Mouth/Throat: Oropharynx is clear and moist.  Eyes: Conjunctivae are normal. Pupils are equal, round, and reactive to light.  Neck: Normal range of motion. Neck supple.   Cardiovascular: Normal rate, regular rhythm and normal heart sounds.   Pulmonary/Chest: Effort normal and breath sounds normal. No respiratory distress. He has no wheezes. He has no rales.  Abdominal: Soft. Bowel sounds are normal. He exhibits no distension. There is no tenderness. There is no rebound.  Musculoskeletal: Normal range of motion. He exhibits no edema or tenderness.  Neurological: He is alert and oriented to person, place, and time.  No obvious facial droop. Dec sensation L arm. 4/5 strength L arm. Sensation and motor otherwise intact. Nl speech   Skin: Skin is warm and dry.  Psychiatric: He has a normal mood and affect. His behavior is normal. Judgment and thought content normal.  Nursing note and vitals reviewed.   ED Course  Procedures (including critical care time) Labs Review Labs Reviewed  DIFFERENTIAL - Abnormal; Notable for the following:    Eosinophils Relative 7 (*)    All other components within normal limits  COMPREHENSIVE METABOLIC PANEL - Abnormal; Notable for the following:    Glucose, Bld 329 (*)    Albumin 3.3 (*)    All other components within normal limits  CBG MONITORING, ED - Abnormal; Notable for the following:    Glucose-Capillary 287 (*)    All other components within normal limits  I-STAT CHEM 8, ED - Abnormal; Notable for the following:    Chloride 100 (*)    BUN 22 (*)    Glucose, Bld 332 (*)    Calcium, Ion 1.27 (*)    All other components within normal limits  PROTIME-INR  APTT  CBC  I-STAT TROPOININ, ED    Imaging Review Mr Brain Wo Contrast  10/10/2014   CLINICAL DATA:  Left-sided arm numbness, onset today. Recent stroke.  EXAM: MRI HEAD WITHOUT CONTRAST  TECHNIQUE: Multiplanar, multiecho pulse sequences of the brain and surrounding structures were obtained without intravenous contrast.  COMPARISON:  10/04/2014  FINDINGS: Calvarium and upper cervical spine: No focal marrow signal abnormality.  Orbits: No significant findings.   Sinuses and Mastoids: Moderate scattered inflammatory mucosal thickening in the paranasal sinuses. Mastoid and middle ears are clear.  Brain: In addition to the acute infarcts seen 10/04/2014, there are multiple additional acute infarcts in the same general distribution, along the high right frontal parietal convexity, right corona radiata, and in the cortical and subcortical right occipital lobe. The Pattern is best ascribed to MCA and MCA watershed. No infratentorial or contralateral infarcts. Minimal petechial hemorrhage in the high right frontal parietal cortex is stable from previous. Noted negative carotid Doppler, CTA may be useful to evaluate for ulcerated plaque or intracranial stenosis.  Moderate white matter disease, mainly periventricular and deep, likely chronic small vessel disease. Remote lacunar infarct noted in the right periatrial white matter.  No evidence of major  vessel occlusion, hydrocephalus, or mass lesion.  IMPRESSION: Since 10/04/2014, increased number of acute infarcts in the right cerebral hemisphere, MCA and border zone arterial distribution. Petechial hemorrhage is minimal and stable from 10/04/2014.   Electronically Signed   By: Monte Fantasia M.D.   On: 10/10/2014 18:09     EKG Interpretation   Date/Time:  Sunday October 10 2014 16:19:52 EDT Ventricular Rate:  94 PR Interval:  156 QRS Duration: 112 QT Interval:  394 QTC Calculation: 492 R Axis:   11 Text Interpretation:  Normal sinus rhythm Biatrial enlargement Incomplete  left bundle branch block T wave abnormality, consider lateral ischemia  Prolonged QT Abnormal ECG No significant change since last tracing  Confirmed by Ayham Word  MD, Lindsie Simar ([id number]) on 10/10/2014 4:31:30 PM      MDM   Final diagnoses:  None   Riaan Toledo is a 59 y.o. male here with L arm paresthesias. Concerned for new stroke. Consulted Dr. Leonel Ramsay from neurology, who recommend MRI and if its stable can be discharged. He thought likely waxing  and waning symptoms from recent stroke.   8:04 PM MRI showed new acute infarcts. Dr. Aram Beecham from neurology saw patient and felt that he likely has new embolic strokes. Recommend TEE and loop recorder and medical admission.    Wandra Arthurs, MD 10/10/14 2004

## 2014-10-10 NOTE — ED Notes (Addendum)
Pt reports onset yesterday left sided numbness arm, abd, leg and neck.  No facial droop, hand grips equal.

## 2014-10-10 NOTE — H&P (Addendum)
History and Physical  Thomas Sosa ZMO:[id number] DOB: 11-25-1955 DOA: 10/10/2014   PCP: [name], NP   Chief Complaint: numbness  HPI: Thomas Sosa is a 59 y.o. 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 He said the weakness disappeared in 30 min but the numbness is persistent. He also had slurred speech and dysphagia that improved too. He denied facial droop but his left Kreamer is numb. No ocular symptoms or double vision. He is complaint with his medication.     Review of Systems:  12 point review of systems reviewed and negative except as per HPI.   Past Medical History  Diagnosis Date  . Hypertension   . Diabetes mellitus without complication   . Diabetic neuropathy Dx 2011  . Cancer of prostate Dx 2005  . Hyperlipidemia   . Coronary artery disease   . Tobacco abuse   . Peripheral arterial disease   . Stroke    Past Surgical History  Procedure Laterality Date  . Replacement total knee  Lt 2013/ Rt 2005   Social History:  reports that he quit smoking about 6 months ago. He does not have any smokeless tobacco history on file. He reports that he does not drink alcohol. His drug history is not on file. Patient lives at home.   No Known Allergies  Family History  Problem Relation Age of Onset  . Heart Problems Mother   . Heart Problems Father     Prior to Admission medications   Medication Sig Start Date End Date Taking? Authorizing Provider  atorvastatin (LIPITOR) 40 MG tablet Take 1 tablet (40 mg total) by mouth daily. 04/12/14  Yes Lorretta Harp, MD  gabapentin (NEURONTIN) 600 MG tablet take 1 and 1/2 tablets by mouth three times a day 08/24/14  Yes [name], NP  glipiZIDE (GLUCOTROL XL) 5 MG 24 hr  tablet Take 1 tablet (5 mg total) by mouth daily with breakfast. 05/25/14  Yes [name], NP  ibuprofen (ADVIL,MOTRIN) 200 MG tablet Take 400 mg by mouth daily as needed for headache.   Yes Historical Provider, MD  insulin aspart (NOVOLOG) 100 UNIT/ML injection Inject 14 Units into the skin 3 (three) times daily before meals. < 400 = 14 units; > 400 = call MD   Yes Historical Provider, MD  insulin detemir (LEVEMIR) 100 UNIT/ML injection Inject 0.32 mLs (32 Units total) into the skin at bedtime. Patient taking differently: Inject 35 Units into the skin 2 (two) times daily.  01/29/14  Yes Billy Fischer, MD  lisinopril (PRINIVIL,ZESTRIL) 20 MG tablet Take 1 tablet (20 mg total) by mouth daily. Blood pressure 05/12/14  Yes [name], NP  metoprolol tartrate (LOPRESSOR) 25 MG tablet Take 12.5 mg by mouth daily.    Yes Historical Provider, MD  omeprazole (PRILOSEC) 20 MG capsule Take 1 capsule (20 mg total) by mouth daily. Acid reflux 02/22/14  Yes [name], NP  sertraline (ZOLOFT) 50 MG tablet Take 1 tablet (50 mg total) by mouth daily. For depression 02/22/14  Yes [name], NP  traMADol (ULTRAM) 50 MG tablet Take 100 mg by mouth every 6 (six) hours as needed for moderate pain.    Yes Historical Provider, MD  clopidogrel (PLAVIX) 75 MG tablet Take 1 tablet (75 mg total) by mouth daily. 10/06/14   Erline Hau, MD  ONE TOUCH ULTRA TEST test strip TEST four times a day 06/07/14   [name], NP    Physical Exam: BP 167/85 mmHg  Pulse 75  Temp(Src) 98.7 F (37.1 C) (Oral)  Resp 15  SpO2 96%  General:  NAD Eyes: pupils equal and reactive to light bilaterally. Non icteric.  ENT: normal nasal and oral mucosa. Neck: supple Cardiovascular: RRR. No M/G/R. Respiratory: CTAB Abdomen: soft, BS+, non tender.  Skin: no rash.  Musculoskeletal: no deformity. Left side weakness 4/5. Neurologic: alert, oriented #4, no aphasia, no facial droops, mild left hemiparesis 4/5,  light touch intact except in bilateral lower extremity below knee level ( patient report chronic numbness due to severe PVD).          Labs on Admission:  Basic Metabolic Panel:  Recent Labs Lab 10/04/14 1100 10/04/14 1204 10/05/14 0540 10/06/14 0515 10/10/14 1625 10/10/14 1653  NA 132* 137  --  134* 136 139  K 5.1 4.6  --  3.6 4.3 4.3  CL 101 102  --  101 102 100*  CO2 23  --   --  25 27  --   GLUCOSE 393* 403*  --  163* 329* 332*  BUN 29* 37*  --  28* 18 22*  CREATININE 0.92 0.90  --  1.15 0.95 0.90  CALCIUM 8.5*  --   --  8.6* 9.4  --   MG  --   --  1.9  --   --   --    Liver Function Tests:  Recent Labs Lab 10/04/14 1100 10/06/14 0515 10/10/14 1625  AST 34 15 18  ALT 18 19 22   ALKPHOS 80 59 69  BILITOT 1.2 0.6 0.6  PROT 7.8 7.6 7.7  ALBUMIN 3.3* 3.1* 3.3*   No results for input(s): LIPASE, AMYLASE in the last 168 hours. No results for input(s): AMMONIA in the last 168 hours. CBC:  Recent Labs Lab 10/04/14 1100 10/04/14 1204 10/06/14 0515 10/10/14 1625 10/10/14 1653  WBC 6.9  --  6.4 6.9  --   NEUTROABS 4.0  --   --  3.7  --   HGB 15.2 16.3 13.9 14.7 16.0  HCT 43.5 48.0 41.7 42.0 47.0  MCV 88.4  --  89.1 87.7  --   PLT 282  --  246 270  --    Cardiac Enzymes:  Recent Labs Lab 10/04/14 2057  TROPONINI 0.03    BNP (last 3 results) No results for input(s): BNP in the last 8760 hours.  ProBNP (last 3 results) No results for input(s): PROBNP in the last 8760 hours.  CBG:  Recent Labs Lab 10/06/14 0419 10/06/14 0728 10/06/14 1146 10/06/14 1619 10/10/14 1645  GLUCAP 165* 144* 220* 213* 287*    Radiological Exams on Admission: Mr Brain Wo Contrast  10/10/2014   CLINICAL DATA:  Left-sided arm numbness, onset today. Recent stroke.  EXAM: MRI HEAD WITHOUT CONTRAST  TECHNIQUE: Multiplanar, multiecho pulse sequences of the brain and surrounding structures were obtained without intravenous contrast.  COMPARISON:  10/04/2014  FINDINGS:  Calvarium and upper cervical spine: No focal marrow signal abnormality.  Orbits: No significant findings.  Sinuses and Mastoids: Moderate scattered inflammatory mucosal thickening in the paranasal sinuses. Mastoid and middle ears are clear.  Brain: In addition to the acute infarcts seen 10/04/2014, there are multiple additional acute infarcts in  the same general distribution, along the high right frontal parietal convexity, right corona radiata, and in the cortical and subcortical right occipital lobe. The Pattern is best ascribed to MCA and MCA watershed. No infratentorial or contralateral infarcts. Minimal petechial hemorrhage in the high right frontal parietal cortex is stable from previous. Noted negative carotid Doppler, CTA may be useful to evaluate for ulcerated plaque or intracranial stenosis.  Moderate white matter disease, mainly periventricular and deep, likely chronic small vessel disease. Remote lacunar infarct noted in the right periatrial white matter.  No evidence of major vessel occlusion, hydrocephalus, or mass lesion.  IMPRESSION: Since 10/04/2014, increased number of acute infarcts in the right cerebral hemisphere, MCA and border zone arterial distribution. Petechial hemorrhage is minimal and stable from 10/04/2014.   Electronically Signed   By: Monte Fantasia M.D.   On: 10/10/2014 18:09    EKG: Independently reviewed. No ischemic changes.   Assessment/Plan  Acute stroke:  MRI brain showincreased number of acute infarcts in the right cerebral hemisphere, MCA and border zone arterial distribution. Petechial hemorrhage is minimal and stable from 10/04/2014.  Neuro saw patient in ER. They believe it's embolic stroke.  Echo from last week with severe LV dysfunction but no thrombi.  Neuro recs to get TEE: will keep NPO and consult Cardio in am.  Patient failed swallow screen in ER: will order asp suppository. Can't take other po meds: will monitor BP and treat accordingly. Start on  insulin half home long acting dose and moderate correction.  Repeat swallow study in AM with PT/OT consult. Carotid duplex w/o significant obstruction last week Will check LE duplex in am ( no PFO in last week Echo).  HTN: Hold BP meds ( failed swallow screen); will monitor and treat for BP> 160/90   DM: Started on levemir 16 units only with moderate correction as patient is NPO.   GI prophylaxis:  PPI IV  CAD s/p CABG#4: Continue asp Continue plavix once he tolerates PO meds Continue statin and BB/ACEI once patient tolerate PO meds.  PVD:  Continue Plavix and statin once patient tolerate PO meds.  Asp suppository daily.  Consultants: Neurology  Code Status: full  Family Communication: none at bedside.   Disposition Plan: awaiting TEE and neuro recs.   Gennaro Africa MD [facility]

## 2014-10-10 NOTE — Consult Note (Addendum)
Referring Physician: Dr Darl Householder    Chief Complaint: left sided paresthesias-weakness, dysarthria.   HPI:                                                                                                                                         Thomas Sosa is an 59 y.o. male with a past medical history that is relevant for HTN, DM type 2, hyperlipidemia, CAD s/p CABG, tobacco abuse, right peri-Rolandic cortex, right occipital pole and subcortical infarcts on 10/04/14, PAD, diabetic neuropathy, returns to the ED with complains of left sided numbness-tingling, heaviness/weakness left hand and leg, and slurred speech. He indicated that he had " a lot of left sided numbness at the time of my recent stroke, but they went completely away and came back again yesterday morning". Patient tells me that he went to the gym and while exercising in the treadmill started experiencing numbness in the left side of his trunk, left leg, and left Knepp. Subsequently noticed difficulty using his left hand and leg as well as slurred speech. Said that he has been having HA lately, but denies associated vertigo, double vision, difficulty swallowing, confusion, or vision impairment. Takes aspirin daily. MRI brain performed today was personally reviewed and revealed increased number of acute infarcts in the right cerebral hemisphere, MCA and border zone arterial distribution. 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   Date last known well: 10/08/14 Time last known well: unknown. tPA Given: no, out of the window    Past Medical History  Diagnosis Date  . Hypertension   . Diabetes mellitus without complication   . Diabetic neuropathy Dx 2011  . Cancer of prostate Dx 2005  . Hyperlipidemia   . Coronary artery disease   . Tobacco abuse   . Peripheral arterial disease   . Stroke     Past Surgical  History  Procedure Laterality Date  . Replacement total knee  Lt 2013/ Rt 2005    Family History  Problem Relation Age of Onset  . Heart Problems Mother   . Heart Problems Father    Social History:  reports that he quit smoking about 6 months ago. He does not have any smokeless tobacco history on file. He reports that he does not drink alcohol. His drug history is not on file.  Allergies: No Known Allergies  Medications:  I have reviewed the patient's current medications.  ROS:                                                                                                                                       History obtained from chart review and the patient  General ROS: negative for - chills, fatigue, fever, night sweats, weight gain or weight loss Psychological ROS: negative for - behavioral disorder, hallucinations, memory difficulties, mood swings or suicidal ideation Ophthalmic ROS: negative for - blurry vision, double vision, eye pain or loss of vision ENT ROS: negative for - epistaxis, nasal discharge, oral lesions, sore throat, tinnitus or vertigo Allergy and Immunology ROS: negative for - hives or itchy/watery eyes Hematological and Lymphatic ROS: negative for - bleeding problems, bruising or swollen lymph nodes Endocrine ROS: negative for - galactorrhea, hair pattern changes, polydipsia/polyuria or temperature intolerance Respiratory ROS: negative for - cough, hemoptysis, shortness of breath or wheezing Cardiovascular ROS: negative for - chest pain, dyspnea on exertion, edema or irregular heartbeat Gastrointestinal ROS: negative for - abdominal pain, diarrhea, hematemesis, nausea/vomiting or stool incontinence Genito-Urinary ROS: negative for - dysuria, hematuria, incontinence or urinary frequency/urgency Musculoskeletal ROS: negative for - joint  swelling Neurological ROS: as noted in HPI Dermatological ROS: negative for rash and skin lesion changes     Physical exam: pleasant male in no apparent distress. Blood pressure 164/105, pulse 82, temperature 98.7 F (37.1 C), temperature source Oral, resp. rate 25, SpO2 97 %. Head: normocephalic. Neck: supple, no bruits, no JVD. Cardiac: no murmurs. Lungs: clear. Abdomen: soft, no tender, no mass. Extremities: no edema. Skin: no rash Neurologic Examination:                                                                                                      General: Mental Status: Alert, oriented, thought content appropriate. Mild dysarthria without evidence of aphasia.  Able to follow 3 step commands without difficulty. Cranial Nerves: II: Discs flat bilaterally; Visual fields grossly normal, pupils equal, round, reactive to light and accommodation III,IV, VI: ptosis not present, extra-ocular motions intact bilaterally V,VII: smile symmetric, facial light touch sensation normal bilaterally VIII: hearing normal bilaterally IX,X: uvula rises symmetrically XI: bilateral shoulder shrug XII: midline tongue extension without atrophy or fasciculations Motor: Significant for mild left hemiparesis Tone and bulk:normal tone throughout; no atrophy noted Sensory: Pinprick and light touch intact throughout, bilaterally Deep Tendon Reflexes:  1+ all over Plantars: Right: downgoing   Left: downgoing Cerebellar: normal finger-to-nose,  normal heel-to-shin test Gait:  Unable to test due to multiple leads CV: pulses palpable throughout     Results for orders placed or performed during the hospital encounter of 10/10/14 (from the past 48 hour(s))  Protime-INR     Status: None   Collection Time: 10/10/14  4:25 PM  Result Value Ref Range   Prothrombin Time 14.7 11.6 - 15.2 seconds   INR 1.13 0.00 - 1.49  APTT     Status: None   Collection Time: 10/10/14  4:25 PM  Result Value Ref Range    aPTT 28 24 - 37 seconds  CBC     Status: None   Collection Time: 10/10/14  4:25 PM  Result Value Ref Range   WBC 6.9 4.0 - 10.5 K/uL   RBC 4.79 4.22 - 5.81 MIL/uL   Hemoglobin 14.7 13.0 - 17.0 g/dL   HCT 42.0 39.0 - 52.0 %   MCV 87.7 78.0 - 100.0 fL   MCH 30.7 26.0 - 34.0 pg   MCHC 35.0 30.0 - 36.0 g/dL   RDW 12.6 11.5 - 15.5 %   Platelets 270 150 - 400 K/uL  Differential     Status: Abnormal   Collection Time: 10/10/14  4:25 PM  Result Value Ref Range   Neutrophils Relative % 53 43 - 77 %   Neutro Abs 3.7 1.7 - 7.7 K/uL   Lymphocytes Relative 33 12 - 46 %   Lymphs Abs 2.3 0.7 - 4.0 K/uL   Monocytes Relative 7 3 - 12 %   Monocytes Absolute 0.5 0.1 - 1.0 K/uL   Eosinophils Relative 7 (H) 0 - 5 %   Eosinophils Absolute 0.5 0.0 - 0.7 K/uL   Basophils Relative 0 0 - 1 %   Basophils Absolute 0.0 0.0 - 0.1 K/uL  Comprehensive metabolic panel     Status: Abnormal   Collection Time: 10/10/14  4:25 PM  Result Value Ref Range   Sodium 136 135 - 145 mmol/L   Potassium 4.3 3.5 - 5.1 mmol/L   Chloride 102 101 - 111 mmol/L   CO2 27 22 - 32 mmol/L   Glucose, Bld 329 (H) 65 - 99 mg/dL   BUN 18 6 - 20 mg/dL   Creatinine, Ser 0.95 0.61 - 1.24 mg/dL   Calcium 9.4 8.9 - 10.3 mg/dL   Total Protein 7.7 6.5 - 8.1 g/dL   Albumin 3.3 (L) 3.5 - 5.0 g/dL   AST 18 15 - 41 U/L   ALT 22 17 - 63 U/L   Alkaline Phosphatase 69 38 - 126 U/L   Total Bilirubin 0.6 0.3 - 1.2 mg/dL   GFR calc non Af Amer >60 >60 mL/min   GFR calc Af Amer >60 >60 mL/min    Comment: (NOTE) The eGFR has been calculated using the CKD EPI equation. This calculation has not been validated in all clinical situations. eGFR's persistently <60 mL/min signify possible Chronic Kidney Disease.    Anion gap 7 5 - 15  I-stat troponin, ED (not at [facility], [facility])     Status: None   Collection Time: 10/10/14  4:40 PM  Result Value Ref Range   Troponin i, poc 0.02 0.00 - 0.08 ng/mL   Comment 3            Comment: Due to the release  kinetics of cTnI, a negative result within the first hours of the onset of symptoms does not rule out myocardial infarction with certainty. If myocardial infarction is still  suspected, repeat the test at appropriate intervals.   CBG monitoring, ED     Status: Abnormal   Collection Time: 10/10/14  4:45 PM  Result Value Ref Range   Glucose-Capillary 287 (H) 65 - 99 mg/dL  I-Stat Chem 8, ED  (not at [facility], [facility])     Status: Abnormal   Collection Time: 10/10/14  4:53 PM  Result Value Ref Range   Sodium 139 135 - 145 mmol/L   Potassium 4.3 3.5 - 5.1 mmol/L   Chloride 100 (L) 101 - 111 mmol/L   BUN 22 (H) 6 - 20 mg/dL   Creatinine, Ser 0.90 0.61 - 1.24 mg/dL   Glucose, Bld 332 (H) 65 - 99 mg/dL   Calcium, Ion 1.27 (H) 1.12 - 1.23 mmol/L   TCO2 26 0 - 100 mmol/L   Hemoglobin 16.0 13.0 - 17.0 g/dL   HCT 47.0 39.0 - 52.0 %   Mr Brain Wo Contrast  10/10/2014   CLINICAL DATA:  Left-sided arm numbness, onset today. Recent stroke.  EXAM: MRI HEAD WITHOUT CONTRAST  TECHNIQUE: Multiplanar, multiecho pulse sequences of the brain and surrounding structures were obtained without intravenous contrast.  COMPARISON:  10/04/2014  FINDINGS: Calvarium and upper cervical spine: No focal marrow signal abnormality.  Orbits: No significant findings.  Sinuses and Mastoids: Moderate scattered inflammatory mucosal thickening in the paranasal sinuses. Mastoid and middle ears are clear.  Brain: In addition to the acute infarcts seen 10/04/2014, there are multiple additional acute infarcts in the same general distribution, along the high right frontal parietal convexity, right corona radiata, and in the cortical and subcortical right occipital lobe. The Pattern is best ascribed to MCA and MCA watershed. No infratentorial or contralateral infarcts. Minimal petechial hemorrhage in the high right frontal parietal cortex is stable from previous. Noted negative carotid Doppler, CTA may be useful to evaluate for ulcerated plaque or  intracranial stenosis.  Moderate white matter disease, mainly periventricular and deep, likely chronic small vessel disease. Remote lacunar infarct noted in the right periatrial white matter.  No evidence of major vessel occlusion, hydrocephalus, or mass lesion.  IMPRESSION: Since 10/04/2014, increased number of acute infarcts in the right cerebral hemisphere, MCA and border zone arterial distribution. Petechial hemorrhage is minimal and stable from 10/04/2014.   Electronically Signed   By: Monte Fantasia M.D.   On: 10/10/2014 18:09    Assessment: 59 y.o. male with multiple risk factors for stroke, presents with reemergence of left sided paresthesias in association with mild left sided weakness and dysarthria, and MRI brain new areas of acute infarction that seem to follow a watershed right MCA-ACA distribution. I presumed this is due to cerebral embolism from unknown source so far, although notably patient indicated that he was diagnosed with " disorganized heart rhythm after cardiac surgery last surgery". It appears that he will ultimately need loop recorder placement and TEE. Admit to medicine. Will maintain him on aspirin pending further stroke attending recommendations. PT.   Stroke Risk Factors - HTN, DM type 2, hyperlipidemia, tobacco abuse, prior stroke   Dorian Pod, MD [facility] [phone]  10/10/2014, 7:27 PM

## 2014-10-10 NOTE — ED Notes (Signed)
Attempted report 

## 2014-10-10 NOTE — ED Notes (Signed)
Pt returned from MRI °

## 2014-10-10 NOTE — ED Notes (Signed)
Admitting MD notified pt NPO, MD to order aspirin per rectum.

## 2014-10-11 ENCOUNTER — Inpatient Hospital Stay (HOSPITAL_COMMUNITY): Payer: Medicare HMO

## 2014-10-11 ENCOUNTER — Encounter (HOSPITAL_COMMUNITY)
Admission: EM | Disposition: A | Discharge status: Home / Self Care | Payer: Self-pay | Source: Home / Self Care | Attending: Internal Medicine

## 2014-10-11 ENCOUNTER — Encounter (HOSPITAL_COMMUNITY): Payer: Self-pay | Admitting: *Deleted

## 2014-10-11 DIAGNOSIS — I5022 Chronic systolic (congestive) heart failure: Secondary | ICD-10-CM

## 2014-10-11 DIAGNOSIS — I639 Cerebral infarction, unspecified: Secondary | ICD-10-CM

## 2014-10-11 DIAGNOSIS — E785 Hyperlipidemia, unspecified: Secondary | ICD-10-CM

## 2014-10-11 DIAGNOSIS — I10 Essential (primary) hypertension: Secondary | ICD-10-CM

## 2014-10-11 DIAGNOSIS — I739 Peripheral vascular disease, unspecified: Secondary | ICD-10-CM

## 2014-10-11 DIAGNOSIS — I2510 Atherosclerotic heart disease of native coronary artery without angina pectoris: Secondary | ICD-10-CM

## 2014-10-11 DIAGNOSIS — E1140 Type 2 diabetes mellitus with diabetic neuropathy, unspecified: Secondary | ICD-10-CM

## 2014-10-11 DIAGNOSIS — E1159 Type 2 diabetes mellitus with other circulatory complications: Secondary | ICD-10-CM

## 2014-10-11 HISTORY — PX: TEE WITHOUT CARDIOVERSION: SHX5443

## 2014-10-11 LAB — GLUCOSE, CAPILLARY
GLUCOSE-CAPILLARY: 241 mg/dL — AB (ref 65–99)
Glucose-Capillary: 285 mg/dL — ABNORMAL HIGH (ref 65–99)
Glucose-Capillary: 282 mg/dL — ABNORMAL HIGH (ref 65–99)
Glucose-Capillary: 289 mg/dL — ABNORMAL HIGH (ref 65–99)
Glucose-Capillary: 277 mg/dL — ABNORMAL HIGH (ref 65–99)

## 2014-10-11 SURGERY — ECHOCARDIOGRAM, TRANSESOPHAGEAL
Anesthesia: Moderate Sedation

## 2014-10-11 MED ORDER — INSULIN DETEMIR 100 UNIT/ML ~~LOC~~ SOLN
35.0000 [IU] | Freq: Two times a day (BID) | SUBCUTANEOUS | Status: DC
  Administered 2014-10-11 – 2014-10-14 (×5): 35 [IU] via SUBCUTANEOUS
  Filled 2014-10-11 (×8): qty 0.35

## 2014-10-11 MED ORDER — TRAMADOL HCL 50 MG PO TABS
100.0000 mg | ORAL_TABLET | Freq: Four times a day (QID) | ORAL | Status: DC | PRN
  Administered 2014-10-11 – 2014-10-14 (×4): 100 mg via ORAL
  Filled 2014-10-11 (×4): qty 2

## 2014-10-11 MED ORDER — SERTRALINE HCL 50 MG PO TABS
50.0000 mg | ORAL_TABLET | Freq: Every day | ORAL | Status: DC
  Administered 2014-10-11 – 2014-10-14 (×4): 50 mg via ORAL
  Filled 2014-10-11 (×4): qty 1

## 2014-10-11 MED ORDER — FENTANYL CITRATE (PF) 100 MCG/2ML IJ SOLN
INTRAMUSCULAR | Status: DC | PRN
  Administered 2014-10-11 (×2): 25 ug via INTRAVENOUS

## 2014-10-11 MED ORDER — MIDAZOLAM HCL 5 MG/ML IJ SOLN
INTRAMUSCULAR | Status: AC
  Filled 2014-10-11: qty 2

## 2014-10-11 MED ORDER — SODIUM CHLORIDE 0.9 % IV SOLN
INTRAVENOUS | Status: DC
  Administered 2014-10-11: 500 mL via INTRAVENOUS

## 2014-10-11 MED ORDER — IOHEXOL 350 MG/ML SOLN
75.0000 mL | Freq: Once | INTRAVENOUS | Status: AC | PRN
  Administered 2014-10-11: 100 mL via INTRAVENOUS

## 2014-10-11 MED ORDER — GABAPENTIN 600 MG PO TABS
600.0000 mg | ORAL_TABLET | Freq: Three times a day (TID) | ORAL | Status: DC
  Administered 2014-10-11 – 2014-10-14 (×10): 600 mg via ORAL
  Filled 2014-10-11 (×11): qty 1

## 2014-10-11 MED ORDER — INSULIN ASPART 100 UNIT/ML ~~LOC~~ SOLN
0.0000 [IU] | SUBCUTANEOUS | Status: DC
  Administered 2014-10-11 (×4): 8 [IU] via SUBCUTANEOUS
  Administered 2014-10-12: 3 [IU] via SUBCUTANEOUS
  Administered 2014-10-12: 8 [IU] via SUBCUTANEOUS
  Administered 2014-10-12: 3 [IU] via SUBCUTANEOUS
  Administered 2014-10-12: 5 [IU] via SUBCUTANEOUS
  Administered 2014-10-12: 3 [IU] via SUBCUTANEOUS
  Administered 2014-10-13: 5 [IU] via SUBCUTANEOUS
  Administered 2014-10-13: 2 [IU] via SUBCUTANEOUS
  Administered 2014-10-13: 5 [IU] via SUBCUTANEOUS
  Administered 2014-10-13: 2 [IU] via SUBCUTANEOUS
  Administered 2014-10-14: 5 [IU] via SUBCUTANEOUS
  Administered 2014-10-14: 2 [IU] via SUBCUTANEOUS

## 2014-10-11 MED ORDER — BUTAMBEN-TETRACAINE-BENZOCAINE 2-2-14 % EX AERO
INHALATION_SPRAY | CUTANEOUS | Status: DC | PRN
  Administered 2014-10-11: 2 via TOPICAL

## 2014-10-11 MED ORDER — MIDAZOLAM HCL 10 MG/2ML IJ SOLN
INTRAMUSCULAR | Status: DC | PRN
  Administered 2014-10-11 (×2): 2 mg via INTRAVENOUS

## 2014-10-11 MED ORDER — FENTANYL CITRATE (PF) 100 MCG/2ML IJ SOLN
INTRAMUSCULAR | Status: AC
  Filled 2014-10-11: qty 2

## 2014-10-11 MED ORDER — DIPHENHYDRAMINE HCL 50 MG/ML IJ SOLN
INTRAMUSCULAR | Status: AC
  Filled 2014-10-11: qty 1

## 2014-10-11 MED ORDER — ATORVASTATIN CALCIUM 80 MG PO TABS
80.0000 mg | ORAL_TABLET | Freq: Every day | ORAL | Status: DC
  Administered 2014-10-12 – 2014-10-14 (×3): 80 mg via ORAL
  Filled 2014-10-11 (×3): qty 1

## 2014-10-11 NOTE — Progress Notes (Signed)
RN requested information to be fax to 4N r/t patient's CABG/ECHO records along with discharge summaries x the last 3 years. Fax request and consent to [facility], [place] at [phone]. Confirmation received and placed in chart. Awaiting for record.   Ave Filter, RN

## 2014-10-11 NOTE — Progress Notes (Signed)
Changed orders to q4h glucose checks, patient is NPO until speech eval.

## 2014-10-11 NOTE — Interval H&P Note (Signed)
History and Physical Interval Note:  10/11/2014 9:49 AM  Thomas Sosa  has presented today for surgery, with the diagnosis of stroke  The various methods of treatment have been discussed with the patient and family. After consideration of risks, benefits and other options for treatment, the patient has consented to  Procedure(s): TRANSESOPHAGEAL ECHOCARDIOGRAM (TEE) (N/A) as a surgical intervention .  The patient's history has been reviewed, patient examined, no change in status, stable for surgery.  I have reviewed the patient's chart and labs.  Questions were answered to the patient's satisfaction.     Dorothy Spark

## 2014-10-11 NOTE — Progress Notes (Signed)
Inpatient Diabetes Program Recommendations  AACE/ADA: New Consensus Statement on Inpatient Glycemic Control (2013)  Target Ranges:  Prepandial:   less than 140 mg/dL      Peak postprandial:   less than 180 mg/dL (1-2 hours)      Critically ill patients:  140 - 180 mg/dL   Consult received regarding diabetes management with HgbA1Cof 12%. WIll talk with patient in am. May need an OP education referral. Inpatient Diabetes Program Recommendations Insulin - Meal Coverage: Pt takes meal coverage at home-states he takes 14 units tidwc. Please add meal coverage13 units tidwc. Pt may need an additional agent such as a GLP-1 agonist. Will talk with patient regarding his primary MD and his adherence to his regimen.   Thank you Rosita Kea, RN, MSN, CDE  Diabetes Inpatient Program Office: [phone] Pager: [phone] 8:00 am to 5:00 pm

## 2014-10-11 NOTE — Progress Notes (Addendum)
[facility] PROGRESS NOTE  Thomas Sosa ENI:[id number] DOB: 1955-12-27 DOA: 10/10/2014 PCP: [name], NP  Assessment/Plan: #1 acute stroke MRI knee right MCA territory infarcts in the setting of previous right perirenal Lun[place] Subcortical right occipital infarcts 1 week ago. Likely embolic in nature. CT angiogram head and neck with atherosclerosis disease with stenosis of the carotid siphons, vertebral artery, and proximal basilar artery. 2-D echo which was done during last hospitalization last week at a year for 25-30%. Patient is status post TEE with a EF of 25-30% with diffuse hypokinesis, negative bubble study, no source of emboli. Carotid Dopplers were done during last hospitalization a such will not be repeated. LDL was 100. Hemoglobin A1c is 12.1. Patient currently on Plavix 75 mg daily and aspirin 81 mg daily. Neurology following and appreciate input and recommendations. Patient to also be seen by the EP to decide on whether patient needs ICD versus Holter monitor in light of his depressed EF and probable embolic strokes.  #2 hypertension Permissive hypertension secondary to problem #1. Continue lisinopril and metoprolol.  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 CBGs have ranged from 241-285. Patient on half home dose Levemir as he was nothing by mouth on admission with sliding scale insulin. Will place patient back on home regimen of Levemir and continue sliding scale insulin.  #4 hyperlipidemia LDL was 100 on 10/05/2014. Goal LDL is less than 70. Continue Lipitor.  #5 chronic systolic heart failure Stable.  #6 coronary artery disease status post CABG Stable. Continue aspirin and Plavix. Continue beta blocker and ACE inhibitor. Follow.  #7 peripheral vascular disease Continue aspirin, Plavix, statin.  #8 prophylaxis PPI for GI prophylaxis. Lovenox for DVT prophylaxis.   Code Status: Full Family Communication:  Updated patient. No family at bedside. Disposition Plan: Pending stroke workup.   Consultants:  Neurology: Dr. Armida Sans 10/10/2014  Cardiology  EP: Dr. Caryl Comes 10/11/2014  Procedures:  TEE Dr. Meda Coffee 10/11/2014  CT angiogram head and neck 10/11/2014  MRI 10/10/2014  Antibiotics:  None  HPI/Subjective: Patient states after discharge from last hospitalization was unable to get his Plavix. Patient still with some complaints of left-sided numbness.  Objective: Filed Vitals:   10/11/14 0952  BP: 159/110  Pulse:   Temp:   Resp: 25    Intake/Output Summary (Last 24 hours) at 10/11/14 1034 Last data filed at 10/11/14 0817  Gross per 24 hour  Intake      0 ml  Output      0 ml  Net      0 ml   Filed Weights   10/10/14 2103  Weight: 119.16 kg (262 lb 11.2 oz)    Exam:   General:  NAD  Cardiovascular: RRR  Respiratory: CTAB  Abdomen: Soft, nontender, nondistended, positive bowel sounds.  Musculoskeletal: No clubbing cyanosis or edema.   Data Reviewed: Basic Metabolic Panel:  Recent Labs Lab 10/04/14 1100 10/04/14 1204 10/05/14 0540 10/06/14 0515 10/10/14 1625 10/10/14 1653  NA 132* 137  --  134* 136 139  K 5.1 4.6  --  3.6 4.3 4.3  CL 101 102  --  101 102 100*  CO2 23  --   --  25 27  --   GLUCOSE 393* 403*  --  163* 329* 332*  BUN 29* 37*  --  28* 18 22*  CREATININE 0.92 0.90  --  1.15 0.95 0.90  CALCIUM 8.5*  --   --  8.6* 9.4  --  MG  --   --  1.9  --   --   --    Liver Function Tests:  Recent Labs Lab 10/04/14 1100 10/06/14 0515 10/10/14 1625  AST 34 15 18  ALT 18 19 22   ALKPHOS 80 59 69  BILITOT 1.2 0.6 0.6  PROT 7.8 7.6 7.7  ALBUMIN 3.3* 3.1* 3.3*   No results for input(s): LIPASE, AMYLASE in the last 168 hours. No results for input(s): AMMONIA in the last 168 hours. CBC:  Recent Labs Lab 10/04/14 1100 10/04/14 1204 10/06/14 0515 10/10/14 1625 10/10/14 1653  WBC 6.9  --  6.4 6.9  --   NEUTROABS 4.0  --   --  3.7  --    HGB 15.2 16.3 13.9 14.7 16.0  HCT 43.5 48.0 41.7 42.0 47.0  MCV 88.4  --  89.1 87.7  --   PLT 282  --  246 270  --    Cardiac Enzymes:  Recent Labs Lab 10/04/14 2057  TROPONINI 0.03   BNP (last 3 results) No results for input(s): BNP in the last 8760 hours.  ProBNP (last 3 results) No results for input(s): PROBNP in the last 8760 hours.  CBG:  Recent Labs Lab 10/10/14 1645 10/10/14 2222 10/10/14 2353 10/11/14 0304 10/11/14 0738  GLUCAP 287* 256* 239* 241* 285*    No results found for this or any previous visit (from the past 240 hour(s)).   Studies: Mr Brain Wo Contrast  10/10/2014   CLINICAL DATA:  Left-sided arm numbness, onset today. Recent stroke.  EXAM: MRI HEAD WITHOUT CONTRAST  TECHNIQUE: Multiplanar, multiecho pulse sequences of the brain and surrounding structures were obtained without intravenous contrast.  COMPARISON:  10/04/2014  FINDINGS: Calvarium and upper cervical spine: No focal marrow signal abnormality.  Orbits: No significant findings.  Sinuses and Mastoids: Moderate scattered inflammatory mucosal thickening in the paranasal sinuses. Mastoid and middle ears are clear.  Brain: In addition to the acute infarcts seen 10/04/2014, there are multiple additional acute infarcts in the same general distribution, along the high right frontal parietal convexity, right corona radiata, and in the cortical and subcortical right occipital lobe. The Pattern is best ascribed to MCA and MCA watershed. No infratentorial or contralateral infarcts. Minimal petechial hemorrhage in the high right frontal parietal cortex is stable from previous. Noted negative carotid Doppler, CTA may be useful to evaluate for ulcerated plaque or intracranial stenosis.  Moderate white matter disease, mainly periventricular and deep, likely chronic small vessel disease. Remote lacunar infarct noted in the right periatrial white matter.  No evidence of major vessel occlusion, hydrocephalus, or mass  lesion.  IMPRESSION: Since 10/04/2014, increased number of acute infarcts in the right cerebral hemisphere, MCA and border zone arterial distribution. Petechial hemorrhage is minimal and stable from 10/04/2014.   Electronically Signed   By: Monte Fantasia M.D.   On: 10/10/2014 18:09    Scheduled Meds: . aspirin EC  81 mg Oral Daily  . aspirin  300 mg Rectal Once  . atorvastatin  40 mg Oral Daily  . clopidogrel  75 mg Oral Daily  . enoxaparin (LOVENOX) injection  40 mg Subcutaneous Q24H  . gabapentin  600 mg Oral TID  . insulin aspart  0-15 Units Subcutaneous 6 times per day  . insulin detemir  16 Units Subcutaneous QHS  . lisinopril  20 mg Oral Daily  . metoprolol tartrate  12.5 mg Oral Daily  . pantoprazole (PROTONIX) IV  40 mg Intravenous QHS  . sertraline  50 mg Oral Daily   Continuous Infusions:   Principal Problem:   Acute embolic stroke Active Problems:   DM type 2 (diabetes mellitus, type 2)   HTN (hypertension)   Hyperlipidemia   Coronary artery disease   Peripheral arterial disease   Type 2 diabetes mellitus with peripheral neuropathy   Stroke   Systolic CHF, chronic    Time spent: 49 minutes    THOMPSON,DANIEL M.D. [facility] Pager [phone]. If 7PM-7AM, please contact night-coverage at [email or web address], password [facility] 10/11/2014, 10:34 AM  LOS: 1 day

## 2014-10-11 NOTE — Progress Notes (Signed)
TEE will rescheduled for 1330 PM today per [facility] in ENDO.   Ave Filter, RN

## 2014-10-11 NOTE — Consult Note (Addendum)
ELECTROPHYSIOLOGY CONSULT NOTE  Patient ID: Thomas Sosa MRN: [id number], DOB/AGE: January 16, 1956   Admit date: 10/10/2014 Date of Consult: 10/11/2014  Primary Physician: [name], NP Primary Cardiologist: Dr. Adora Fridge  Reason for Consultation: Cryptogenic stroke; recommendations regarding Implantable Loop Recorder  History of Present Illness:   Thomas Sosa was admitted on 10/10/2014 with Lt sided numbness and weakness that started the day before.  Similar presentation on 10/04/14 with right peri rolandi cortex and subcortical white matter stroke. (Pt tells me had had similar episode that resolved prior to the 10/04/14 admit)  Imaging demonstrated MRI on 10/10/14 -Since 10/04/2014, increased number of acute infarcts in the right cerebral hemisphere, MCA and border zone arterial distribution.  Petechial hemorrhage is minimal and stable from 10/04/2014. He was discharged on Plavix but has not yet had filled.    He has undergone workup for stroke including echocardiogram and carotid dopplers (1-39% ICA stenosis).  The patient has been monitored on telemetry which has demonstrated sinus rhythm with no arrhythmias this admit except for PACs and PVCs.  On last admit pt had 25 beats of NSVT at rate of about 150 BPM.    Inpatient stroke work-up is to be completed with a TEE.   Echocardiogram last admit on 10/06/14 demonstrated EF 25-35% with L atrium and R atrium mildly dilated and no cardiac source of emboli.  Lab work is remarkable for elevated Glucose, normal lytes and normal CBC.   Troponin 0.02.  Prior to admission, the patient denies chest pain, shortness of breath, dizziness, palpitations, or syncope.  They are recovering from their stroke with plans to go home at discharge.  EP has been asked to evaluate for placement of an implantable loop recorder to monitor for atrial fibrillation.  ROS is negative except as outlined above.  And some DOE not severe.  Never been told he has atrial fib.  Never  been told his EF was low.    ABIs in Jan. revealed typical tibial vessel disease in a diabetic with a right ABI of 0.78 and left 0.65. He did have what appeared to be a Baker's cyst in his right leg. He has no evidence of critical limb ischemia and denies claudication. Conservative therapy is recommended.  Past Medical History  Diagnosis Date  . Hypertension   . Diabetes mellitus without complication   . Diabetic neuropathy Dx 2011  . Cancer of prostate Dx 2005  . Hyperlipidemia   . Coronary artery disease   . Tobacco abuse   . Peripheral arterial disease   . Stroke      Surgical History:  Past Surgical History  Procedure Laterality Date  . Replacement total knee  Lt 2013/ Rt 2005     Prescriptions prior to admission  Medication Sig Dispense Refill Last Dose  . atorvastatin (LIPITOR) 40 MG tablet Take 1 tablet (40 mg total) by mouth daily. 30 tablet 6 Past Week at Unknown time  . gabapentin (NEURONTIN) 600 MG tablet take 1 and 1/2 tablets by mouth three times a day 135 tablet 1 10/10/2014 at Unknown time  . glipiZIDE (GLUCOTROL XL) 5 MG 24 hr tablet Take 1 tablet (5 mg total) by mouth daily with breakfast. 30 tablet 2 10/10/2014 at Unknown time  . ibuprofen (ADVIL,MOTRIN) 200 MG tablet Take 400 mg by mouth daily as needed for headache.   Past Week at Unknown time  . insulin aspart (NOVOLOG) 100 UNIT/ML injection Inject 14 Units into the skin 3 (three) times daily before  meals. < 400 = 14 units; > 400 = call MD   10/09/2014 at Unknown time  . insulin detemir (LEVEMIR) 100 UNIT/ML injection Inject 0.32 mLs (32 Units total) into the skin at bedtime. (Patient taking differently: Inject 35 Units into the skin 2 (two) times daily. ) 10 mL 11 10/10/2014 at Unknown time  . lisinopril (PRINIVIL,ZESTRIL) 20 MG tablet Take 1 tablet (20 mg total) by mouth daily. Blood pressure 30 tablet 4 10/08/2014 at Unknown time  . metoprolol tartrate (LOPRESSOR) 25 MG tablet Take 12.5 mg by mouth daily.    Past  Week at ??  . omeprazole (PRILOSEC) 20 MG capsule Take 1 capsule (20 mg total) by mouth daily. Acid reflux 30 capsule 4 10/10/2014 at Unknown time  . sertraline (ZOLOFT) 50 MG tablet Take 1 tablet (50 mg total) by mouth daily. For depression 30 tablet 3 10/09/2014 at Unknown time  . traMADol (ULTRAM) 50 MG tablet Take 100 mg by mouth every 6 (six) hours as needed for moderate pain.    Past Week at Unknown time  . clopidogrel (PLAVIX) 75 MG tablet Take 1 tablet (75 mg total) by mouth daily. 30 tablet 2 Never  . ONE TOUCH ULTRA TEST test strip TEST four times a day 100 each 6     Inpatient Medications:  . aspirin EC  81 mg Oral Daily  . aspirin  300 mg Rectal Once  . atorvastatin  40 mg Oral Daily  . clopidogrel  75 mg Oral Daily  . enoxaparin (LOVENOX) injection  40 mg Subcutaneous Q24H  . gabapentin  600 mg Oral TID  . insulin aspart  0-15 Units Subcutaneous 6 times per day  . insulin detemir  16 Units Subcutaneous QHS  . lisinopril  20 mg Oral Daily  . metoprolol tartrate  12.5 mg Oral Daily  . pantoprazole (PROTONIX) IV  40 mg Intravenous QHS  . sertraline  50 mg Oral Daily    Allergies: No Known Allergies  History   Social History  . Marital Status: Widowed    Spouse Name: N/A  . Number of Children: N/A  . Years of Education: N/A   Occupational History  . Not on file.   Social History Main Topics  . Smoking status: Former Smoker    Quit date: 03/26/2014  . Smokeless tobacco: Not on file  . Alcohol Use: No  . Drug Use: Not on file  . Sexual Activity: Not on file   Other Topics Concern  . Not on file   Social History Narrative     Family History  Problem Relation Age of Onset  . Heart Problems Mother   . Heart Problems Father      Physical Exam: Filed Vitals:   10/11/14 0500 10/11/14 0653 10/11/14 0824 10/11/14 0952  BP: 131/82 141/70 157/87 159/110  Pulse: 74  73   Temp: 97.7 F (36.5 C)  97.6 F (36.4 C)   TempSrc: Oral  Oral   Resp: 18 17 16 25     Height:      Weight:      SpO2: 100% 100% 98% 96%    GEN- The patient is well appearing, alert and oriented x 3 today.   Head- normocephalic, atraumatic Eyes-  Sclera clear, conjunctiva pink Ears- hearing intact Oropharynx- clear Neck- supple, Lungs- Clear to ausculation bilaterally, normal work of breathing Heart- Regular rate and rhythm, soft systolic murmur, no rubs or gallops, PMI not laterally displaced GI- soft, NT, ND, + BS Extremities- no  clubbing, cyanosis, or edema MS- no significant deformity or atrophy Skin- no rash or lesion Psych- euthymic mood, full affect   Labs:   Lab Results  Component Value Date   WBC 6.9 10/10/2014   HGB 16.0 10/10/2014   HCT 47.0 10/10/2014   MCV 87.7 10/10/2014   PLT 270 10/10/2014    Recent Labs Lab 10/10/14 1625 10/10/14 1653  NA 136 139  K 4.3 4.3  CL 102 100*  CO2 27  --   BUN 18 22*  CREATININE 0.95 0.90  CALCIUM 9.4  --   PROT 7.7  --   BILITOT 0.6  --   ALKPHOS 69  --   ALT 22  --   AST 18  --   GLUCOSE 329* 332*   Lab Results  Component Value Date   TROPONINI 0.03 10/04/2014   Lab Results  Component Value Date   CHOL 179 10/05/2014   CHOL 194 10/04/2014   CHOL 160 04/09/2014   Lab Results  Component Value Date   HDL 28* 10/05/2014   HDL 34* 10/04/2014   HDL 34* 04/09/2014   Lab Results  Component Value Date   LDLCALC 100* 10/05/2014   LDLCALC 124* 10/04/2014   LDLCALC 106* 04/09/2014   Lab Results  Component Value Date   TRIG 253* 10/05/2014   TRIG 182* 10/04/2014   TRIG 100 04/09/2014   Lab Results  Component Value Date   CHOLHDL 6.4 10/05/2014   CHOLHDL 5.7 10/04/2014   CHOLHDL 4.7 04/09/2014   No results found for: LDLDIRECT  No results found for: DDIMER   Radiology/Studies: Dg Chest 2 View  10/04/2014   CLINICAL DATA:  Shortness of breath and cough  EXAM: CHEST - 2 VIEW  COMPARISON:  None.  FINDINGS: Postsurgical changes are noted. The cardiac shadow is within normal limits. The  lungs are clear bilaterally. No acute bony abnormality is noted.  IMPRESSION: No active disease.   Electronically Signed   By: Inez Catalina M.D.   On: 10/04/2014 13:03   Mr Brain Wo Contrast  10/10/2014   CLINICAL DATA:  Left-sided arm numbness, onset today. Recent stroke.  EXAM: MRI HEAD WITHOUT CONTRAST  TECHNIQUE: Multiplanar, multiecho pulse sequences of the brain and surrounding structures were obtained without intravenous contrast.  COMPARISON:  10/04/2014  FINDINGS: Calvarium and upper cervical spine: No focal marrow signal abnormality.  Orbits: No significant findings.  Sinuses and Mastoids: Moderate scattered inflammatory mucosal thickening in the paranasal sinuses. Mastoid and middle ears are clear.  Brain: In addition to the acute infarcts seen 10/04/2014, there are multiple additional acute infarcts in the same general distribution, along the high right frontal parietal convexity, right corona radiata, and in the cortical and subcortical right occipital lobe. The Pattern is best ascribed to MCA and MCA watershed. No infratentorial or contralateral infarcts. Minimal petechial hemorrhage in the high right frontal parietal cortex is stable from previous. Noted negative carotid Doppler, CTA may be useful to evaluate for ulcerated plaque or intracranial stenosis.  Moderate white matter disease, mainly periventricular and deep, likely chronic small vessel disease. Remote lacunar infarct noted in the right periatrial white matter.  No evidence of major vessel occlusion, hydrocephalus, or mass lesion.  IMPRESSION: Since 10/04/2014, increased number of acute infarcts in the right cerebral hemisphere, MCA and border zone arterial distribution. Petechial hemorrhage is minimal and stable from 10/04/2014.   Electronically Signed   By: Monte Fantasia M.D.   On: 10/10/2014 18:09   Mr Brain Lottie Dawson  Contrast  10/04/2014   CLINICAL DATA:  59 year old male with left side numbness and weakness for 2 days. Unresolved  symptoms. Initial encounter.  EXAM: MRI HEAD WITHOUT CONTRAST  TECHNIQUE: Multiplanar, multiecho pulse sequences of the brain and surrounding structures were obtained without intravenous contrast.  COMPARISON:  None.  FINDINGS: Clustered small foci of abnormal trace diffusion in the right peri-Rolandic cortex and subcortical white matter near the right motor strip (series 9, image 13). The most confluent foci do appear mildly restricted on ADC (series 300, image 38). Mild T2 and FLAIR hyperintensity associated. There is evidence of associated petechial hemorrhage (series 7, image 86), but no mass effect.  There are also several small foci of similar abnormal trace diffusion in the right occipital pole involving both cortex and white matter (series 3 images 19-22).  No posterior fossa or contralateral left hemisphere diffusion abnormality.  Major intracranial vascular flow voids are preserved, there is a mild degree of intracranial artery dolichoectasia.  Patchy bilateral cerebral white matter T2 and FLAIR hyper intensity, with areas which most resemble small chronic white matter lacunar infarcts (series 6, image 17). No definite cortical encephalomalacia. Occasional chronic micro hemorrhages in the brain suggested on susceptibility weighted imaging. Deep gray matter nuclei are within normal limits. Mild nonspecific patchy T2 hyperintensity in the pons. Small chronic right cerebellar lacunar infarct on series 5, image 6.  No midline shift, mass effect, evidence of mass lesion, ventriculomegaly, extra-axial collection or acute intracranial hemorrhage. Cervicomedullary junction and pituitary are within normal limits. Visible internal auditory structures appear normal. Mastoids are clear. Moderate paranasal sinus mucosal thickening. Visualized orbit soft tissues are within normal limits. Scalp soft tissues are within normal limits. Visualized bone marrow signal is within normal limits. Negative for age visualized  cervical spine.  IMPRESSION: 1. Small subacute appearing infarcts in the right peri-Rolandic cortex and subcortical white matter. Similar small infarcts in the right occipital pole. Minimal petechial hemorrhage with no mass effect. 2. Evidence of underlying chronic small vessel ischemia in both cerebral hemispheres, and the right cerebellum.   Electronically Signed   By: Genevie Ann M.D.   On: 10/04/2014 18:18   Ct Angio Chest Aorta W/cm &/or Wo/cm  10/04/2014   CLINICAL DATA:  59 year old male with left-sided chest pain and tingling  EXAM: CT ANGIOGRAPHY CHEST WITH CONTRAST  TECHNIQUE: Multidetector CT imaging of the chest was performed using the standard protocol during bolus administration of intravenous contrast. Multiplanar CT image reconstructions and MIPs were obtained to evaluate the vascular anatomy.  CONTRAST:  140mL OMNIPAQUE IOHEXOL 350 MG/ML SOLN  COMPARISON:  Chest x-ray obtained earlier today  FINDINGS: Mediastinum: Unremarkable CT appearance of the thyroid gland. No suspicious mediastinal or hilar adenopathy. No soft tissue mediastinal mass. The thoracic esophagus is unremarkable.  Heart/Vascular: On the initial unenhanced images, there is no evidence of crescentic high attenuation in the vascular media to suggest acute intramural hematoma. Following a opacification with intravenous contrast, there is no evidence of dissection or aneurysm. Conventional 3 vessel arch anatomy. Scant atherosclerotic vascular calcifications without evidence of stenosis. Normal caliber main and central pulmonary arteries without evidence of central PE. There is cardiomegaly with left ventricular dilatation. Extensive calcifications are present throughout the coronary arteries. Post surgical changes suggest prior 2 vessel CABG. Aortic to distal RCA graft opacifies with contrast material as does the aorta to mid to distal LAD.  Lungs/Pleura: Trace dependent atelectasis. Otherwise, the lungs are clear.  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 No acute fracture or aggressive appearing lytic or blastic osseous lesion. Multilevel degenerative disc disease.  Upper Abdomen: Visualized upper abdominal organs are unremarkable.  Review of the MIP images confirms the above findings.  IMPRESSION: 1. No evidence of acute aortic dissection, aneurysm or other acute vascular abnormality. 2. Cardiomegaly with left ventricular dilatation. 3. Advanced calcification of the coronary arteries. Post surgical changes of prior multivessel CABG. Although evaluation is limited by non cardiac gated technique, the proximal aspect of the bypass grafts opacify with contrast material suggesting patency.   Electronically Signed   By: Jacqulynn Cadet M.D.   On: 10/04/2014 15:04    12-lead ECG Normal sinus rhythm Biatrial enlargement Incomplete left bundle branch block T wave abnormality, consider lateral ischemia Prolonged QT Abnormal ECG All prior EKG's in EPIC reviewed with no documented atrial fibrillation  Telemetry SR with PACs and PVCs, last admit with NSVT.   Assessment and Plan:  1. 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 Hypertension  4.  Ventricular tachy non sustained  5.  Obstructive sleep apnea The patient presents with cryptogenic stroke.  The patient has a TEE planned for this pm.  I spoke at length with the patient about monitoring for afib with either a 30 day event monitor or an implantable loop recorder.  Risks, benefits, and alteratives to implantable loop recorder were discussed with the patient today.   At this time, the patient is very clear in their decision to proceed with implantable loop recorder.  Hx of arrhthymias of heart stopping in past that led to CABG. Last admit with NSVT 25 beats.  Discussed with Dr. Caryl Comes, if ol EF 25-30% then may need ICD and not Loop recorder.   Attempting to receive records.     We will attempt to get records from [facility] in [place]. To see if new drop in EF is new.   Please call with questions.   Cecilie Kicks, FNP-C [facility]  MCN:[phone] or after 5pm and on weekends call [phone] 10/11/2014.now      The pt has iCM with depressed LV with VT-NS;  If LVEF is chrnically depressed, then ICD would be indicated and would not need LOOP for AFib as would use dual chamber device  If EF is newly depressed will need cath to assess reversible ischemic gheart disease  Would change metop>> carvedilol given mortality benefit COMET TRIAL   mzzximize afterload reduction as tolerated in the wake of his stroke  Will follow  Hopefully we ccan get clarified in next 48 hrs  Will need outpt sleep study

## 2014-10-11 NOTE — Evaluation (Signed)
Clinical/Bedside Swallow Evaluation Patient Details  Name: Thomas Sosa MRN: [id number] Date of Birth: 08-02-1955  Today's Date: 10/11/2014 Time: SLP Start Time (ACUTE ONLY): 8676 SLP Stop Time (ACUTE ONLY): 0936 SLP Time Calculation (min) (ACUTE ONLY): 15 min  Past Medical History:  Past Medical History  Diagnosis Date  . Hypertension   . Diabetes mellitus without complication   . Diabetic neuropathy Dx 2011  . Cancer of prostate Dx 2005  . Hyperlipidemia   . Coronary artery disease   . Tobacco abuse   . Peripheral arterial disease   . Stroke    Past Surgical History:  Past Surgical History  Procedure Laterality Date  . Replacement total knee  Lt 2013/ Rt 2005   HPI:  Thomas Sosa is a 59 y.o. 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 Patient passed RN stroke swallow screen on previous admission but failed this admission due to coughing with water.    Assessment / Plan / Recommendation Clinical Impression  Patient present with a functional oropharyngeal swallow without overt indication of aspiration. In light of nursing report of coughing with pos at bedside and admission for new CVA symptoms will f/u briefly for diet tolerance. May initiate a regular diet.     Aspiration Risk  Mild    Diet Recommendation Age appropriate regular solids;Thin   Medication Administration: Whole meds with liquid Compensations: Slow rate;Small sips/bites    Other  Recommendations Oral Care Recommendations: Oral care BID      Frequency and Duration min 1 x/week  1 week   Pertinent Vitals/Pain n/a      Swallow Study Prior Functional Status  Type of Home: Apartment Available Help at Discharge: Family;Friend(s);Available 24 hours/day    General  Other Pertinent Information: Thomas Sosa is a 59 y.o. 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 Patient passed RN stroke swallow screen on previous admission but failed this admission due to coughing with water.  Type of Study: Bedside swallow evaluation Previous Swallow Assessment: n/a Diet Prior to this Study: NPO Temperature Spikes Noted: No Respiratory Status: Room air History of Recent Intubation: No Behavior/Cognition: Alert;Cooperative;Pleasant mood Oral Cavity - Dentition: Poor condition;Missing dentition Self-Feeding Abilities: Able to feed self Patient Positioning: Upright in chair/Tumbleform Baseline Vocal Quality: Normal Volitional Cough: Strong Volitional Swallow: Able to elicit    Oral/Motor/Sensory Function Overall Oral Motor/Sensory Function: Appears within functional limits for tasks assessed   Ice Chips Ice chips: Not tested   Thin Liquid Thin Liquid: Within functional limits Presentation: Cup;Self Fed;Straw    Nectar Thick Nectar Thick Liquid: Not tested   Honey Thick Honey Thick Liquid: Not tested   Puree Puree: Within functional limits Presentation: Self Fed;Spoon   Solid   GO   Thomas Buckles MA, CCC-SLP [phone]  Solid: Within functional limits Presentation: Self Fed       Thomas Sosa 10/11/2014,11:30 AM

## 2014-10-11 NOTE — Evaluation (Signed)
Physical Therapy Evaluation Patient Details Name: Thomas Sosa MRN: [id number] DOB: [birth year] Today's Date: 10/11/2014   History of Present Illness  Patient is a 59 y/o male with hx of HTN. 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 MRI-increased number of acute infarcts in the right cerebral hemisphere, MCA and border zone arterial distribution.  Clinical Impression  Patient presents with L hemiparesis impacting mobility and balance. Will need to assess higher level balance activities prior to discharge and ensure pt can perform stair negotiation to get into second level apt. Pt reports 62 y/o daughter lives at home to assist as needed. Pt active and goes to gym daily. Will continue to follow to maximize independence and mobility prior to return home.     Follow Up Recommendations Outpatient PT    Equipment Recommendations  None recommended by PT    Recommendations for Other Services       Precautions / Restrictions Precautions Precautions: Fall Restrictions Weight Bearing Restrictions: No      Mobility  Bed Mobility               General bed mobility comments: Pt in chair upon arrival  Transfers Overall transfer level: Needs assistance Equipment used: None Transfers: Sit to/from Stand Sit to Stand: Supervision         General transfer comment: Supervision for safety.   Ambulation/Gait Ambulation/Gait assistance: Supervision Ambulation Distance (Feet): 150 Feet Assistive device: None Gait Pattern/deviations: Step-through pattern;Decreased stride length;Drifts right/left Gait velocity: decreased Gait velocity interpretation: Below normal speed for age/gender General Gait Details: Slow, mildly unsteady gait but no overt LOB. No knee buckling.  Stairs            Wheelchair Mobility    Modified  Rankin (Stroke Patients Only) Modified Rankin (Stroke Patients Only) Pre-Morbid Rankin Score: No symptoms Modified Rankin: Moderately severe disability     Balance Overall balance assessment: Needs assistance Sitting-balance support: Feet supported;No upper extremity supported Sitting balance-Leahy Scale: Good     Standing balance support: During functional activity Standing balance-Leahy Scale: Fair                               Pertinent Vitals/Pain Pain Assessment: No/denies pain    Home Living Family/patient expects to be discharged to:: Private residence Living Arrangements: Children (with 61 y/o daughter) Available Help at Discharge: Family;Friend(s);Available 24 hours/day Type of Home: Apartment Home Access: Stairs to enter Entrance Stairs-Rails: Can reach both Entrance Stairs-Number of Steps: 12 Home Layout: One level        Prior Function Level of Independence: Independent               Hand Dominance   Dominant Hand: Right    Extremity/Trunk Assessment   Upper Extremity Assessment: LUE deficits/detail       LUE Deficits / Details: Limited AROM shoulder elevation. Weakness throughout shoulder, elbow, wrist, grip.   Lower Extremity Assessment: LLE deficits/detail;RLE deficits/detail   LLE Deficits / Details: Grossly ~3/5 throughout hip, knee ankle.  Cervical / Trunk Assessment: Normal  Communication   Communication: Other (comment) (MIld dysarthria.)  Cognition Arousal/Alertness: Awake/alert Behavior During Therapy: WFL for tasks assessed/performed Overall Cognitive Status: Within Functional Limits for tasks assessed  General Comments      Exercises        Assessment/Plan    PT Assessment Patient needs continued PT services  PT Diagnosis Abnormality of gait   PT Problem List Decreased strength;Decreased mobility;Decreased range of motion;Decreased balance;Impaired sensation  PT Treatment  Interventions Therapeutic activities;Gait training;Stair training;Functional mobility training;Neuromuscular re-education;Balance training;Therapeutic exercise;Patient/family education   PT Goals (Current goals can be found in the Care Plan section) Acute Rehab PT Goals Patient Stated Goal: to go home PT Goal Formulation: With patient Time For Goal Achievement: 10/25/14 Potential to Achieve Goals: Good    Frequency Min 3X/week   Barriers to discharge Decreased caregiver support;Inaccessible home environment 12 steps to enter apt.    Co-evaluation               End of Session Equipment Utilized During Treatment: Gait belt Activity Tolerance: Patient tolerated treatment well Patient left: in chair;with call bell/phone within reach;with chair alarm set Nurse Communication: Mobility status         Time: 1025-1040 PT Time Calculation (min) (ACUTE ONLY): 15 min   Charges:   PT Evaluation $Initial PT Evaluation Tier I: 1 Procedure     PT G Codes:        Onyekachi Gathright A Maruice Pieroni 10/11/2014, 10:53 AM Wray Kearns, PT, DPT [phone]

## 2014-10-11 NOTE — CV Procedure (Signed)
   Transesophageal Echocardiogram Note  Braddock Servellon [id number] Feb 25, 1956  Procedure: Transesophageal Echocardiogram Indications: Stroke  Procedure Details Consent: Obtained Time Out: Verified patient identification, verified procedure, site/side was marked, verified correct patient position, special equipment/implants available, Radiology Safety Procedures followed,  medications/allergies/relevent history reviewed, required imaging and test results available.  Performed  Medications: Fentanyl: 50 mcg Versed: 4 mg  Study Conclusions  - Left ventricle: The cavity size was moderately dilated. Systolic function was severely reduced. The estimated ejection fraction was in the range of 25% to 30%. Diffuse hypokinesis. - No cardiac source of emboli was indentified. Negative bubble study. - Aorta: Mild atherosclerotic plaque.   Complications: No apparent complications Patient did tolerate procedure well.  Dorothy Spark, MD, [facility] 10/11/2014, 9:50 AM

## 2014-10-11 NOTE — Progress Notes (Signed)
STROKE TEAM PROGRESS NOTE   HISTORY Eliyahu Bille is an 59 y.o. male with a past medical history that is relevant for HTN, DM type 2, hyperlipidemia, CAD s/p CABG, tobacco abuse, right peri-Rolandic cortex, right occipital pole and subcortical infarcts on 10/04/14, PAD, diabetic neuropathy, returns to the ED with complains of left sided numbness-tingling, heaviness/weakness left hand and leg, and slurred speech. He indicated that he had " a lot of left sided numbness at the time of my recent stroke, but they went completely away and came back again yesterday morning". Patient tells me that he went to the gym and while exercising in the treadmill started experiencing numbness in the left side of his trunk, left leg, and left Kenan. Subsequently noticed difficulty using his left hand and leg as well as slurred speech. Said that he has been having HA lately, but denies associated vertigo, double vision, difficulty swallowing, confusion, or vision impairment. Takes aspirin daily. MRI brain performed revealed increased number of acute infarcts in the right cerebral hemisphere, MCA and border zone arterial distribution. Mr. Low stated that he was diagnosed with " irregularly disorganized heart rhythm after my heart surgery last year" but said that he never being on anticoagulation. He was last known well: 10/08/14, time unknown.  Patient was not administered TPA secondary to out of the window. He was admitted for further evaluation and treatment.   SUBJECTIVE (INTERVAL HISTORY) No family is at the bedside.  Overall he feels his condition is stable. He is NPO for TEE scheduled today at 2p. EP NP was leaving as we arrived - cardiology will review case prior to proceeding with loop.    OBJECTIVE Temp:  [97.6 F (36.4 C)-98.7 F (37.1 C)] 97.6 F (36.4 C) (07/18 0824) Pulse Rate:  [73-88] 73 (07/18 0824) Cardiac Rhythm:  [-] Normal sinus rhythm (07/17 2218) Resp:  [14-25] 25 (07/18 0952) BP:  (131-185)/(59-110) 159/110 mmHg (07/18 0952) SpO2:  [94 %-100 %] 96 % (07/18 0952) Weight:  [119.16 kg (262 lb 11.2 oz)] 119.16 kg (262 lb 11.2 oz) (07/17 2103)   Recent Labs Lab 10/10/14 1645 10/10/14 2222 10/10/14 2353 10/11/14 0304 10/11/14 0738  GLUCAP 287* 256* 239* 241* 285*    Recent Labs Lab 10/04/14 1204 10/05/14 0540 10/06/14 0515 10/10/14 1625 10/10/14 1653  NA 137  --  134* 136 139  K 4.6  --  3.6 4.3 4.3  CL 102  --  101 102 100*  CO2  --   --  25 27  --   GLUCOSE 403*  --  163* 329* 332*  BUN 37*  --  28* 18 22*  CREATININE 0.90  --  1.15 0.95 0.90  CALCIUM  --   --  8.6* 9.4  --   MG  --  1.9  --   --   --     Recent Labs Lab 10/06/14 0515 10/10/14 1625  AST 15 18  ALT 19 22  ALKPHOS 59 69  BILITOT 0.6 0.6  PROT 7.6 7.7  ALBUMIN 3.1* 3.3*    Recent Labs Lab 10/04/14 1204 10/06/14 0515 10/10/14 1625 10/10/14 1653  WBC  --  6.4 6.9  --   NEUTROABS  --   --  3.7  --   HGB 16.3 13.9 14.7 16.0  HCT 48.0 41.7 42.0 47.0  MCV  --  89.1 87.7  --   PLT  --  246 270  --     Recent Labs Lab 10/04/14 2057  TROPONINI  0.03    Recent Labs  10/10/14 1625  LABPROT 14.7  INR 1.13   No results for input(s): COLORURINE, LABSPEC, PHURINE, GLUCOSEU, HGBUR, BILIRUBINUR, KETONESUR, PROTEINUR, UROBILINOGEN, NITRITE, LEUKOCYTESUR in the last 72 hours.  Invalid input(s): APPERANCEUR     Component Value Date/Time   CHOL 179 10/05/2014 0540   TRIG 253* 10/05/2014 0540   HDL 28* 10/05/2014 0540   CHOLHDL 6.4 10/05/2014 0540   VLDL 51* 10/05/2014 0540   LDLCALC 100* 10/05/2014 0540   Lab Results  Component Value Date   HGBA1C 12.1* 10/05/2014   No results found for: LABOPIA, COCAINSCRNUR, LABBENZ, AMPHETMU, THCU, LABBARB  No results for input(s): ETH in the last 168 hours.   Mr Brain Wo Contrast  10/04/2014    IMPRESSION: 1. Small subacute appearing infarcts in the right peri-Rolandic cortex and subcortical white matter. Similar small  infarcts in the right occipital pole. Minimal petechial hemorrhage with no mass effect. 2. Evidence of underlying chronic small vessel ischemia in both cerebral hemispheres, and the right cerebellum.   Ct Angio Chest Aorta W/cm &/or Wo/cm 10/04/2014  IMPRESSION: 1. No evidence of acute aortic dissection, aneurysm or other acute vascular abnormality. 2. Cardiomegaly with left ventricular dilatation. 3. Advanced calcification of the coronary arteries. Post surgical changes of prior multivessel CABG. Although evaluation is limited by non cardiac gated technique, the proximal aspect of the bypass grafts opacify with contrast material suggesting patency.   Electronically Signed   By: Jacqulynn Cadet M.D.   On: 10/04/2014 15:04   Mr Brain Wo Contrast 10/10/2014   Since 10/04/2014, increased number of acute infarcts in the right cerebral hemisphere, MCA and border zone arterial distribution. Petechial hemorrhage is minimal and stable from 10/04/2014.     Ct Angio Head & Neck W/cm &/or Wo Cm 10/11/2014   Atherosclerotic disease at both carotid bifurcations but no stenosis greater than 20% on either side.  Severe intracranial atherosclerotic disease affecting the carotid siphon regions. Serial 50-70% stenoses in the carotid siphon regions could be hemodynamically significant. No focal proximal anterior or middle cerebral artery finding.  50% stenosis of the right vertebral artery origin. No stenosis of the dominant left vertebral artery. Advanced atherosclerotic disease of both vertebral arteries in the intracranial portion with serial stenoses of 50-70%. 50% stenosis of the proximal basilar artery.     CUS - Bilateral: 1-39% ICA stenosis. Vertebral artery flow is antegrade.  TTE - Left ventricle: The cavity size was moderately dilated. Systolic function was severely reduced. The estimated ejection fraction was in the range of 25% to 30%. Diffuse hypokinesis. - Left atrium: The atrium was mildly  dilated. - Right atrium: The atrium was mildly dilated. Impressions: - No cardiac source of emboli was indentified.  TEE - pending  PHYSICAL EXAM  Temp:  [97.6 F (36.4 C)-98.8 F (37.1 C)] 98.8 F (37.1 C) (07/18 2048) Pulse Rate:  [61-88] 80 (07/18 2048) Resp:  [10-25] 18 (07/18 2048) BP: (126-185)/(64-134) 145/88 mmHg (07/18 2048) SpO2:  [95 %-100 %] 99 % (07/18 2048)  General - Well nourished, well developed, in no apparent distress.  Ophthalmologic - Fundi not visualized due to small pupils.  Cardiovascular - Regular rate and rhythm with no murmur.  Mental Status -  Level of arousal and orientation to time, place, and person were intact. Language including expression, naming, repetition, comprehension was assessed and found intact. Fund of Knowledge was assessed and was intact.  Cranial Nerves II - XII - II - Visual field intact OU.  III, IV, VI - Extraocular movements intact. V - Facial sensation intact bilaterally. VII - Facial movement intact bilaterally. VIII - Hearing & vestibular intact bilaterally. X - Palate elevates symmetrically, dysarthria. XI - Chin turning & shoulder shrug intact bilaterally. XII - Tongue protrusion intact.  Motor Strength - The patient's strength was right UE 4-/5, RLE distal 4+/5 and proximal 3+/5, LUE and LLE 5/5 and pronator drift was absent.  Bulk was normal and fasciculations were absent.   Motor Tone - Muscle tone was assessed at the neck and appendages and was normal.  Reflexes - The patient's reflexes were 1+ in all extremities and he had no pathological reflexes.  Sensory - Light touch, temperature/pinprick were assessed and were symmetrical.    Coordination - The patient had normal movements in the hands and feet with no ataxia or dysmetria.  Tremor was absent.  Gait and Station - deferred due safety concerns.   ASSESSMENT/PLAN Mr. Winner Valeriano is a 59 y.o. male with history of HTN, DM type 2, hyperlipidemia, CAD s/p  CABG, tobacco abuse, right peri-Rolandic cortex, right occipital pole and subcortical infarcts on 10/04/14, PAD, diabetic neuropathy, returns to the ED with complains of left sided numbness-tingling, heaviness/weakness left hand and leg, and slurred speech. He did not receive IV t-PA due to being out of the window.   Stroke:  Dominant new right MCA territory infarcts in the setting of previous R peri-rolandic subcortical and R occipital infarcts 1 week ago, infarcts once thought to be due to small vessel disease, now thought to be embolic secondary to either low EF or other source  MRI  New right cerebral hemisphere, MCA and border zone arterial distribution; previous R peri-rolandic cortical and subcortical white matter infarcts, R occipital infarcts last week  CTA head and neck with atherosclerotic disease with stenosis at carotid siphons L>R, VA and prox BA  2D Echo  EF 25-30% last week  TEE today. EP evaluating for possible loop or ICD  LDL 100, not at goal  HgbA1c 12.1, not at goal  Lovenox 40 mg sq daily for VTE prophylaxis  Diet NPO time specified Except for: Sips with Meds  clopidogrel 75 mg orally every day ordered at discharge, but had not had prescription filled prior to this admission, now on aspirin 81 mg orally every day and clopidogrel 75 mg orally every day  Therapy recommendations:  OP PT  Disposition:  Anticipate return home  Cardiomyopathy  TTE showed EF 25-30%  Cardiology on board  Will do TEE to confirm  On beta blocker  Consider cardiac cath if EF still low  Hypertension  More elevated this admission than last Permissive hypertension (OK if <220/120) for 24-48 hours post stroke and then gradually normalized within 5-7 days. stable  Hyperlipidemia  Home meds:  lipitor 40mg ,  resumed in hospital  LDL 100 (10/05/2014), goal < 70  Increase lipitor to 80mg    Continue statin at discharge  Diabetes  HgbA1c 12.2 (10/05/2014), goal <  7.0  Uncontrolled  Dm education  Management as per primary team  Other Stroke Risk Factors  Cigarette smoker, quit smoking JAN 2016   Obesity, Body mass index is 36.66 kg/(m^2). reports he goes to the gym 6 days/week.  Coronary artery disease  PAD  Other Active Problems  Chronic systolic CHF  Hospital day # [place] for Pager information 10/11/2014 12:49 PM   I, the attending vascular neurologist, have personally obtained a history, examined  the patient, evaluated laboratory data, individually viewed imaging studies and agree with radiology interpretations. Together with the NP/PA, we formulated the assessment and plan of care which reflects our mutual decision.  I have made any additions or clarifications directly to the above note and agree with the findings and plan as currently documented.   59 yo M with hx of HTN, DM, HLD, CAD s/p CABG, PAD, former smoker, obese admitted one week ago for right MCA scattered small infarcts. Put on plavix. This time admitted again with more scattered infarcts at right MCA. 2D echo showed EF 25-30%, TEE is pending. CTA head and neck showed carotid siphon stenosis; R>L.  He is on dural antiplatelet now, LDL 100, lipitor dose increased to 80mg . A1C was high, glucose not in good control. Need aggressive treatment. However, EF 55% with embolic stroke X 2 may warrant the use of anticoagulation for stroke prevention.   Rosalin Hawking, MD PhD Stroke Neurology 10/11/2014 9:56 PM       To contact Stroke Continuity provider, please refer to [email or web address] After hours, contact General Neurology

## 2014-10-12 ENCOUNTER — Inpatient Hospital Stay: Payer: Medicare HMO | Admitting: Internal Medicine

## 2014-10-12 DIAGNOSIS — I255 Ischemic cardiomyopathy: Secondary | ICD-10-CM | POA: Diagnosis present

## 2014-10-12 LAB — GLUCOSE, CAPILLARY
GLUCOSE-CAPILLARY: 197 mg/dL — AB (ref 65–99)
GLUCOSE-CAPILLARY: 171 mg/dL — AB (ref 65–99)
Glucose-Capillary: 147 mg/dL — ABNORMAL HIGH (ref 65–99)
Glucose-Capillary: 173 mg/dL — ABNORMAL HIGH (ref 65–99)
Glucose-Capillary: 236 mg/dL — ABNORMAL HIGH (ref 65–99)
Glucose-Capillary: 253 mg/dL — ABNORMAL HIGH (ref 65–99)
Glucose-Capillary: 74 mg/dL (ref 65–99)

## 2014-10-12 LAB — CBC
HEMATOCRIT: 39.1 % (ref 39.0–52.0)
Hemoglobin: 13.2 g/dL (ref 13.0–17.0)
MCH: 29.8 pg (ref 26.0–34.0)
MCHC: 33.8 g/dL (ref 30.0–36.0)
MCV: 88.3 fL (ref 78.0–100.0)
Platelets: 253 10*3/uL (ref 150–400)
RBC: 4.43 MIL/uL (ref 4.22–5.81)
RDW: 12.8 % (ref 11.5–15.5)
WBC: 8.2 10*3/uL (ref 4.0–10.5)

## 2014-10-12 LAB — BASIC METABOLIC PANEL
Anion gap: 6 (ref 5–15)
BUN: 25 mg/dL — ABNORMAL HIGH (ref 6–20)
CO2: 29 mmol/L (ref 22–32)
Calcium: 8.7 mg/dL — ABNORMAL LOW (ref 8.9–10.3)
Chloride: 100 mmol/L — ABNORMAL LOW (ref 101–111)
Creatinine, Ser: 1.21 mg/dL (ref 0.61–1.24)
GFR calc non Af Amer: >60 mL/min (ref >60)
GLUCOSE: 188 mg/dL — AB (ref 65–99)
Potassium: 3.7 mmol/L (ref 3.5–5.1)
Sodium: 135 mmol/L (ref 135–145)

## 2014-10-12 MED ORDER — CHLORHEXIDINE GLUCONATE 4 % EX LIQD
60.0000 mL | Freq: Once | CUTANEOUS | Status: DC
  Filled 2014-10-12 (×2): qty 60

## 2014-10-12 MED ORDER — LIDOCAINE-EPINEPHRINE 1 %-1:100000 IJ SOLN
INTRAMUSCULAR | Status: AC
  Filled 2014-10-12: qty 1

## 2014-10-12 MED ORDER — SODIUM CHLORIDE 0.9 % IV SOLN: INTRAVENOUS | Status: DC

## 2014-10-12 MED ORDER — CEFAZOLIN SODIUM-DEXTROSE 2-3 GM-% IV SOLR
2.0000 g | INTRAVENOUS | Status: DC
  Filled 2014-10-12: qty 50

## 2014-10-12 MED ORDER — SODIUM CHLORIDE 0.9 % IR SOLN
80.0000 mg | Status: DC
  Filled 2014-10-12: qty 2

## 2014-10-12 MED ORDER — CHLORHEXIDINE GLUCONATE 4 % EX LIQD
60.0000 mL | Freq: Once | CUTANEOUS | Status: AC
  Administered 2014-10-12: 4 via TOPICAL
  Filled 2014-10-12: qty 60

## 2014-10-12 MED ORDER — INSULIN ASPART 100 UNIT/ML ~~LOC~~ SOLN
10.0000 [IU] | Freq: Three times a day (TID) | SUBCUTANEOUS | Status: DC
  Administered 2014-10-12 – 2014-10-14 (×5): 10 [IU] via SUBCUTANEOUS

## 2014-10-12 NOTE — Care Management Note (Signed)
Case Management Note  Patient Details  Name: Thomas Sosa MRN: [id number] Date of Birth: 1955-10-07  Subjective/Objective:                    Action/Plan: Met with patient to discuss discharge planning. Patient is agreeable to outpatient PT/OT and has chosen [facility].  Orders were faxed and patient was provided with written information.  Appointment information was added to the AVS.  Expected Discharge Date:  10/13/14               Expected Discharge Plan:  Home/Self Care (Patient lives at home with daughter)  In-House Referral:     Discharge planning Services  CM Consult  Post Acute Care Choice:    Choice offered to:  Patient  DME Arranged:    DME Agency:     HH Arranged:    [place] Agency:     Status of Service:  Completed, signed off  Medicare Important Message Given:  Yes-second notification given Date Medicare IM Given:    Medicare IM give by:    Date Additional Medicare IM Given:    Additional Medicare Important Message give by:     If discussed at [place] of Stay Meetings, dates discussed:    Additional Comments:  Rolm Baptise, RN 10/12/2014, 4:56 PM

## 2014-10-12 NOTE — Progress Notes (Signed)
SUBJECTIVE: The patient is doing well today.  At this time, he denies chest pain, shortness of breath, or any new concerns.  Records received from previous hospital - EF has been 30-35% since 2010.   CURRENT MEDICATIONS: . aspirin EC  81 mg Oral Daily  . aspirin  300 mg Rectal Once  . atorvastatin  80 mg Oral Daily  . clopidogrel  75 mg Oral Daily  . enoxaparin (LOVENOX) injection  40 mg Subcutaneous Q24H  . gabapentin  600 mg Oral TID  . insulin aspart  0-15 Units Subcutaneous 6 times per day  . insulin aspart  10 Units Subcutaneous TID WC  . insulin detemir  35 Units Subcutaneous BID  . lisinopril  20 mg Oral Daily  . metoprolol tartrate  12.5 mg Oral Daily  . pantoprazole (PROTONIX) IV  40 mg Intravenous QHS  . sertraline  50 mg Oral Daily   . sodium chloride 500 mL (10/11/14 1349)    OBJECTIVE: Physical Exam: Filed Vitals:   10/11/14 1709 10/11/14 2048 10/12/14 0248 10/12/14 0526  BP: 138/64 145/88 90/54 119/65  Pulse: 65 80 81 67  Temp: 97.6 F (36.4 C) 98.8 F (37.1 C) 98.6 F (37 C) 98.4 F (36.9 C)  TempSrc: Oral Oral Oral Oral  Resp: 20 18 18 18   Height:      Weight:      SpO2: 100% 99% 99% 100%    Intake/Output Summary (Last 24 hours) at 10/12/14 0905 Last data filed at 10/11/14 1645  Gross per 24 hour  Intake    240 ml  Output      0 ml  Net    240 ml    Telemetry reveals sinus rhythm with occasional PVC's  GEN- The patient is well appearing, alert and oriented x 3 today.   Head- normocephalic, atraumatic Eyes-  Sclera clear, conjunctiva pink Ears- hearing intact Oropharynx- clear Neck- supple  Lungs- Clear to ausculation bilaterally, normal work of breathing Heart- Regular rate and rhythm  GI- soft, NT, ND, + BS Extremities- no clubbing, cyanosis, or edema Skin- no rash or lesion Psych- euthymic mood, full affect Neuro- strength and sensation are intact  LABS: Basic Metabolic Panel:  Recent Labs  10/10/14 1625 10/10/14 1653  10/12/14 0515  NA 136 139 135  K 4.3 4.3 3.7  CL 102 100* 100*  CO2 27  --  29  GLUCOSE 329* 332* 188*  BUN 18 22* 25*  CREATININE 0.95 0.90 1.21  CALCIUM 9.4  --  8.7*   Liver Function Tests:  Recent Labs  10/10/14 1625  AST 18  ALT 22  ALKPHOS 69  BILITOT 0.6  PROT 7.7  ALBUMIN 3.3*    CBC:  Recent Labs  10/10/14 1625 10/10/14 1653 10/12/14 0515  WBC 6.9  --  8.2  NEUTROABS 3.7  --   --   HGB 14.7 16.0 13.2  HCT 42.0 47.0 39.1  MCV 87.7  --  88.3  PLT 270  --  253    RADIOLOGY: Ct Angio Head W/cm &/or Wo Cm 10/11/2014   CLINICAL DATA:  New onset left-sided weakness. New acute infarctions in the right hemisphere.  EXAM: CT ANGIOGRAPHY HEAD AND NECK  TECHNIQUE: Multidetector CT imaging of the head and neck was performed using the standard protocol during bolus administration of intravenous contrast. Multiplanar CT image reconstructions and MIPs were obtained to evaluate the vascular anatomy. Carotid stenosis measurements (when applicable) are obtained utilizing NASCET criteria, using the distal  internal carotid diameter as the denominator.  CONTRAST:  127mL OMNIPAQUE IOHEXOL 350 MG/ML SOLN  COMPARISON:  MRI 10/10/2014  FINDINGS: CT HEAD  The brain shows generalized atrophy. There chronic small-vessel ischemic changes throughout the white matter. Sub cm acute infarctions in the right hemisphere in a watershed distribution are evident. No evidence of mass effect or hemorrhage. No hydrocephalus or extra-axial collection.  CTA NECK  Aortic arch: There is atherosclerosis of the aorta but no aneurysm or dissection. Branching pattern of the brachiocephalic vessel origins from the arch is normal without origin stenosis.  Right carotid system: Common carotid artery shows mild atherosclerotic irregularity but no stenosis. At the carotid bifurcation, there is atherosclerotic plaque. Maximal stenosis of the proximal ICA is only 20%. There is mild irregularity in the ICA bulb. Cervical ICA  beyond the bulb is widely patent.  Left carotid system: Common carotid artery widely patent to the bifurcation region with mild atherosclerotic irregularity. Atherosclerotic disease at the carotid bifurcation with 20% stenosis of the proximal ICA. Beyond that, the internal carotid artery is widely patent through the cervical region, though quite tortuous beneath the skullbase.  Vertebral arteries:50% stenosis at the nondominant right vertebral artery origin. No stenosis of the dominant left vertebral artery origin. Both vessels are patent through the cervical region.  Skeleton: Moderate cervical spondylosis  Other neck: No mass or lymphadenopathy.  CTA HEAD  Anterior circulation: Both internal carotid arteries are patent through the skullbase. There is extensive atherosclerotic calcification in the siphon regions. Stenosis is estimated at 50-70% in the siphon regions bilaterally. Beyond that, the anterior and middle cerebral vessels are patent without proximal stenosis. No aneurysm or vascular malformation. No definite missing distal branches.  Posterior circulation: Both vertebral arteries are patent at the foramen magnum. There is calcified plaque affecting the distal vertebral arteries bilaterally. Both vertebral arteries are patent to the basilar. On the right, there are serial stenoses measuring 50-70%. On the left, there is stenosis of 50%. There is atherosclerotic disease of the basilar artery with proximal basilar stenosis of 50%. Distally, the vessel is widely patent. Major posterior circulation branch vessels appear patent.  Venous sinuses: Patent and normal  Anatomic variants: None significant  Delayed phase: Low level enhancement in the right parietal cortical and subcortical brain related to subacute infarction in those locations.  IMPRESSION: Atherosclerotic disease at both carotid bifurcations but no stenosis greater than 20% on either side.  Severe intracranial atherosclerotic disease affecting the  carotid siphon regions. Serial 50-70% stenoses in the carotid siphon regions could be hemodynamically significant. No focal proximal anterior or middle cerebral artery finding.  50% stenosis of the right vertebral artery origin. No stenosis of the dominant left vertebral artery. Advanced atherosclerotic disease of both vertebral arteries in the intracranial portion with serial stenoses of 50-70%. 50% stenosis of the proximal basilar artery.   Electronically Signed   By: Nelson Chimes M.D.   On: 10/11/2014 12:54   Dg Chest 2 View 10/04/2014   CLINICAL DATA:  Shortness of breath and cough  EXAM: CHEST - 2 VIEW  COMPARISON:  None.  FINDINGS: Postsurgical changes are noted. The cardiac shadow is within normal limits. The lungs are clear bilaterally. No acute bony abnormality is noted.  IMPRESSION: No active disease.   Electronically Signed   By: Inez Catalina M.D.   On: 10/04/2014 13:03   ASSESSMENT AND PLAN:  Principal Problem:   Acute embolic stroke Active Problems:   DM type 2 (diabetes mellitus, type 2)  HTN (hypertension)   Hyperlipidemia   Coronary artery disease   Peripheral arterial disease   Type 2 diabetes mellitus with peripheral neuropathy   Stroke   Systolic CHF, chronic  1.  Ischemic cardiomyopathy, non-sustained VT, prior syncope The patient has a long standing ischemic cardiomyopathy (at least since 2010) despite guideline directed therapy.  He has also had NSVT on telemetry as well as prior syncope.  ICD implantation is recommended.  Risks, benefits discussed with the patient today who wishes to proceed.  Will plan ICD implant at the next available time.  With stroke this admission, would either need Visia AF single chamber device or dual chamber ICD to look for atrial fibrillation.   2.  HTN Stable No change required today  3.  CAD s/p CABG No recent ischemic symptoms Continue medical therapy  4.  Chronic systolic heart failure Euvolemic on exam today Continue medical  therapy  Chanetta Marshall, NP 10/12/2014 9:08 AM  Pt seen and issues reviewed  Have reviewed the potential benefits and risks of ICD implantation including but not limited to death, perforation of heart or lung, lead dislodgement, infection,  device malfunction and inappropriate shocks.  The patient express understanding  and are willing to proceed.    The recommendation from neurology is to use coumadin for secondary prevention based on cardiomyopathy  Hence we will proceed with single chamber ICD as afib issue no longer as relevant  Chanbe metop >> carvedilol based on COMET and will consider bidil  On statins 3

## 2014-10-12 NOTE — Progress Notes (Signed)
Physical Therapy Treatment Patient Details Name: Thomas Sosa MRN: [id number] DOB: 08-11-1955 Today's Date: 10/12/2014    History of Present Illness 59 yo male admitted with L side numbness, L hand and leg heaviness and slurred speech. MRI (+) R MCA infarcts PMH: HTN, DM2, CAD s/p CABG, tobacco abuse, R occipital pol and subcortical infarcts 10/04/14, PAD, Diabetic neuropathy    PT Comments    Patient progressing well towards PT goals. Tolerated stair training today with Min guard assist for stability. Continues to demonstrates heaviness/weakness LLE and bumps into doorways on left occasionally. Mild balance deficits noted with higher level balance activities. Will continue to follow to maximize independence and safe mobility.   Follow Up Recommendations  Outpatient PT     Equipment Recommendations  None recommended by PT    Recommendations for Other Services       Precautions / Restrictions Precautions Precautions: Fall Restrictions Weight Bearing Restrictions: No    Mobility  Bed Mobility               General bed mobility comments: Pt in chair upon arrival  Transfers Overall transfer level: Needs assistance Equipment used: None Transfers: Sit to/from Stand Sit to Stand: Supervision         General transfer comment: Supervision for safety.   Ambulation/Gait Ambulation/Gait assistance: Supervision Ambulation Distance (Feet): 200 Feet Assistive device: None Gait Pattern/deviations: Step-through pattern;Decreased stride length;Drifts right/left Gait velocity: decreased   General Gait Details: Slow, mildly unsteady gait but no overt LOB. Bumping into doorways on left x2. Decreased foot clearance LLE at times. pt reports heaviness.   Stairs Stairs: Yes Stairs assistance: Min guard Stair Management: One rail Right;Alternating pattern Number of Stairs: 12 General stair comments: min guard for stability  Wheelchair Mobility    Modified Rankin (Stroke  Patients Only) Modified Rankin (Stroke Patients Only) Pre-Morbid Rankin Score: No symptoms Modified Rankin: Moderately severe disability     Balance Overall balance assessment: Needs assistance Sitting-balance support: Feet supported;No upper extremity supported Sitting balance-Leahy Scale: Good     Standing balance support: During functional activity Standing balance-Leahy Scale: Fair   Single Leg Stance - Right Leg: 2 Single Leg Stance - Left Leg: 1         High level balance activites: Direction changes;Turns;Sudden stops;Head turns High Level Balance Comments: Tolerated above balance activities with mild deviations in gait esp with head turns but no overt LOB.    Cognition Arousal/Alertness: Awake/alert Behavior During Therapy: WFL for tasks assessed/performed Overall Cognitive Status: Within Functional Limits for tasks assessed                      Exercises      General Comments General comments (skin integrity, edema, etc.): Pt able to pick items up from floor with no LOB      Pertinent Vitals/Pain Pain Assessment: No/denies pain Faces Pain Scale: Hurts a little bit Pain Location: feet Pain Intervention(s): Patient requesting pain meds-RN notified (RN arrived to room, pt requested his medications)    Home Living Family/patient expects to be discharged to:: Private residence Living Arrangements: Alone;Children (40 y/o Daughter visiting from [place]) Available Help at Discharge: Family;Friend(s);Neighbor;Available 24 hours/day Type of Home: Apartment Home Access: Stairs to enter Entrance Stairs-Rails: Can reach both Home Layout: One level Home Equipment: Cane - single point      Prior Function Level of Independence: Independent          PT Goals (current goals can now be found in  the care plan section) Acute Rehab PT Goals Patient Stated Goal: to go home Progress towards PT goals: Progressing toward goals    Frequency  Min 3X/week    PT  Plan Current plan remains appropriate    Co-evaluation             End of Session Equipment Utilized During Treatment: Gait belt Activity Tolerance: Patient tolerated treatment well Patient left: in chair;with call bell/phone within reach;with chair alarm set     Time: 8127-5170 PT Time Calculation (min) (ACUTE ONLY): 17 min  Charges:  $Gait Training: 8-22 mins                    G Codes:      Thomas Sosa 10/12/2014, 12:18 PM Wray Kearns, [place], DPT [phone]

## 2014-10-12 NOTE — Progress Notes (Signed)
STROKE TEAM PROGRESS NOTE   HISTORY Thomas Sosa is an 59 y.o. male with a past medical history that is relevant for HTN, DM type 2, hyperlipidemia, CAD s/p CABG, tobacco abuse, right peri-Rolandic cortex, right occipital pole and subcortical infarcts on 10/04/14, PAD, diabetic neuropathy, returns to the ED with complains of left sided numbness-tingling, heaviness/weakness left hand and leg, and slurred speech. He indicated that he had " a lot of left sided numbness at the time of my recent stroke, but they went completely away and came back again yesterday morning". Patient tells me that he went to the gym and while exercising in the treadmill started experiencing numbness in the left side of his trunk, left leg, and left Hockman. Subsequently noticed difficulty using his left hand and leg as well as slurred speech. Said that he has been having HA lately, but denies associated vertigo, double vision, difficulty swallowing, confusion, or vision impairment. Takes aspirin daily. MRI brain performed revealed increased number of acute infarcts in the right cerebral hemisphere, MCA and border zone arterial distribution. Mr. Homan stated that he was diagnosed with " irregularly disorganized heart rhythm after my heart surgery last year" but said that he never being on anticoagulation. He was last known well: 10/08/14, time unknown.  Patient was not administered TPA secondary to out of the window. He was admitted for further evaluation and treatment.   SUBJECTIVE (INTERVAL HISTORY) Patient up in chair, trying to get in contact with his daughter, who is a Therapist, sports in [place]. Cardiology recommended ICD for cardiomyopathy and rhythm monitoring. However, due to    OBJECTIVE Temp:  [97.6 F (36.4 C)-98.8 F (37.1 C)] 98.2 F (36.8 C) (07/19 0932) Pulse Rate:  [61-81] 80 (07/19 0932) Cardiac Rhythm:  [-] Normal sinus rhythm (07/18 1940) Resp:  [10-25] 16 (07/19 0932) BP: (90-177)/(54-134) 99/59 mmHg (07/19 0932) SpO2:  [95  %-100 %] 98 % (07/19 0932)   Recent Labs Lab 10/11/14 1605 10/11/14 2032 10/12/14 0011 10/12/14 0319 10/12/14 0739  GLUCAP 289* 277* 253* 197* 173*    Recent Labs Lab 10/06/14 0515 10/10/14 1625 10/10/14 1653 10/12/14 0515  NA 134* 136 139 135  K 3.6 4.3 4.3 3.7  CL 101 102 100* 100*  CO2 25 27  --  29  GLUCOSE 163* 329* 332* 188*  BUN 28* 18 22* 25*  CREATININE 1.15 0.95 0.90 1.21  CALCIUM 8.6* 9.4  --  8.7*    Recent Labs Lab 10/06/14 0515 10/10/14 1625  AST 15 18  ALT 19 22  ALKPHOS 59 69  BILITOT 0.6 0.6  PROT 7.6 7.7  ALBUMIN 3.1* 3.3*    Recent Labs Lab 10/06/14 0515 10/10/14 1625 10/10/14 1653 10/12/14 0515  WBC 6.4 6.9  --  8.2  NEUTROABS  --  3.7  --   --   HGB 13.9 14.7 16.0 13.2  HCT 41.7 42.0 47.0 39.1  MCV 89.1 87.7  --  88.3  PLT 246 270  --  253   No results for input(s): CKTOTAL, CKMB, CKMBINDEX, TROPONINI in the last 168 hours.  Recent Labs  10/10/14 1625  LABPROT 14.7  INR 1.13   No results for input(s): COLORURINE, LABSPEC, PHURINE, GLUCOSEU, HGBUR, BILIRUBINUR, KETONESUR, PROTEINUR, UROBILINOGEN, NITRITE, LEUKOCYTESUR in the last 72 hours.  Invalid input(s): APPERANCEUR     Component Value Date/Time   CHOL 179 10/05/2014 0540   TRIG 253* 10/05/2014 0540   HDL 28* 10/05/2014 0540   CHOLHDL 6.4 10/05/2014 0540   VLDL 51*  10/05/2014 0540   LDLCALC 100* 10/05/2014 0540   Lab Results  Component Value Date   HGBA1C 12.1* 10/05/2014   No results found for: LABOPIA, COCAINSCRNUR, LABBENZ, AMPHETMU, THCU, LABBARB  No results for input(s): ETH in the last 168 hours.   Mr Brain Wo Contrast 10/04/2014    IMPRESSION: 1. Small subacute appearing infarcts in the right peri-Rolandic cortex and subcortical white matter. Similar small infarcts in the right occipital pole. Minimal petechial hemorrhage with no mass effect. 2. Evidence of underlying chronic small vessel ischemia in both cerebral hemispheres, and the right cerebellum.    Ct Angio Chest Aorta W/cm &/or Wo/cm 10/04/2014  IMPRESSION: 1. No evidence of acute aortic dissection, aneurysm or other acute vascular abnormality. 2. Cardiomegaly with left ventricular dilatation. 3. Advanced calcification of the coronary arteries. Post surgical changes of prior multivessel CABG. Although evaluation is limited by non cardiac gated technique, the proximal aspect of the bypass grafts opacify with contrast material suggesting patency.   Electronically Signed   By: Jacqulynn Cadet M.D.   On: 10/04/2014 15:04   Mr Brain Wo Contrast 10/10/2014   Since 10/04/2014, increased number of acute infarcts in the right cerebral hemisphere, MCA and border zone arterial distribution. Petechial hemorrhage is minimal and stable from 10/04/2014.     Ct Angio Head & Neck W/cm &/or Wo Cm 10/11/2014   Atherosclerotic disease at both carotid bifurcations but no stenosis greater than 20% on either side.  Severe intracranial atherosclerotic disease affecting the carotid siphon regions. Serial 50-70% stenoses in the carotid siphon regions could be hemodynamically significant. No focal proximal anterior or middle cerebral artery finding.  50% stenosis of the right vertebral artery origin. No stenosis of the dominant left vertebral artery. Advanced atherosclerotic disease of both vertebral arteries in the intracranial portion with serial stenoses of 50-70%. 50% stenosis of the proximal basilar artery.     Carotid Ultrasound - Bilateral: 1-39% ICA stenosis. Vertebral artery flow is antegrade.  TTE - Left ventricle: The cavity size was moderately dilated. Systolicfunction was severely reduced. The estimated ejection fractionwas in the range of 25% to 30%. Diffuse hypokinesis. - Left atrium: The atrium was mildly dilated. - Right atrium: The atrium was mildly dilated. Impressions:  No cardiac source of emboli was indentified.  TEE - Left ventricle: The cavity size was moderately dilated. Systolicfunction  was severely reduced. The estimated ejection fractionwas in the range of 25% to 30%. Diffuse hypokinesis. - No cardiac source of emboli was indentified. Negative bubble study. - Aorta: Mild atherosclerotic plaque.    PHYSICAL EXAM  Temp:  [97.6 F (36.4 C)-98.8 F (37.1 C)] 98.2 F (36.8 C) (07/19 0932) Pulse Rate:  [61-81] 80 (07/19 0932) Resp:  [10-25] 16 (07/19 0932) BP: (90-177)/(54-134) 99/59 mmHg (07/19 0932) SpO2:  [95 %-100 %] 98 % (07/19 0932)  General - Well nourished, well developed, in no apparent distress.  Ophthalmologic - Fundi not visualized due to small pupils.  Cardiovascular - Regular rate and rhythm with no murmur.  Mental Status -  Level of arousal and orientation to time, place, and person were intact. Language including expression, naming, repetition, comprehension was assessed and found intact. Fund of Knowledge was assessed and was intact.  Cranial Nerves II - XII - II - Visual field intact OU. III, IV, VI - Extraocular movements intact. V - Facial sensation intact bilaterally. VII - Facial movement intact bilaterally. VIII - Hearing & vestibular intact bilaterally. X - Palate elevates symmetrically, dysarthria. XI - Chin turning &  shoulder shrug intact bilaterally. XII - Tongue protrusion intact.  Motor Strength - The patient's strength was right UE 4-/5, RLE distal 4+/5 and proximal 3+/5, LUE and LLE 5/5 and pronator drift was absent.  Bulk was normal and fasciculations were absent.   Motor Tone - Muscle tone was assessed at the neck and appendages and was normal.  Reflexes - The patient's reflexes were 1+ in all extremities and he had no pathological reflexes.  Sensory - Light touch, temperature/pinprick were assessed and were symmetrical.    Coordination - The patient had normal movements in the hands and feet with no ataxia or dysmetria.  Tremor was absent.  Gait and Station - deferred due safety concerns.   ASSESSMENT/PLAN Mr. Eulas Schweitzer is a 59 y.o. male with history of HTN, DM type 2, hyperlipidemia, CAD s/p CABG, tobacco abuse, right peri-Rolandic cortex, right occipital pole and subcortical infarcts on 10/04/14, PAD, diabetic neuropathy, returns to the ED with complains of left sided numbness-tingling, heaviness/weakness left hand and leg, and slurred speech. He did not receive IV t-PA due to being out of the window.   Stroke:  Dominant new right MCA territory infarcts in the setting of previous R peri-rolandic subcortical and R occipital infarcts 1 week ago, infarcts once thought to be due to small vessel disease, now thought to be embolic secondary to either low EF or other source  MRI  New right cerebral hemisphere, MCA and border zone arterial distribution; previous R peri-rolandic cortical and subcortical white matter infarcts, R occipital infarcts last week  CTA head and neck with atherosclerotic disease with stenosis at carotid siphons L>R, VA and prox BA. No significant extracranial ICA stenosis.  2D Echo  EF 25-30% last week  TEE EF 25-30%, no SOE. EP for ICD placement that can also monitor for atrial fibrillation   LDL 100, not at goal  HgbA1c 12.1, not at goal  Lovenox 40 mg sq daily for VTE prophylaxis Diet heart healthy/carb modified Room service appropriate?: Yes; Fluid consistency:: Thin  clopidogrel 75 mg orally every day ordered at discharge, but had not had prescription filled prior to this admission, now on aspirin 81 mg orally every day and clopidogrel 75 mg orally every day. Dr. Erlinda Hong discussed anticoagulation with EP - they are ok for more aggressive treatment than antiplatelets - would recommend delay 4-5 days post ICD for wound healing. Plavix can be d/c once INR 2-3. ASA 81mg  can be maintained for cardiac prevention.  Therapy recommendations:  OP PT  Disposition:  Anticipate return home  Follow up With Dr. Erlinda Hong in 2 months, order written  Cardiomyopathy  TTE showed EF 25-30%  Cardiology on  board  On beta blocker  Stable  Dr. Erlinda Hong recommends anticoagulation with warfarin for secondary stroke prevention once ICD implanted and would healed. Plavix can be d/c once INR 2-3. ASA 81mg  can be maintained for cardiac prevention.  Hypertension  More elevated this admission than last Permissive hypertension (OK if <220/120) for 24-48 hours post stroke and then gradually normalized within 5-7 days. stable  Hyperlipidemia  Home meds:  lipitor 40mg ,  resumed in hospital  LDL 100 (10/05/2014), goal < 70  Increase lipitor to 80mg    Continue statin at discharge  Diabetes  HgbA1c 12.2 (10/05/2014), goal < 7.0  Uncontrolled  Dm education  Management as per primary team  Other Stroke Risk Factors  Cigarette smoker, quit smoking JAN 2016   Obesity, Body mass index is 36.66 kg/(m^2). reports he goes to the  gym 6 days/week.  Coronary artery disease  S/p CABG  PAD  Other Active Problems  Chronic systolic CHF  Ischemic cardiomyopathy, non-sustained VT, prior syncope. ICD implantation planned by Cardiology.   Medication compliance was emphasized.  Hospital day # Ali Chuk for Pager information 10/12/2014 11:03 AM   I, the attending vascular neurologist, have personally obtained a history, examined the patient, evaluated laboratory data, individually viewed imaging studies and agree with radiology interpretations. I also discussed with pt and Dr. Grandville Silos regarding his care plan. Together with the NP/PA, we formulated the assessment and plan of care which reflects our mutual decision.  I have made any additions or clarifications directly to the above note and agree with the findings and plan as currently documented.   59 yo M with hx of HTN, DM, HLD, CAD s/p CABG, PAD, former smoker, obese admitted one week ago for right MCA scattered small infarcts. Put on plavix. This time admitted again with more scattered infarcts at right MCA.  2D echo showed EF 25-30%, TEE on source of clot but EF 25-30%. CTA head and neck showed carotid siphon stenosis, R>L, no extracranial ICA stenosis. He is on dural antiplatelet now, LDL 100, lipitor dose increased to 80mg . A1C was high, glucose not in good control. Need aggressive treatment. However, EF 86% with embolic stroke X 2 warrants the use of anticoagulation for stroke prevention. Discussed with cardiology and they agree with coumadin but would like to initiate 4-5 days after ICD placement to allow wound healing. Decisions discussed with Dr. Grandville Silos.   Neurology will sign off. Please call with questions. Pt will follow up with Dr. Erlinda Hong at [facility] in about 2 months. Thanks for the consult.  Rosalin Hawking, MD PhD Stroke Neurology 10/12/2014 1:38 PM   To contact Stroke Continuity provider, please refer to [email or web address] After hours, contact General Neurology

## 2014-10-12 NOTE — Progress Notes (Addendum)
Speech Language Pathology Treatment: Dysphagia  Patient Details Name: Thomas Sosa MRN: [id number] DOB: [birth year] Today's Date: 10/12/2014 Time: 9480-1655 SLP Time Calculation (min) (ACUTE ONLY): 9 min  Assessment / Plan / Recommendation Clinical Impression  Pt with clean breakfast tray at bedside!  Observed pt consuming water via straw *what he had suspected to aspirate with upon admit - No overt s/s of aspiration apparent.  Wet voice noted x1 with pt benefiting from verbal cues to clear throat, advised pt to laryngeal penetration of secretions or po when voice wet and indication to clear = using teach back, pt reiterated strategy.  Pt reports speech and swallowing has improved since most recent event.   He demonstrated good cognition by stating MD may consider blood thinner for him and need to call for assistance.  Pt also requested his diabetic neuropathy medication.  Speech/language/cognition screened and appropriate for pt's current environment.   Pt tolerating intake well with great intake.  Suspect swallow is back to baseline.  SLP to sign off, please reorder if indicated.    HPI Other Pertinent Information: Thomas Sosa is a 59 y.o. 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 Patient passed RN stroke swallow screen on previous admission but failed this admission due to coughing with water.   Pt seen to assess po tolerance and indication for instrumental swallow evaluation.    Pertinent Vitals Pain Assessment: Faces Faces Pain Scale: Hurts a little bit Pain Location: feet Pain Intervention(s): Patient requesting pain meds-RN notified (RN arrived to room, pt requested his medications)  SLP Plan  All goals met    Recommendations Diet  recommendations: Regular;Thin liquid Medication Administration: Whole meds with liquid Supervision: Patient able to self feed Compensations: Slow rate;Small sips/bites Postural Changes and/or Swallow Maneuvers: Seated upright 90 degrees;Upright 30-60 min after meal              Oral Care Recommendations: Oral care BID Follow up Recommendations: None Plan: All goals met    [place], [place] [facility] [place]

## 2014-10-12 NOTE — Evaluation (Signed)
Occupational Therapy Evaluation Patient Details Name: Thomas Sosa MRN: [id number] DOB: 12-Nov-1955 Today's Date: 10/12/2014    History of Present Illness 59 yo male admitted with L side numbness, L hand and leg heaviness and slurred speech. MRI (+) R MCA infarcts PMH: HTN, DM2, CAD s/p CABG, tobacco abuse, R occipital pol and subcortical infarcts 10/04/14, PAD, Diabetic neuropathy   Clinical Impression   Pt reports weakness in L UE, but denies sensation or vision deficits. Pt completing ADLs with supervision. Pt active and reports going to gym daily.  Pt scored 21/24 on DGI with no LOB, but with reported fear of falling when bending to pick up objects from floor. Education provided on safety and fall prevention at home. Pt reports daughter from [place] is visiting and will be able to assist with ADLs as needed upon d/c.  Pt would benefit from OP OT to increase safety and independence with ADLs.    Follow Up Recommendations  Outpatient OT;Supervision - Intermittent    Equipment Recommendations  None recommended by OT    Recommendations for Other Services       Precautions / Restrictions Precautions Precautions: Fall Restrictions Weight Bearing Restrictions: No      Mobility Bed Mobility               General bed mobility comments: Pt in chair upon arrival  Transfers Overall transfer level: Needs assistance Equipment used: None Transfers: Sit to/from Stand Sit to Stand: Supervision              Balance Overall balance assessment: Needs assistance Sitting-balance support: No upper extremity supported;Feet supported Sitting balance-Leahy Scale: Good     Standing balance support: No upper extremity supported;During functional activity                     Standardized Balance Assessment Standardized Balance Assessment : Dynamic Gait Index   Dynamic Gait Index Level Surface: Normal Change in Gait Speed: Normal Gait with Horizontal Head Turns: Mild  Impairment Gait with Vertical Head Turns: Normal Gait and Pivot Turn: Normal Step Over Obstacle: Normal Step Around Obstacles: Mild Impairment Steps: Mild Impairment Total Score: 21      ADL Overall ADL's : At baseline                                       General ADL Comments: Pt able to complete ADLs with supervision     Vision Vision Assessment?: No apparent visual deficits   Perception     Praxis      Pertinent Vitals/Pain Pain Assessment: No/denies pain Faces Pain Scale: Hurts a little bit Pain Location: feet Pain Intervention(s): Patient requesting pain meds-RN notified (RN arrived to room, pt requested his medications)     Hand Dominance Right   Extremity/Trunk Assessment Upper Extremity Assessment Upper Extremity Assessment: LUE deficits/detail LUE Deficits / Details: 3-/5 MMT; decreased grip strenght LUE Coordination: decreased gross motor   Lower Extremity Assessment Lower Extremity Assessment: Defer to PT evaluation   Cervical / Trunk Assessment Cervical / Trunk Assessment: Normal   Communication Communication Communication: Other (comment) (Mild dysarthria)   Cognition Arousal/Alertness: Awake/alert Behavior During Therapy: WFL for tasks assessed/performed Overall Cognitive Status: Within Functional Limits for tasks assessed                     General Comments       Exercises  Shoulder Instructions      Home Living Family/patient expects to be discharged to:: Private residence Living Arrangements: Alone;Children (35 y/o Daughter visiting from [place]) Available Help at Discharge: Family;Friend(s);Neighbor;Available 24 hours/day Type of Home: Apartment Home Access: Stairs to enter Entrance Stairs-Number of Steps: 12 Entrance Stairs-Rails: Can reach both Home Layout: One level     Bathroom Shower/Tub: Tub/shower unit;Curtain Shower/tub characteristics: Architectural technologist: Standard     Home  Equipment: Radio producer - single point      Lives With: Alone    Prior Functioning/Environment Level of Independence: Independent             OT Diagnosis: Generalized weakness;Hemiplegia non-dominant side   OT Problem List: Decreased strength;Decreased range of motion;Impaired balance (sitting and/or standing);Decreased safety awareness;Impaired UE functional use   OT Treatment/Interventions: Self-care/ADL training;Therapeutic exercise;Therapeutic activities;Patient/family education;Balance training    OT Goals(Current goals can be found in the care plan section) Acute Rehab OT Goals Patient Stated Goal: to go home OT Goal Formulation: With patient Time For Goal Achievement: 10/26/14 Potential to Achieve Goals: Good  OT Frequency: Min 3X/week   Barriers to D/C: Decreased caregiver support          Co-evaluation              End of Session Equipment Utilized During Treatment: Gait belt Nurse Communication: Mobility status  Activity Tolerance: Patient tolerated treatment well Patient left: in chair;with call bell/phone within reach;with chair alarm set   Time: 0952-1010 OT Time Calculation (min): 18 min Charges:    G-Codes:    Forest Gleason 10/12/2014, 10:22 AM

## 2014-10-12 NOTE — Progress Notes (Signed)
Pt reports acute onset of increased left sided weakness. MD notified. NIH 2. Will continue to monitor. Bobbye Charleston, RN

## 2014-10-12 NOTE — Care Management Important Message (Signed)
Important Message  Patient Details  Name: Thomas Sosa MRN: [id number] Date of Birth: 05/10/1955   Medicare Important Message Given:  Yes-second notification given    Pricilla Handler 10/12/2014, 11:53 AM

## 2014-10-12 NOTE — Progress Notes (Signed)
[facility] PROGRESS NOTE  Thomas Sosa WCB:[id number] DOB: Apr 21, 1955 DOA: 10/10/2014 PCP: [name], NP  Assessment/Plan: #1 acute stroke MRI with right MCA territory infarcts in the setting of previous right perirenal [place] Subcortical right occipital infarcts 1 week ago. Likely embolic in nature. CT angiogram head and neck with atherosclerosis disease with stenosis of the carotid siphons, vertebral artery, and proximal basilar artery. 2-D echo which was done during last hospitalization last week at a year for 25-30%. Patient is status post TEE with a EF of 25-30% with diffuse hypokinesis, negative bubble study, no source of emboli. Carotid Dopplers were done during last hospitalization a such will not be repeated. LDL was 100. Hemoglobin A1c is 12.1. Patient currently on Plavix 75 mg daily and aspirin 81 mg daily. Patient has been seen by cardiology/EP recommended ICD placement due to long-standing ischemic cardiomyopathy, nonsustained V. tach prior history of syncope and now stroke to be able to also monitor for atrial fibrillation. Neurology has discussed with EP in terms of anticoagulation and recommended to start Coumadin 45 days post ICD placement for wound healing. Once INR is at goal between 2 -3, Plavix can be discontinued. Patient has been seen by physical therapy recommended outpatient PT. Neurology following and appreciate input and recommendations.   #2 hypertension Permissive hypertension secondary to problem #1. Continue lisinopril and metoprolol.  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 CBGs have ranged from 173-236. Continue current dose of Levemir 35 units twice daily. Continue sliding scale insulin. Will start patient on new carbonate insulin with NovoLog 10 units 3 times daily.   #4 hyperlipidemia LDL was 100 on 10/05/2014. Goal LDL is less than 70. Continue Lipitor.  #5 chronic systolic heart failure Stable.  #6  coronary artery disease status post CABG/ischemic cardiomyopathy, nonsustained V. tach, prior history of syncope Stable. Continue aspirin and Plavix. Continue beta blocker and ACE inhibitor. Patient has been seen by EP and recommending ICD implantation which will be done tomorrow. Per neurology and cardiology patient will continue on aspirin and Plavix for at least 4-5 days post ICD placement and then may be started on Coumadin for anticoagulation. Once INR is therapeutic between 2-3 Plavix may be discontinued and Coumadin continued. Cardiology following and appreciate input and recommendations. Follow.  #7 peripheral vascular disease Continue aspirin, Plavix, statin.  #8 prophylaxis PPI for GI prophylaxis. Lovenox for DVT prophylaxis.   Code Status: Full Family Communication: Updated patient. No family at bedside. Disposition Plan: Once ICD has been placed and okay with cardiology for discharge patient may be discharged home on aspirin and Plavix and then started on Coumadin 4-5 days post ICD placement for wound healing or per cardiology recs.   Consultants:  Neurology: Dr. Armida Sans 10/10/2014  Cardiology  EP: Dr. Caryl Comes 10/11/2014  Procedures:  TEE Dr. Meda Coffee 10/11/2014  CT angiogram head and neck 10/11/2014  MRI 10/10/2014  Antibiotics:  None  HPI/Subjective: Patient states left upper extremity numbness and weakness improving. Patient denies shortness of breath. No chest pain.  Objective: Filed Vitals:   10/12/14 1710  BP: 123/75  Pulse: 65  Temp: 98.1 F (36.7 C)  Resp: 20    Intake/Output Summary (Last 24 hours) at 10/12/14 1726 Last data filed at 10/12/14 1337  Gross per 24 hour  Intake    480 ml  Output      0 ml  Net    480 ml   Filed Weights   10/10/14 2103  Weight: 119.16 kg (  262 lb 11.2 oz)    Exam:   General:  NAD  Cardiovascular: RRR  Respiratory: CTAB  Abdomen: Soft, nontender, nondistended, positive bowel sounds.  Musculoskeletal: No  clubbing cyanosis or edema.   Data Reviewed: Basic Metabolic Panel:  Recent Labs Lab 10/06/14 0515 10/10/14 1625 10/10/14 1653 10/12/14 0515  NA 134* 136 139 135  K 3.6 4.3 4.3 3.7  CL 101 102 100* 100*  CO2 25 27  --  29  GLUCOSE 163* 329* 332* 188*  BUN 28* 18 22* 25*  CREATININE 1.15 0.95 0.90 1.21  CALCIUM 8.6* 9.4  --  8.7*   Liver Function Tests:  Recent Labs Lab 10/06/14 0515 10/10/14 1625  AST 15 18  ALT 19 22  ALKPHOS 59 69  BILITOT 0.6 0.6  PROT 7.6 7.7  ALBUMIN 3.1* 3.3*   No results for input(s): LIPASE, AMYLASE in the last 168 hours. No results for input(s): AMMONIA in the last 168 hours. CBC:  Recent Labs Lab 10/06/14 0515 10/10/14 1625 10/10/14 1653 10/12/14 0515  WBC 6.4 6.9  --  8.2  NEUTROABS  --  3.7  --   --   HGB 13.9 14.7 16.0 13.2  HCT 41.7 42.0 47.0 39.1  MCV 89.1 87.7  --  88.3  PLT 246 270  --  253   Cardiac Enzymes: No results for input(s): CKTOTAL, CKMB, CKMBINDEX, TROPONINI in the last 168 hours. BNP (last 3 results) No results for input(s): BNP in the last 8760 hours.  ProBNP (last 3 results) No results for input(s): PROBNP in the last 8760 hours.  CBG:  Recent Labs Lab 10/12/14 0011 10/12/14 0319 10/12/14 0739 10/12/14 1119 10/12/14 1616  GLUCAP 253* 197* 173* 236* 171*    No results found for this or any previous visit (from the past 240 hour(s)).   Studies: Ct Angio Head W/cm &/or Wo Cm  10/11/2014   CLINICAL DATA:  New onset left-sided weakness. New acute infarctions in the right hemisphere.  EXAM: CT ANGIOGRAPHY HEAD AND NECK  TECHNIQUE: Multidetector CT imaging of the head and neck was performed using the standard protocol during bolus administration of intravenous contrast. Multiplanar CT image reconstructions and MIPs were obtained to evaluate the vascular anatomy. Carotid stenosis measurements (when applicable) are obtained utilizing NASCET criteria, using the distal internal carotid diameter as the  denominator.  CONTRAST:  171mL OMNIPAQUE IOHEXOL 350 MG/ML SOLN  COMPARISON:  MRI 10/10/2014  FINDINGS: CT HEAD  The brain shows generalized atrophy. There chronic small-vessel ischemic changes throughout the white matter. Sub cm acute infarctions in the right hemisphere in a watershed distribution are evident. No evidence of mass effect or hemorrhage. No hydrocephalus or extra-axial collection.  CTA NECK  Aortic arch: There is atherosclerosis of the aorta but no aneurysm or dissection. Branching pattern of the brachiocephalic vessel origins from the arch is normal without origin stenosis.  Right carotid system: Common carotid artery shows mild atherosclerotic irregularity but no stenosis. At the carotid bifurcation, there is atherosclerotic plaque. Maximal stenosis of the proximal ICA is only 20%. There is mild irregularity in the ICA bulb. Cervical ICA beyond the bulb is widely patent.  Left carotid system: Common carotid artery widely patent to the bifurcation region with mild atherosclerotic irregularity. Atherosclerotic disease at the carotid bifurcation with 20% stenosis of the proximal ICA. Beyond that, the internal carotid artery is widely patent through the cervical region, though quite tortuous beneath the skullbase.  Vertebral arteries:50% stenosis at the nondominant right vertebral artery  origin. No stenosis of the dominant left vertebral artery origin. Both vessels are patent through the cervical region.  Skeleton: Moderate cervical spondylosis  Other neck: No mass or lymphadenopathy.  CTA HEAD  Anterior circulation: Both internal carotid arteries are patent through the skullbase. There is extensive atherosclerotic calcification in the siphon regions. Stenosis is estimated at 50-70% in the siphon regions bilaterally. Beyond that, the anterior and middle cerebral vessels are patent without proximal stenosis. No aneurysm or vascular malformation. No definite missing distal branches.  Posterior  circulation: Both vertebral arteries are patent at the foramen magnum. There is calcified plaque affecting the distal vertebral arteries bilaterally. Both vertebral arteries are patent to the basilar. On the right, there are serial stenoses measuring 50-70%. On the left, there is stenosis of 50%. There is atherosclerotic disease of the basilar artery with proximal basilar stenosis of 50%. Distally, the vessel is widely patent. Major posterior circulation branch vessels appear patent.  Venous sinuses: Patent and normal  Anatomic variants: None significant  Delayed phase: Low level enhancement in the right parietal cortical and subcortical brain related to subacute infarction in those locations.  IMPRESSION: Atherosclerotic disease at both carotid bifurcations but no stenosis greater than 20% on either side.  Severe intracranial atherosclerotic disease affecting the carotid siphon regions. Serial 50-70% stenoses in the carotid siphon regions could be hemodynamically significant. No focal proximal anterior or middle cerebral artery finding.  50% stenosis of the right vertebral artery origin. No stenosis of the dominant left vertebral artery. Advanced atherosclerotic disease of both vertebral arteries in the intracranial portion with serial stenoses of 50-70%. 50% stenosis of the proximal basilar artery.   Electronically Signed   By: Nelson Chimes M.D.   On: 10/11/2014 12:54   Ct Angio Neck W/cm &/or Wo/cm  10/11/2014   CLINICAL DATA:  New onset left-sided weakness. New acute infarctions in the right hemisphere.  EXAM: CT ANGIOGRAPHY HEAD AND NECK  TECHNIQUE: Multidetector CT imaging of the head and neck was performed using the standard protocol during bolus administration of intravenous contrast. Multiplanar CT image reconstructions and MIPs were obtained to evaluate the vascular anatomy. Carotid stenosis measurements (when applicable) are obtained utilizing NASCET criteria, using the distal internal carotid diameter  as the denominator.  CONTRAST:  184mL OMNIPAQUE IOHEXOL 350 MG/ML SOLN  COMPARISON:  MRI 10/10/2014  FINDINGS: CT HEAD  The brain shows generalized atrophy. There chronic small-vessel ischemic changes throughout the white matter. Sub cm acute infarctions in the right hemisphere in a watershed distribution are evident. No evidence of mass effect or hemorrhage. No hydrocephalus or extra-axial collection.  CTA NECK  Aortic arch: There is atherosclerosis of the aorta but no aneurysm or dissection. Branching pattern of the brachiocephalic vessel origins from the arch is normal without origin stenosis.  Right carotid system: Common carotid artery shows mild atherosclerotic irregularity but no stenosis. At the carotid bifurcation, there is atherosclerotic plaque. Maximal stenosis of the proximal ICA is only 20%. There is mild irregularity in the ICA bulb. Cervical ICA beyond the bulb is widely patent.  Left carotid system: Common carotid artery widely patent to the bifurcation region with mild atherosclerotic irregularity. Atherosclerotic disease at the carotid bifurcation with 20% stenosis of the proximal ICA. Beyond that, the internal carotid artery is widely patent through the cervical region, though quite tortuous beneath the skullbase.  Vertebral arteries:50% stenosis at the nondominant right vertebral artery origin. No stenosis of the dominant left vertebral artery origin. Both vessels are patent through the cervical  region.  Skeleton: Moderate cervical spondylosis  Other neck: No mass or lymphadenopathy.  CTA HEAD  Anterior circulation: Both internal carotid arteries are patent through the skullbase. There is extensive atherosclerotic calcification in the siphon regions. Stenosis is estimated at 50-70% in the siphon regions bilaterally. Beyond that, the anterior and middle cerebral vessels are patent without proximal stenosis. No aneurysm or vascular malformation. No definite missing distal branches.  Posterior  circulation: Both vertebral arteries are patent at the foramen magnum. There is calcified plaque affecting the distal vertebral arteries bilaterally. Both vertebral arteries are patent to the basilar. On the right, there are serial stenoses measuring 50-70%. On the left, there is stenosis of 50%. There is atherosclerotic disease of the basilar artery with proximal basilar stenosis of 50%. Distally, the vessel is widely patent. Major posterior circulation branch vessels appear patent.  Venous sinuses: Patent and normal  Anatomic variants: None significant  Delayed phase: Low level enhancement in the right parietal cortical and subcortical brain related to subacute infarction in those locations.  IMPRESSION: Atherosclerotic disease at both carotid bifurcations but no stenosis greater than 20% on either side.  Severe intracranial atherosclerotic disease affecting the carotid siphon regions. Serial 50-70% stenoses in the carotid siphon regions could be hemodynamically significant. No focal proximal anterior or middle cerebral artery finding.  50% stenosis of the right vertebral artery origin. No stenosis of the dominant left vertebral artery. Advanced atherosclerotic disease of both vertebral arteries in the intracranial portion with serial stenoses of 50-70%. 50% stenosis of the proximal basilar artery.   Electronically Signed   By: Nelson Chimes M.D.   On: 10/11/2014 12:54   Mr Brain Wo Contrast  10/10/2014   CLINICAL DATA:  Left-sided arm numbness, onset today. Recent stroke.  EXAM: MRI HEAD WITHOUT CONTRAST  TECHNIQUE: Multiplanar, multiecho pulse sequences of the brain and surrounding structures were obtained without intravenous contrast.  COMPARISON:  10/04/2014  FINDINGS: Calvarium and upper cervical spine: No focal marrow signal abnormality.  Orbits: No significant findings.  Sinuses and Mastoids: Moderate scattered inflammatory mucosal thickening in the paranasal sinuses. Mastoid and middle ears are clear.   Brain: In addition to the acute infarcts seen 10/04/2014, there are multiple additional acute infarcts in the same general distribution, along the high right frontal parietal convexity, right corona radiata, and in the cortical and subcortical right occipital lobe. The Pattern is best ascribed to MCA and MCA watershed. No infratentorial or contralateral infarcts. Minimal petechial hemorrhage in the high right frontal parietal cortex is stable from previous. Noted negative carotid Doppler, CTA may be useful to evaluate for ulcerated plaque or intracranial stenosis.  Moderate white matter disease, mainly periventricular and deep, likely chronic small vessel disease. Remote lacunar infarct noted in the right periatrial white matter.  No evidence of major vessel occlusion, hydrocephalus, or mass lesion.  IMPRESSION: Since 10/04/2014, increased number of acute infarcts in the right cerebral hemisphere, MCA and border zone arterial distribution. Petechial hemorrhage is minimal and stable from 10/04/2014.   Electronically Signed   By: Monte Fantasia M.D.   On: 10/10/2014 18:09    Scheduled Meds: . aspirin EC  81 mg Oral Daily  . aspirin  300 mg Rectal Once  . atorvastatin  80 mg Oral Daily  . clopidogrel  75 mg Oral Daily  . gabapentin  600 mg Oral TID  . insulin aspart  0-15 Units Subcutaneous 6 times per day  . insulin aspart  10 Units Subcutaneous TID WC  . insulin detemir  35 Units Subcutaneous BID  . lisinopril  20 mg Oral Daily  . metoprolol tartrate  12.5 mg Oral Daily  . pantoprazole (PROTONIX) IV  40 mg Intravenous QHS  . sertraline  50 mg Oral Daily   Continuous Infusions: . sodium chloride 500 mL (10/11/14 1349)    Principal Problem:   Acute embolic stroke Active Problems:   DM type 2 (diabetes mellitus, type 2)   HTN (hypertension)   Hyperlipidemia   Coronary artery disease   Peripheral arterial disease   Type 2 diabetes mellitus with peripheral neuropathy   Stroke   Systolic  CHF, chronic   Cardiomyopathy, ischemic    Time spent: 72 minutes    THOMPSON,DANIEL M.D. [facility] Pager [phone]. If 7PM-7AM, please contact night-coverage at [email or web address], password [facility] 10/12/2014, 5:26 PM  LOS: 2 days

## 2014-10-13 ENCOUNTER — Encounter (HOSPITAL_COMMUNITY)
Admission: EM | Disposition: A | Discharge status: Home / Self Care | Payer: Self-pay | Source: Home / Self Care | Attending: Internal Medicine

## 2014-10-13 ENCOUNTER — Encounter (HOSPITAL_COMMUNITY): Payer: Self-pay | Admitting: Internal Medicine

## 2014-10-13 DIAGNOSIS — I255 Ischemic cardiomyopathy: Secondary | ICD-10-CM

## 2014-10-13 HISTORY — PX: EP IMPLANTABLE DEVICE: SHX172B

## 2014-10-13 LAB — CBC
HCT: 40.1 % (ref 39.0–52.0)
Hemoglobin: 13.7 g/dL (ref 13.0–17.0)
MCH: 30.4 pg (ref 26.0–34.0)
MCHC: 34.2 g/dL (ref 30.0–36.0)
MCV: 88.9 fL (ref 78.0–100.0)
Platelets: 232 10*3/uL (ref 150–400)
RBC: 4.51 MIL/uL (ref 4.22–5.81)
RDW: 12.8 % (ref 11.5–15.5)
WBC: 7.3 10*3/uL (ref 4.0–10.5)

## 2014-10-13 LAB — GLUCOSE, CAPILLARY
GLUCOSE-CAPILLARY: 149 mg/dL — AB (ref 65–99)
GLUCOSE-CAPILLARY: 203 mg/dL — AB (ref 65–99)
GLUCOSE-CAPILLARY: 227 mg/dL — AB (ref 65–99)
GLUCOSE-CAPILLARY: 75 mg/dL (ref 65–99)
Glucose-Capillary: 132 mg/dL — ABNORMAL HIGH (ref 65–99)
Glucose-Capillary: 97 mg/dL (ref 65–99)
Glucose-Capillary: 73 mg/dL (ref 65–99)
Glucose-Capillary: 93 mg/dL (ref 65–99)

## 2014-10-13 LAB — BASIC METABOLIC PANEL WITH GFR
Anion gap: 8 (ref 5–15)
BUN: 21 mg/dL — ABNORMAL HIGH (ref 6–20)
CO2: 28 mmol/L (ref 22–32)
Calcium: 8.7 mg/dL — ABNORMAL LOW (ref 8.9–10.3)
Chloride: 101 mmol/L (ref 101–111)
Creatinine, Ser: 0.93 mg/dL (ref 0.61–1.24)
GFR calc Af Amer: >60 mL/min
GFR calc non Af Amer: >60 mL/min
Glucose, Bld: 126 mg/dL — ABNORMAL HIGH (ref 65–99)
Potassium: 3.9 mmol/L (ref 3.5–5.1)
Sodium: 137 mmol/L (ref 135–145)

## 2014-10-13 SURGERY — ICD IMPLANT

## 2014-10-13 MED ORDER — FENTANYL CITRATE (PF) 100 MCG/2ML IJ SOLN
INTRAMUSCULAR | Status: AC
  Filled 2014-10-13: qty 2

## 2014-10-13 MED ORDER — CEFAZOLIN SODIUM 1-5 GM-% IV SOLN
1.0000 g | Freq: Four times a day (QID) | INTRAVENOUS | Status: AC
  Administered 2014-10-13 – 2014-10-14 (×3): 1 g via INTRAVENOUS
  Filled 2014-10-13 (×4): qty 50

## 2014-10-13 MED ORDER — FENTANYL CITRATE (PF) 100 MCG/2ML IJ SOLN
INTRAMUSCULAR | Status: DC | PRN
  Administered 2014-10-13: 25 ug via INTRAVENOUS
  Administered 2014-10-13: 50 ug via INTRAVENOUS

## 2014-10-13 MED ORDER — DEXTROSE-NACL 5-0.9 % IV SOLN
INTRAVENOUS | Status: AC
  Administered 2014-10-13: 20:00:00 via INTRAVENOUS

## 2014-10-13 MED ORDER — LIDOCAINE HCL (PF) 1 % IJ SOLN
INTRAMUSCULAR | Status: DC | PRN
  Administered 2014-10-13: 60 mL

## 2014-10-13 MED ORDER — SODIUM CHLORIDE 0.9 % IR SOLN
Status: AC
  Filled 2014-10-13: qty 2

## 2014-10-13 MED ORDER — POLYETHYLENE GLYCOL 3350 17 G PO PACK
17.0000 g | PACK | Freq: Two times a day (BID) | ORAL | Status: DC
  Administered 2014-10-14: 17 g via ORAL
  Filled 2014-10-13 (×2): qty 1

## 2014-10-13 MED ORDER — MIDAZOLAM HCL 5 MG/5ML IJ SOLN
INTRAMUSCULAR | Status: DC | PRN
  Administered 2014-10-13 (×2): 1 mg via INTRAVENOUS
  Administered 2014-10-13: 2 mg via INTRAVENOUS

## 2014-10-13 MED ORDER — SODIUM CHLORIDE 0.9 % IR SOLN
Status: DC | PRN
  Administered 2014-10-13: 500 mL

## 2014-10-13 MED ORDER — MIDAZOLAM HCL 5 MG/5ML IJ SOLN
INTRAMUSCULAR | Status: AC
  Filled 2014-10-13: qty 5

## 2014-10-13 MED ORDER — CEFAZOLIN SODIUM-DEXTROSE 2-3 GM-% IV SOLR
INTRAVENOUS | Status: DC | PRN
  Administered 2014-10-13: 2 g via INTRAVENOUS

## 2014-10-13 MED ORDER — LIDOCAINE HCL (PF) 1 % IJ SOLN
INTRAMUSCULAR | Status: AC
  Filled 2014-10-13: qty 60

## 2014-10-13 MED ORDER — HEPARIN (PORCINE) IN NACL 2-0.9 UNIT/ML-% IJ SOLN
INTRAMUSCULAR | Status: DC | PRN
  Administered 2014-10-13: 500 mL

## 2014-10-13 MED ORDER — CARVEDILOL 6.25 MG PO TABS
6.2500 mg | ORAL_TABLET | Freq: Two times a day (BID) | ORAL | Status: DC
  Administered 2014-10-13 – 2014-10-14 (×2): 6.25 mg via ORAL
  Filled 2014-10-13 (×2): qty 1

## 2014-10-13 MED ORDER — ACETAMINOPHEN 325 MG PO TABS: 325.0000 mg | ORAL_TABLET | ORAL | Status: DC | PRN

## 2014-10-13 MED ORDER — SENNOSIDES-DOCUSATE SODIUM 8.6-50 MG PO TABS: 1.0000 | ORAL_TABLET | Freq: Every day | ORAL | Status: DC

## 2014-10-13 MED ORDER — CEFAZOLIN SODIUM-DEXTROSE 2-3 GM-% IV SOLR
INTRAVENOUS | Status: AC
  Filled 2014-10-13: qty 50

## 2014-10-13 MED ORDER — ONDANSETRON HCL 4 MG/2ML IJ SOLN: 4.0000 mg | Freq: Four times a day (QID) | INTRAMUSCULAR | Status: DC | PRN

## 2014-10-13 SURGICAL SUPPLY — 7 items
CABLE SURGICAL S-101-97-12 (CABLE) ×3 IMPLANT
ICD VISIA MRI VR DVFB1D4 (ICD Generator) ×1 IMPLANT
LEAD SPRINT QUAT SEC 6935M-62 (Lead) ×3 IMPLANT
PAD DEFIB LIFELINK (PAD) ×3 IMPLANT
SHEATH CLASSIC 9F (SHEATH) ×3 IMPLANT
TRAY PACEMAKER INSERTION (CUSTOM PROCEDURE TRAY) ×3 IMPLANT
VISIA MRI VR DVFB1D4 (ICD Generator) ×3 IMPLANT

## 2014-10-13 NOTE — Interval H&P Note (Signed)
ICD Criteria  Current LVEF:25%. Within 12 months prior to implant: Yes   Heart failure history: Yes, Class III  Cardiomyopathy history: Yes, Ischemic Cardiomyopathy.  Atrial Fibrillation/Atrial Flutter: No.  Ventricular tachycardia history: Yes, No hemodynamic instability. VT Type: Non-Sustained Ventricular Tachycardia.  Cardiac arrest history: No.  History of syndromes with risk of sudden death: No.  Previous ICD: No.  Current ICD indication: Primary  PPM indication: No.  Beta Blocker therapy for 3 or more months: Yes, prescribed.   Ace Inhibitor/ARB therapy for 3 or more months: Yes, prescribed.   History and Physical Interval Note:  10/13/2014 8:12 AM  Thomas Sosa  has presented today for surgery, with the diagnosis of acm  The various methods of treatment have been discussed with the patient and family. After consideration of risks, benefits and other options for treatment, the patient has consented to  Procedure(s): ICD Implant (N/A) as a surgical intervention .  The patient's history has been reviewed, patient examined, no change in status, stable for surgery.  I have reviewed the patient's chart and labs.  Questions were answered to the patient's satisfaction.     Virl Axe

## 2014-10-13 NOTE — Progress Notes (Signed)
ANTICOAGULATION CONSULT NOTE - Initial Consult  Pharmacy Consult for Coumadin Indication: CVA  No Known Allergies  Patient Measurements: Height: 5\' 11"  (180.3 cm) Weight: 262 lb 11.2 oz (119.16 kg) IBW/kg (Calculated) : 75.3  Vital Signs: Temp: 98.4 F (36.9 C) (07/20 1646) Temp Source: Oral (07/20 1646) BP: 172/94 mmHg (07/20 1646) Pulse Rate: 80 (07/20 1646)  Labs:  Recent Labs  10/12/14 0515 10/13/14 0549  HGB 13.2 13.7  HCT 39.1 40.1  PLT 253 232  CREATININE 1.21 0.93    Estimated Creatinine Clearance: 112.4 mL/min (by C-G formula based on Cr of 0.93).   Medical History: Past Medical History  Diagnosis Date  . Hypertension   . Diabetes mellitus without complication   . Diabetic neuropathy Dx 2011  . Cancer of prostate Dx 2005  . Hyperlipidemia   . Coronary artery disease   . Tobacco abuse   . Peripheral arterial disease   . Stroke      Assessment: 59yo male recently admitted 0/86 for embolic stroke.  Pt returned to ED and admitted again on 7/17 w/ new acute infarcts. CTA head and neck on 7/18 showed atherosclerotic disease w/ stenosis at carotid siphons, vertebral artery, and proximal basilar artery.  TEE on 7/18 found no cardiac source for emboli, negative bubble study, but LVEF 25-30%.  Pt underwent ICD placement 7/20 d/t long-standing ischemic cardiomyopathy, nonsustained Vtach, prior h/o syncope, and now stroke.  Neuro and EP have recommended initiation of Coumadin 4-5 days after ICD placement to allow time for wound healing.  Pharmacy consulted to initiate Coumadin for secondary prevention of CVA.  Baseline INR 1.13, CBC WNL, no bleeding noted since time of surgery earlier today.  Goal of Therapy:  INR 2-3 Monitor platelets by anticoagulation protocol: Yes   Plan:  -Recommend starting Coumadin 10mg  daily 4-5 days after ICD placement per neuro and EP recs (1st dose 7/24) -Discontinue Plavix when INR therapeutic (2-3) -Monitor INR, CBC, s/sx of  bleeding  Drucie Opitz, PharmD Clinical Pharmacy Resident Pager: [phone] 10/13/2014 8:25 PM

## 2014-10-13 NOTE — Discharge Instructions (Signed)
° ° °  Supplemental Discharge Instructions for  Pacemaker/Defibrillator Patients  Activity No heavy lifting or vigorous activity with your left/right arm for 6 to 8 weeks.  Do not raise your left/right arm above your head for one week.  Gradually raise your affected arm as drawn below.           __      10/17/14                    10/18/14                   10/19/14                  10/20/14  NO DRIVING for  1 week     WOUND CARE - Keep the wound area clean and dry.  Do not get this area wet for one week. No showers for one week; you may shower on  10/20/14   . - The tape/steri-strips on your wound will fall off; do not pull them off.  No bandage is needed on the site.  DO  NOT apply any creams, oils, or ointments to the wound area. - If you notice any drainage or discharge from the wound, any swelling or bruising at the site, or you develop a fever > 101? F after you are discharged home, call the office at once.  Special Instructions - You are still able to use cellular telephones; use the ear opposite the side where you have your pacemaker/defibrillator.  Avoid carrying your cellular phone near your device. - When traveling through airports, show security personnel your identification card to avoid being screened in the metal detectors.  Ask the security personnel to use the hand wand. - Avoid arc welding equipment, MRI testing (magnetic resonance imaging), TENS units (transcutaneous nerve stimulators).  Call the office for questions about other devices. - Avoid electrical appliances that are in poor condition or are not properly grounded. - Microwave ovens are safe to be near or to operate.  Additional information for defibrillator patients should your device go off: - If your device goes off ONCE and you feel fine afterward, notify the device clinic nurses. - If your device goes off ONCE and you do not feel well afterward, call 911. - If your device goes off TWICE, call 911. - If your  device goes off THREE times in one day, call 911.  DO NOT DRIVE YOURSELF OR A FAMILY MEMBER WITH A DEFIBRILLATOR TO THE HOSPITAL--CALL 911.

## 2014-10-13 NOTE — Progress Notes (Addendum)
Inpatient Diabetes Program Recommendations  AACE/ADA: New Consensus Statement on Inpatient Glycemic Control (2013)  Target Ranges:  Prepandial:   less than 140 mg/dL      Peak postprandial:   less than 180 mg/dL (1-2 hours)      Critically ill patients:  140 - 180 mg/dL   Meal coverage added yesterday mid-day of 10 units tidwc in addition to moderate scale. Moderate scale at q 4 hrs-The levemir dosage at 35 units bid is also very affective. Pt will need resume home regimen of 35 units levemir bid and meal coverage of 10 units tidwc.  Will verify with patient and assess abiity to continue injections.  Thank you Rosita Kea, RN, MSN, CDE  Diabetes Inpatient Program Office: [phone] Pager: [phone] 8:00 am to 5:00 pm

## 2014-10-13 NOTE — Progress Notes (Signed)
Appointment made with [facility] for Coumadin prescription 10/19/14 at 0915.  A follow up appointment was scheduled as well for 10/20/14 at 1130.  RN aware, will notify MD and patient.

## 2014-10-13 NOTE — Progress Notes (Signed)
[facility] PROGRESS NOTE  Thomas Sosa SAY:[id number] DOB: [birth year] DOA: 10/10/2014 PCP: [name], NP  Assessment/Plan: 1 acute stroke MRI with right MCA territory infarcts in the setting of previous  right occipital infarcts 1 week ago. Likely embolic in nature. CT angiogram head and neck with atherosclerosis disease with stenosis of the carotid siphons, vertebral artery, and proximal basilar artery. 2-D echo which was done during last hospitalization last week at a year for 25-30%. Patient is status post TEE with a EF of 25-30% with diffuse hypokinesis, negative bubble study, no source of emboli. Carotid Dopplers were done during last hospitalization a such will not be repeated. LDL was 100. Hemoglobin A1c is 12.1. Patient currently on Plavix 75 mg daily and aspirin 81 mg daily. -Patient has been seen by cardiology/EP recommended ICD placement due to long-standing ischemic cardiomyopathy, nonsustained V. tach prior history of syncope and now stroke to be able to also monitor for atrial fibrillation.  -Neurology has discussed with EP in terms of anticoagulation and recommended to start Coumadin 4 to 5 days post ICD placement for wound healing. Once INR is at goal between 2 -3, Plavix can be discontinued. Patient has been seen by physical therapy recommended outpatient PT. Neurology following and appreciate input and recommendations.  Will try to arrange follow up with PCP or cardiology to start coumadin prior to discharge  2 hypertension Permissive hypertension secondary to problem #1. Continue lisinopril and metoprolol.  3 poorly controlled diabetes mellitus with peripheral neuropathy Hemoglobin A1c is 12.1. CBGs have ranged from 173-236. Continue current dose of Levemir 35 units twice daily. Continue sliding scale insulin.  NovoLog 10 units 3 times daily.   #4 hyperlipidemia LDL was 100 on 10/05/2014. Goal LDL is less than 70. Continue Lipitor.  #5 chronic systolic heart  failure Stable.  #6 coronary artery disease status post CABG/ischemic cardiomyopathy, nonsustained V. tach, prior history of syncope Stable. Continue aspirin and Plavix. Continue beta blocker and ACE inhibitor. Patient has been seen by EP and recommending ICD implantation which will be done tomorrow. Per neurology and cardiology patient will continue on aspirin and Plavix for at least 4-5 days post ICD placement and then may be started on Coumadin for anticoagulation. Once INR is therapeutic between 2-3 Plavix may be discontinued and Coumadin continued. Cardiology following and appreciate input and recommendations. Follow.  #7 peripheral vascular disease Continue aspirin, Plavix, statin.  #8 prophylaxis PPI for GI prophylaxis. Lovenox for DVT prophylaxis.   Code Status: Full Family Communication: Updated patient. No family at bedside. Disposition Plan: Once ICD has been placed and okay with cardiology for discharge patient may be discharged home on aspirin and Plavix and then started on Coumadin 4-5 days post ICD placement for wound healing or per cardiology recs. Discharge 7-21 if ok with cardiology   Consultants:  Neurology: Dr. Armida Sans 10/10/2014  Cardiology  EP: Dr. Caryl Comes 10/11/2014  Procedures:  TEE Dr. Meda Coffee 10/11/2014  CT angiogram head and neck 10/11/2014  MRI 10/10/2014  Antibiotics:  None  HPI/Subjective: Patient denies shortness of breath. No chest pain. He is hungry, just came from procedure.   Objective: Filed Vitals:   10/13/14 1334  BP: 132/89  Pulse: 68  Temp: 98.6 F (37 C)  Resp: 20    Intake/Output Summary (Last 24 hours) at 10/13/14 1425 Last data filed at 10/12/14 1732  Gross per 24 hour  Intake    240 ml  Output      0 ml  Net  240 ml   Filed Weights   10/10/14 2103  Weight: 119.16 kg (262 lb 11.2 oz)    Exam:   General:  NAD  Cardiovascular: RRR  Respiratory: CTAB  Abdomen: Soft, nontender, nondistended, positive bowel  sounds.  Musculoskeletal: No clubbing cyanosis or edema.   Data Reviewed: Basic Metabolic Panel:  Recent Labs Lab 10/10/14 1625 10/10/14 1653 10/12/14 0515 10/13/14 0549  NA 136 139 135 137  K 4.3 4.3 3.7 3.9  CL 102 100* 100* 101  CO2 27  --  29 28  GLUCOSE 329* 332* 188* 126*  BUN 18 22* 25* 21*  CREATININE 0.95 0.90 1.21 0.93  CALCIUM 9.4  --  8.7* 8.7*   Liver Function Tests:  Recent Labs Lab 10/10/14 1625  AST 18  ALT 22  ALKPHOS 69  BILITOT 0.6  PROT 7.7  ALBUMIN 3.3*   No results for input(s): LIPASE, AMYLASE in the last 168 hours. No results for input(s): AMMONIA in the last 168 hours. CBC:  Recent Labs Lab 10/10/14 1625 10/10/14 1653 10/12/14 0515 10/13/14 0549  WBC 6.9  --  8.2 7.3  NEUTROABS 3.7  --   --   --   HGB 14.7 16.0 13.2 13.7  HCT 42.0 47.0 39.1 40.1  MCV 87.7  --  88.3 88.9  PLT 270  --  253 232   Cardiac Enzymes: No results for input(s): CKTOTAL, CKMB, CKMBINDEX, TROPONINI in the last 168 hours. BNP (last 3 results) No results for input(s): BNP in the last 8760 hours.  ProBNP (last 3 results) No results for input(s): PROBNP in the last 8760 hours.  CBG:  Recent Labs Lab 10/13/14 0456 10/13/14 0640 10/13/14 0813 10/13/14 1009 10/13/14 1143  GLUCAP 132* 97 73 75 93    No results found for this or any previous visit (from the past 240 hour(s)).   Studies: No results found.  Scheduled Meds: . aspirin EC  81 mg Oral Daily  . atorvastatin  80 mg Oral Daily  . carvedilol  6.25 mg Oral BID WC  .  ceFAZolin (ANCEF) IV  2 g Intravenous To Cath  . chlorhexidine  60 mL Topical Once  . clopidogrel  75 mg Oral Daily  . gabapentin  600 mg Oral TID  . gentamicin irrigation  80 mg Irrigation To Cath  . insulin aspart  0-15 Units Subcutaneous 6 times per day  . insulin aspart  10 Units Subcutaneous TID WC  . insulin detemir  35 Units Subcutaneous BID  . lisinopril  20 mg Oral Daily  . pantoprazole (PROTONIX) IV  40 mg  Intravenous QHS  . sertraline  50 mg Oral Daily   Continuous Infusions: . sodium chloride 500 mL (10/11/14 1349)  . sodium chloride    . sodium chloride      Principal Problem:   Acute embolic stroke Active Problems:   DM type 2 (diabetes mellitus, type 2)   HTN (hypertension)   Hyperlipidemia   Coronary artery disease   Peripheral arterial disease   Type 2 diabetes mellitus with peripheral neuropathy   Stroke   Systolic CHF, chronic   Cardiomyopathy, ischemic    Time spent: 25 minutes    Niel Hummer A M.D. [facility] Pager [phone]. If 7PM-7AM, please contact night-coverage at [email or web address], password [facility] 10/13/2014, 2:25 PM  LOS: 3 days

## 2014-10-13 NOTE — Progress Notes (Signed)
Patient went down for placement of defibrillator around 0920.

## 2014-10-13 NOTE — H&P (View-Only) (Signed)
SUBJECTIVE: The patient is doing well today.  At this time, he denies chest pain, shortness of breath, or any new concerns.  Records received from previous hospital - EF has been 30-35% since 2010.   CURRENT MEDICATIONS: . aspirin EC  81 mg Oral Daily  . aspirin  300 mg Rectal Once  . atorvastatin  80 mg Oral Daily  . clopidogrel  75 mg Oral Daily  . enoxaparin (LOVENOX) injection  40 mg Subcutaneous Q24H  . gabapentin  600 mg Oral TID  . insulin aspart  0-15 Units Subcutaneous 6 times per day  . insulin aspart  10 Units Subcutaneous TID WC  . insulin detemir  35 Units Subcutaneous BID  . lisinopril  20 mg Oral Daily  . metoprolol tartrate  12.5 mg Oral Daily  . pantoprazole (PROTONIX) IV  40 mg Intravenous QHS  . sertraline  50 mg Oral Daily   . sodium chloride 500 mL (10/11/14 1349)    OBJECTIVE: Physical Exam: Filed Vitals:   10/11/14 1709 10/11/14 2048 10/12/14 0248 10/12/14 0526  BP: 138/64 145/88 90/54 119/65  Pulse: 65 80 81 67  Temp: 97.6 F (36.4 C) 98.8 F (37.1 C) 98.6 F (37 C) 98.4 F (36.9 C)  TempSrc: Oral Oral Oral Oral  Resp: 20 18 18 18   Height:      Weight:      SpO2: 100% 99% 99% 100%    Intake/Output Summary (Last 24 hours) at 10/12/14 0905 Last data filed at 10/11/14 1645  Gross per 24 hour  Intake    240 ml  Output      0 ml  Net    240 ml    Telemetry reveals sinus rhythm with occasional PVC's  GEN- The patient is well appearing, alert and oriented x 3 today.   Head- normocephalic, atraumatic Eyes-  Sclera clear, conjunctiva pink Ears- hearing intact Oropharynx- clear Neck- supple  Lungs- Clear to ausculation bilaterally, normal work of breathing Heart- Regular rate and rhythm  GI- soft, NT, ND, + BS Extremities- no clubbing, cyanosis, or edema Skin- no rash or lesion Psych- euthymic mood, full affect Neuro- strength and sensation are intact  LABS: Basic Metabolic Panel:  Recent Labs  10/10/14 1625 10/10/14 1653  10/12/14 0515  NA 136 139 135  K 4.3 4.3 3.7  CL 102 100* 100*  CO2 27  --  29  GLUCOSE 329* 332* 188*  BUN 18 22* 25*  CREATININE 0.95 0.90 1.21  CALCIUM 9.4  --  8.7*   Liver Function Tests:  Recent Labs  10/10/14 1625  AST 18  ALT 22  ALKPHOS 69  BILITOT 0.6  PROT 7.7  ALBUMIN 3.3*    CBC:  Recent Labs  10/10/14 1625 10/10/14 1653 10/12/14 0515  WBC 6.9  --  8.2  NEUTROABS 3.7  --   --   HGB 14.7 16.0 13.2  HCT 42.0 47.0 39.1  MCV 87.7  --  88.3  PLT 270  --  253    RADIOLOGY: Ct Angio Head W/cm &/or Wo Cm 10/11/2014   CLINICAL DATA:  New onset left-sided weakness. New acute infarctions in the right hemisphere.  EXAM: CT ANGIOGRAPHY HEAD AND NECK  TECHNIQUE: Multidetector CT imaging of the head and neck was performed using the standard protocol during bolus administration of intravenous contrast. Multiplanar CT image reconstructions and MIPs were obtained to evaluate the vascular anatomy. Carotid stenosis measurements (when applicable) are obtained utilizing NASCET criteria, using the distal  internal carotid diameter as the denominator.  CONTRAST:  199mL OMNIPAQUE IOHEXOL 350 MG/ML SOLN  COMPARISON:  MRI 10/10/2014  FINDINGS: CT HEAD  The brain shows generalized atrophy. There chronic small-vessel ischemic changes throughout the white matter. Sub cm acute infarctions in the right hemisphere in a watershed distribution are evident. No evidence of mass effect or hemorrhage. No hydrocephalus or extra-axial collection.  CTA NECK  Aortic arch: There is atherosclerosis of the aorta but no aneurysm or dissection. Branching pattern of the brachiocephalic vessel origins from the arch is normal without origin stenosis.  Right carotid system: Common carotid artery shows mild atherosclerotic irregularity but no stenosis. At the carotid bifurcation, there is atherosclerotic plaque. Maximal stenosis of the proximal ICA is only 20%. There is mild irregularity in the ICA bulb. Cervical ICA  beyond the bulb is widely patent.  Left carotid system: Common carotid artery widely patent to the bifurcation region with mild atherosclerotic irregularity. Atherosclerotic disease at the carotid bifurcation with 20% stenosis of the proximal ICA. Beyond that, the internal carotid artery is widely patent through the cervical region, though quite tortuous beneath the skullbase.  Vertebral arteries:50% stenosis at the nondominant right vertebral artery origin. No stenosis of the dominant left vertebral artery origin. Both vessels are patent through the cervical region.  Skeleton: Moderate cervical spondylosis  Other neck: No mass or lymphadenopathy.  CTA HEAD  Anterior circulation: Both internal carotid arteries are patent through the skullbase. There is extensive atherosclerotic calcification in the siphon regions. Stenosis is estimated at 50-70% in the siphon regions bilaterally. Beyond that, the anterior and middle cerebral vessels are patent without proximal stenosis. No aneurysm or vascular malformation. No definite missing distal branches.  Posterior circulation: Both vertebral arteries are patent at the foramen magnum. There is calcified plaque affecting the distal vertebral arteries bilaterally. Both vertebral arteries are patent to the basilar. On the right, there are serial stenoses measuring 50-70%. On the left, there is stenosis of 50%. There is atherosclerotic disease of the basilar artery with proximal basilar stenosis of 50%. Distally, the vessel is widely patent. Major posterior circulation branch vessels appear patent.  Venous sinuses: Patent and normal  Anatomic variants: None significant  Delayed phase: Low level enhancement in the right parietal cortical and subcortical brain related to subacute infarction in those locations.  IMPRESSION: Atherosclerotic disease at both carotid bifurcations but no stenosis greater than 20% on either side.  Severe intracranial atherosclerotic disease affecting the  carotid siphon regions. Serial 50-70% stenoses in the carotid siphon regions could be hemodynamically significant. No focal proximal anterior or middle cerebral artery finding.  50% stenosis of the right vertebral artery origin. No stenosis of the dominant left vertebral artery. Advanced atherosclerotic disease of both vertebral arteries in the intracranial portion with serial stenoses of 50-70%. 50% stenosis of the proximal basilar artery.   Electronically Signed   By: Nelson Chimes M.D.   On: 10/11/2014 12:54   Dg Chest 2 View 10/04/2014   CLINICAL DATA:  Shortness of breath and cough  EXAM: CHEST - 2 VIEW  COMPARISON:  None.  FINDINGS: Postsurgical changes are noted. The cardiac shadow is within normal limits. The lungs are clear bilaterally. No acute bony abnormality is noted.  IMPRESSION: No active disease.   Electronically Signed   By: Inez Catalina M.D.   On: 10/04/2014 13:03   ASSESSMENT AND PLAN:  Principal Problem:   Acute embolic stroke Active Problems:   DM type 2 (diabetes mellitus, type 2)  HTN (hypertension)   Hyperlipidemia   Coronary artery disease   Peripheral arterial disease   Type 2 diabetes mellitus with peripheral neuropathy   Stroke   Systolic CHF, chronic  1.  Ischemic cardiomyopathy, non-sustained VT, prior syncope The patient has a long standing ischemic cardiomyopathy (at least since 2010) despite guideline directed therapy.  He has also had NSVT on telemetry as well as prior syncope.  ICD implantation is recommended.  Risks, benefits discussed with the patient today who wishes to proceed.  Will plan ICD implant at the next available time.  With stroke this admission, would either need Visia AF single chamber device or dual chamber ICD to look for atrial fibrillation.   2.  HTN Stable No change required today  3.  CAD s/p CABG No recent ischemic symptoms Continue medical therapy  4.  Chronic systolic heart failure Euvolemic on exam today Continue medical  therapy  Chanetta Marshall, NP 10/12/2014 9:08 AM  Pt seen and issues reviewed  Have reviewed the potential benefits and risks of ICD implantation including but not limited to death, perforation of heart or lung, lead dislodgement, infection,  device malfunction and inappropriate shocks.  The patient express understanding  and are willing to proceed.    The recommendation from neurology is to use coumadin for secondary prevention based on cardiomyopathy  Hence we will proceed with single chamber ICD as afib issue no longer as relevant  Chanbe metop >> carvedilol based on COMET and will consider bidil  On statins 3

## 2014-10-14 ENCOUNTER — Inpatient Hospital Stay (HOSPITAL_COMMUNITY): Payer: Medicare HMO

## 2014-10-14 ENCOUNTER — Encounter (HOSPITAL_COMMUNITY): Payer: Self-pay | Admitting: Cardiology

## 2014-10-14 ENCOUNTER — Other Ambulatory Visit: Payer: Self-pay

## 2014-10-14 DIAGNOSIS — I255 Ischemic cardiomyopathy: Secondary | ICD-10-CM

## 2014-10-14 DIAGNOSIS — I6340 Cerebral infarction due to embolism of unspecified cerebral artery: Principal | ICD-10-CM

## 2014-10-14 LAB — GLUCOSE, CAPILLARY
Glucose-Capillary: 111 mg/dL — ABNORMAL HIGH (ref 65–99)
Glucose-Capillary: 107 mg/dL — ABNORMAL HIGH (ref 65–99)
Glucose-Capillary: 205 mg/dL — ABNORMAL HIGH (ref 65–99)

## 2014-10-14 MED ORDER — INSULIN DETEMIR 100 UNIT/ML ~~LOC~~ SOLN: 35.0000 [IU] | Freq: Two times a day (BID) | SUBCUTANEOUS | Status: DC

## 2014-10-14 MED ORDER — INSULIN ASPART 100 UNIT/ML ~~LOC~~ SOLN: 10.0000 [IU] | Freq: Three times a day (TID) | SUBCUTANEOUS | Status: DC

## 2014-10-14 MED ORDER — POLYETHYLENE GLYCOL 3350 17 G PO PACK: 17.0000 g | PACK | Freq: Two times a day (BID) | ORAL | Status: DC

## 2014-10-14 MED ORDER — CARVEDILOL 6.25 MG PO TABS: 6.2500 mg | ORAL_TABLET | Freq: Two times a day (BID) | ORAL | Status: DC

## 2014-10-14 MED ORDER — ASPIRIN 81 MG PO TBEC: 81.0000 mg | DELAYED_RELEASE_TABLET | Freq: Every day | ORAL | Status: DC

## 2014-10-14 MED ORDER — TRAMADOL HCL 50 MG PO TABS: 100.0000 mg | ORAL_TABLET | Freq: Four times a day (QID) | ORAL | Status: DC | PRN

## 2014-10-14 MED ORDER — ATORVASTATIN CALCIUM 80 MG PO TABS: 80.0000 mg | ORAL_TABLET | Freq: Every day | ORAL | Status: DC

## 2014-10-14 NOTE — Progress Notes (Signed)
Patient being discharged home, cardiac consult signed off on new ICD, pain controled and vital signs within specified range, IV removed, discharge summary reviewed and signed with follow up appointment explained. Nephew is transporting him home and will monitor him there.

## 2014-10-14 NOTE — Discharge Summary (Signed)
Physician Discharge Summary  Thomas Sosa ZOX:[id number] DOB: [birth year] DOA: 10/10/2014  PCP: [name], NP  Admit date: 10/10/2014 Discharge date: 10/14/2014  Time spent: 35 minutes  Recommendations for Outpatient Follow-up:  Needs to follow up with coumadin clinic to start coumadin. When INR is at goal please discontinue plavix.  Adjust BP medication and insulin as needed.   Discharge Diagnoses:    Acute embolic stroke   DM type 2 (diabetes mellitus, type 2)   HTN (hypertension)   Hyperlipidemia   Coronary artery disease   Peripheral arterial disease   Type 2 diabetes mellitus with peripheral neuropathy   Stroke   Systolic CHF, chronic   Cardiomyopathy, ischemic   Discharge Condition: stable.   Diet recommendation: Heart Healthy  Filed Weights   10/10/14 2103  Weight: 119.16 kg (262 lb 11.2 oz)    History of present illness:  HPI: Thomas Sosa is a 59 y.o. 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 He said the weakness disappeared in 30 min but the numbness is persistent. He also had slurred speech and dysphagia that improved too. He denied facial droop but his left Furnish is numb. No ocular symptoms or double vision. He is complaint with his medication.   Hospital Course:  1 acute stroke MRI with right MCA territory infarcts in the setting of previous right occipital infarcts 1 week ago. Likely embolic in nature. CT angiogram head and neck with atherosclerosis disease with stenosis of the carotid siphons, vertebral artery, and proximal basilar artery. 2-D echo which was done during last hospitalization last week at a year for 25-30%. Patient is status post TEE with a EF of 25-30% with diffuse hypokinesis, negative  bubble study, no source of emboli. Carotid Dopplers were done during last hospitalization a such will not be repeated. LDL was 100. Hemoglobin A1c is 12.1. Patient currently on Plavix 75 mg daily and aspirin 81 mg daily. -Patient has been seen by cardiology/EP recommended ICD placement due to long-standing ischemic cardiomyopathy, nonsustained V. tach prior history of syncope and now stroke to be able to also monitor for atrial fibrillation.  -Neurology has discussed with EP in terms of anticoagulation and recommended to start Coumadin 4 to 5 days post ICD placement for wound healing. Once INR is at goal between 2 -3, Plavix can be discontinued. Patient has been seen by physical therapy recommended outpatient PT. Neurology following and appreciate input and recommendations.  Patient has appointment at coumadin clinic to start coumadin  2 hypertension Permissive hypertension secondary to problem #1. Continue lisinopril and metoprolol.  3 poorly controlled diabetes mellitus with peripheral neuropathy Hemoglobin A1c is 12.1. CBGs have ranged from 173-236. Continue current dose of Levemir 35 units twice daily. Continue sliding scale insulin. NovoLog 10 units 3 times daily.   #4 hyperlipidemia LDL was 100 on 10/05/2014. Goal LDL is less than 70. Continue Lipitor.  #5 chronic systolic heart failure Stable.  #6 coronary artery disease status post CABG/ischemic cardiomyopathy, nonsustained V. tach, prior history of syncope Stable. Continue aspirin and Plavix. Continue beta blocker and ACE inhibitor. Patient has been seen by EP and recommending ICD implantation which will be done tomorrow. Per neurology and cardiology patient will continue on aspirin and Plavix for at least  4-5 days post ICD placement and then may be started on Coumadin for anticoagulation. Once INR is therapeutic between 2-3 Plavix may be discontinued and Coumadin continued. Cardiology following and appreciate input and recommendations.  Follow.  #7 peripheral vascular disease Continue aspirin, Plavix, statin.  #8 prophylaxis PPI for GI prophylaxis. Lovenox for DVT prophylaxis.  Procedures:  ICD implantation  Consultations:  EP  Neurology  Discharge Exam: Filed Vitals:   10/14/14 0500  BP: 113/57  Pulse:   Temp: 98.2 F (36.8 C)  Resp: 17    General: Alert in no distress.  Cardiovascular: S 1, S 2 RRR Respiratory: CTA  Discharge Instructions   Discharge Instructions    Ambulatory referral to Neurology    Complete by:  As directed   Dr. Erlinda Hong requests followup in 2 months     Diet Carb Modified    Complete by:  As directed      Increase activity slowly    Complete by:  As directed           Current Discharge Medication List    START taking these medications   Details  aspirin EC 81 MG EC tablet Take 1 tablet (81 mg total) by mouth daily. Qty: 30 tablet, Refills: 0    carvedilol (COREG) 6.25 MG tablet Take 1 tablet (6.25 mg total) by mouth 2 (two) times daily with a meal. Qty: 60 tablet, Refills: 0    polyethylene glycol (MIRALAX / GLYCOLAX) packet Take 17 g by mouth 2 (two) times daily. Qty: 14 each, Refills: 0      CONTINUE these medications which have CHANGED   Details  atorvastatin (LIPITOR) 80 MG tablet Take 1 tablet (80 mg total) by mouth daily. Qty: 30 tablet, Refills: 0    insulin detemir (LEVEMIR) 100 UNIT/ML injection Inject 0.35 mLs (35 Units total) into the skin 2 (two) times daily. Qty: 10 mL, Refills: 11    traMADol (ULTRAM) 50 MG tablet Take 2 tablets (100 mg total) by mouth every 6 (six) hours as needed for moderate pain. Qty: 30 tablet, Refills: 0      CONTINUE these medications which have NOT CHANGED   Details  gabapentin (NEURONTIN) 600 MG tablet take 1 and 1/2 tablets by mouth three times a day Qty: 135 tablet, Refills: 1   Associated Diagnoses: Diabetic peripheral neuropathy    glipiZIDE (GLUCOTROL XL) 5 MG 24 hr tablet Take 1 tablet (5 mg total) by mouth  daily with breakfast. Qty: 30 tablet, Refills: 2    insulin aspart (NOVOLOG) 100 UNIT/ML injection Inject 14 Units into the skin 3 (three) times daily before meals. < 400 = 14 units; > 400 = call MD    lisinopril (PRINIVIL,ZESTRIL) 20 MG tablet Take 1 tablet (20 mg total) by mouth daily. Blood pressure Qty: 30 tablet, Refills: 4   Associated Diagnoses: Essential hypertension    omeprazole (PRILOSEC) 20 MG capsule Take 1 capsule (20 mg total) by mouth daily. Acid reflux Qty: 30 capsule, Refills: 4   Associated Diagnoses: Gastroesophageal reflux disease, esophagitis presence not specified    sertraline (ZOLOFT) 50 MG tablet Take 1 tablet (50 mg total) by mouth daily. For depression Qty: 30 tablet, Refills: 3   Associated Diagnoses: Depression    clopidogrel (PLAVIX) 75 MG tablet Take 1 tablet (75 mg total) by mouth daily. Qty: 30 tablet, Refills: 2    ONE TOUCH ULTRA TEST test strip TEST four times a day Qty: 100 each, Refills: 6  STOP taking these medications     ibuprofen (ADVIL,MOTRIN) 200 MG tablet      metoprolol tartrate (LOPRESSOR) 25 MG tablet        No Known Allergies Follow-up Information    Follow up with [facility] On 10/21/2014.   Specialty:  Cardiology   Why:  at 1200 noon for wound check   Contact information:   [address] [place] [phone]      Follow up with Murray Hodgkins, NP On 10/27/2014.   Specialties:  Nurse Practitioner, Cardiology, Radiology   Why:  at 1:30 pm for follow up with Dr. Kennon Holter NP.   Contact information:   [address] [address] [phone]       Follow up with Xu,Jindong, MD In 2 months.   Specialty:  Neurology   Why:  Stroke Clinic, Office will call you with appointment date & time   Contact information:   [address] [place] [address] [phone]       Follow up with [facility].   Specialty:  Rehabilitation   Why:  Office will call with appointment date and time   Contact information:   [address] [place] [id number] [place] [phone]      Follow up with [name], NP. Go on 10/20/2014.   Specialty:  Internal Medicine   Why:  at 1130 for Follow up appointment   Contact information:   [place] [address] [phone]       Follow up with [name], NP On 10/19/2014.   Specialty:  Internal Medicine   Why:  [phone] for regular follow up   Contact information:   [place] [address] [phone]       Follow up with [facility] On 10/15/2014.   Why:  at 10:30AM to start Coumadin   Contact information:   [address] [place]  [phone]        The results of significant diagnostics from this hospitalization (including imaging, microbiology, ancillary and laboratory) are listed below for reference.    Significant Diagnostic Studies: Ct Angio Head W/cm &/or Wo Cm  10/11/2014   CLINICAL DATA:  New onset left-sided weakness. New acute infarctions in the right hemisphere.  EXAM: CT ANGIOGRAPHY HEAD AND NECK  TECHNIQUE: Multidetector CT imaging of the head and neck was performed using the standard protocol during bolus administration of intravenous contrast. Multiplanar CT image reconstructions and MIPs were obtained to evaluate the vascular anatomy. Carotid stenosis measurements (when applicable) are obtained utilizing NASCET criteria, using the distal internal carotid diameter as the denominator.  CONTRAST:  120mL OMNIPAQUE IOHEXOL 350 MG/ML SOLN  COMPARISON:  MRI 10/10/2014  FINDINGS: CT HEAD  The brain shows generalized atrophy. There chronic small-vessel ischemic changes throughout the white matter. Sub cm acute infarctions in the right hemisphere in a watershed distribution are evident. No evidence of mass  effect or hemorrhage. No hydrocephalus or extra-axial collection.  CTA NECK  Aortic arch: There is atherosclerosis of the aorta but no aneurysm or dissection. Branching pattern of the brachiocephalic vessel origins from the arch is normal without origin stenosis.  Right carotid system: Common carotid artery shows mild atherosclerotic irregularity but no stenosis. At the carotid bifurcation, there is atherosclerotic plaque. Maximal stenosis of the proximal ICA is only 20%. There is  mild irregularity in the ICA bulb. Cervical ICA beyond the bulb is widely patent.  Left carotid system: Common carotid artery widely patent to the bifurcation region with mild atherosclerotic irregularity. Atherosclerotic disease at the carotid bifurcation with 20% stenosis of the proximal ICA. Beyond that, the internal carotid artery is widely patent through the cervical region, though quite tortuous beneath the skullbase.  Vertebral arteries:50% stenosis at the nondominant right vertebral artery origin. No stenosis of the dominant left vertebral artery origin. Both vessels are patent through the cervical region.  Skeleton: Moderate cervical spondylosis  Other neck: No mass or lymphadenopathy.  CTA HEAD  Anterior circulation: Both internal carotid arteries are patent through the skullbase. There is extensive atherosclerotic calcification in the siphon regions. Stenosis is estimated at 50-70% in the siphon regions bilaterally. Beyond that, the anterior and middle cerebral vessels are patent without proximal stenosis. No aneurysm or vascular malformation. No definite missing distal branches.  Posterior circulation: Both vertebral arteries are patent at the foramen magnum. There is calcified plaque affecting the distal vertebral arteries bilaterally. Both vertebral arteries are patent to the basilar. On the right, there are serial stenoses measuring 50-70%. On the left, there is stenosis of 50%. There is atherosclerotic disease of the  basilar artery with proximal basilar stenosis of 50%. Distally, the vessel is widely patent. Major posterior circulation branch vessels appear patent.  Venous sinuses: Patent and normal  Anatomic variants: None significant  Delayed phase: Low level enhancement in the right parietal cortical and subcortical brain related to subacute infarction in those locations.  IMPRESSION: Atherosclerotic disease at both carotid bifurcations but no stenosis greater than 20% on either side.  Severe intracranial atherosclerotic disease affecting the carotid siphon regions. Serial 50-70% stenoses in the carotid siphon regions could be hemodynamically significant. No focal proximal anterior or middle cerebral artery finding.  50% stenosis of the right vertebral artery origin. No stenosis of the dominant left vertebral artery. Advanced atherosclerotic disease of both vertebral arteries in the intracranial portion with serial stenoses of 50-70%. 50% stenosis of the proximal basilar artery.   Electronically Signed   By: Nelson Chimes M.D.   On: 10/11/2014 12:54   Dg Chest 2 View  10/14/2014   CLINICAL DATA:  Cardiac pacer.  EXAM: CHEST  2 VIEW  COMPARISON:  CT 10/04/2014, chest x-ray 10/04/2014.  FINDINGS: Cardiac pacer noted with tip in the right ventricle. Prior median sternotomy. Stable cardiomegaly. No pulmonary venous congestion. No focal infiltrate. No pleural effusion or pneumothorax.  IMPRESSION: 1. Interim placement of cardiac pacer, its tip is projected over the right ventricle. No complicating features.  2. Prior CABG. Stable cardiomegaly. No pulmonary venous congestion.   Electronically Signed   By: Marcello Moores  Register   On: 10/14/2014 07:23   Dg Chest 2 View  10/04/2014   CLINICAL DATA:  Shortness of breath and cough  EXAM: CHEST - 2 VIEW  COMPARISON:  None.  FINDINGS: Postsurgical changes are noted. The cardiac shadow is within normal limits. The lungs are clear bilaterally. No acute bony abnormality is noted.   IMPRESSION: No active disease.   Electronically Signed   By: Inez Catalina M.D.   On: 10/04/2014 13:03   Ct Angio Neck W/cm &/or Wo/cm  10/11/2014   CLINICAL DATA:  New onset left-sided weakness. New acute infarctions in the right hemisphere.  EXAM: CT ANGIOGRAPHY HEAD AND NECK  TECHNIQUE: Multidetector CT imaging of the head and neck was performed using the standard protocol during bolus administration of intravenous  contrast. Multiplanar CT image reconstructions and MIPs were obtained to evaluate the vascular anatomy. Carotid stenosis measurements (when applicable) are obtained utilizing NASCET criteria, using the distal internal carotid diameter as the denominator.  CONTRAST:  129mL OMNIPAQUE IOHEXOL 350 MG/ML SOLN  COMPARISON:  MRI 10/10/2014  FINDINGS: CT HEAD  The brain shows generalized atrophy. There chronic small-vessel ischemic changes throughout the white matter. Sub cm acute infarctions in the right hemisphere in a watershed distribution are evident. No evidence of mass effect or hemorrhage. No hydrocephalus or extra-axial collection.  CTA NECK  Aortic arch: There is atherosclerosis of the aorta but no aneurysm or dissection. Branching pattern of the brachiocephalic vessel origins from the arch is normal without origin stenosis.  Right carotid system: Common carotid artery shows mild atherosclerotic irregularity but no stenosis. At the carotid bifurcation, there is atherosclerotic plaque. Maximal stenosis of the proximal ICA is only 20%. There is mild irregularity in the ICA bulb. Cervical ICA beyond the bulb is widely patent.  Left carotid system: Common carotid artery widely patent to the bifurcation region with mild atherosclerotic irregularity. Atherosclerotic disease at the carotid bifurcation with 20% stenosis of the proximal ICA. Beyond that, the internal carotid artery is widely patent through the cervical region, though quite tortuous beneath the skullbase.  Vertebral arteries:50% stenosis at  the nondominant right vertebral artery origin. No stenosis of the dominant left vertebral artery origin. Both vessels are patent through the cervical region.  Skeleton: Moderate cervical spondylosis  Other neck: No mass or lymphadenopathy.  CTA HEAD  Anterior circulation: Both internal carotid arteries are patent through the skullbase. There is extensive atherosclerotic calcification in the siphon regions. Stenosis is estimated at 50-70% in the siphon regions bilaterally. Beyond that, the anterior and middle cerebral vessels are patent without proximal stenosis. No aneurysm or vascular malformation. No definite missing distal branches.  Posterior circulation: Both vertebral arteries are patent at the foramen magnum. There is calcified plaque affecting the distal vertebral arteries bilaterally. Both vertebral arteries are patent to the basilar. On the right, there are serial stenoses measuring 50-70%. On the left, there is stenosis of 50%. There is atherosclerotic disease of the basilar artery with proximal basilar stenosis of 50%. Distally, the vessel is widely patent. Major posterior circulation branch vessels appear patent.  Venous sinuses: Patent and normal  Anatomic variants: None significant  Delayed phase: Low level enhancement in the right parietal cortical and subcortical brain related to subacute infarction in those locations.  IMPRESSION: Atherosclerotic disease at both carotid bifurcations but no stenosis greater than 20% on either side.  Severe intracranial atherosclerotic disease affecting the carotid siphon regions. Serial 50-70% stenoses in the carotid siphon regions could be hemodynamically significant. No focal proximal anterior or middle cerebral artery finding.  50% stenosis of the right vertebral artery origin. No stenosis of the dominant left vertebral artery. Advanced atherosclerotic disease of both vertebral arteries in the intracranial portion with serial stenoses of 50-70%. 50% stenosis of  the proximal basilar artery.   Electronically Signed   By: Nelson Chimes M.D.   On: 10/11/2014 12:54   Mr Brain Wo Contrast  10/10/2014   CLINICAL DATA:  Left-sided arm numbness, onset today. Recent stroke.  EXAM: MRI HEAD WITHOUT CONTRAST  TECHNIQUE: Multiplanar, multiecho pulse sequences of the brain and surrounding structures were obtained without intravenous contrast.  COMPARISON:  10/04/2014  FINDINGS: Calvarium and upper cervical spine: No focal marrow signal abnormality.  Orbits: No significant findings.  Sinuses and Mastoids: Moderate scattered inflammatory  mucosal thickening in the paranasal sinuses. Mastoid and middle ears are clear.  Brain: In addition to the acute infarcts seen 10/04/2014, there are multiple additional acute infarcts in the same general distribution, along the high right frontal parietal convexity, right corona radiata, and in the cortical and subcortical right occipital lobe. The Pattern is best ascribed to MCA and MCA watershed. No infratentorial or contralateral infarcts. Minimal petechial hemorrhage in the high right frontal parietal cortex is stable from previous. Noted negative carotid Doppler, CTA may be useful to evaluate for ulcerated plaque or intracranial stenosis.  Moderate white matter disease, mainly periventricular and deep, likely chronic small vessel disease. Remote lacunar infarct noted in the right periatrial white matter.  No evidence of major vessel occlusion, hydrocephalus, or mass lesion.  IMPRESSION: Since 10/04/2014, increased number of acute infarcts in the right cerebral hemisphere, MCA and border zone arterial distribution. Petechial hemorrhage is minimal and stable from 10/04/2014.   Electronically Signed   By: Monte Fantasia M.D.   On: 10/10/2014 18:09   Mr Brain Wo Contrast  10/04/2014   CLINICAL DATA:  59 year old male with left side numbness and weakness for 2 days. Unresolved symptoms. Initial encounter.  EXAM: MRI HEAD WITHOUT CONTRAST  TECHNIQUE:  Multiplanar, multiecho pulse sequences of the brain and surrounding structures were obtained without intravenous contrast.  COMPARISON:  None.  FINDINGS: Clustered small foci of abnormal trace diffusion in the right peri-Rolandic cortex and subcortical white matter near the right motor strip (series 9, image 13). The most confluent foci do appear mildly restricted on ADC (series 300, image 38). Mild T2 and FLAIR hyperintensity associated. There is evidence of associated petechial hemorrhage (series 7, image 86), but no mass effect.  There are also several small foci of similar abnormal trace diffusion in the right occipital pole involving both cortex and white matter (series 3 images 19-22).  No posterior fossa or contralateral left hemisphere diffusion abnormality.  Major intracranial vascular flow voids are preserved, there is a mild degree of intracranial artery dolichoectasia.  Patchy bilateral cerebral white matter T2 and FLAIR hyper intensity, with areas which most resemble small chronic white matter lacunar infarcts (series 6, image 17). No definite cortical encephalomalacia. Occasional chronic micro hemorrhages in the brain suggested on susceptibility weighted imaging. Deep gray matter nuclei are within normal limits. Mild nonspecific patchy T2 hyperintensity in the pons. Small chronic right cerebellar lacunar infarct on series 5, image 6.  No midline shift, mass effect, evidence of mass lesion, ventriculomegaly, extra-axial collection or acute intracranial hemorrhage. Cervicomedullary junction and pituitary are within normal limits. Visible internal auditory structures appear normal. Mastoids are clear. Moderate paranasal sinus mucosal thickening. Visualized orbit soft tissues are within normal limits. Scalp soft tissues are within normal limits. Visualized bone marrow signal is within normal limits. Negative for age visualized cervical spine.  IMPRESSION: 1. Small subacute appearing infarcts in the right  peri-Rolandic cortex and subcortical white matter. Similar small infarcts in the right occipital pole. Minimal petechial hemorrhage with no mass effect. 2. Evidence of underlying chronic small vessel ischemia in both cerebral hemispheres, and the right cerebellum.   Electronically Signed   By: Genevie Ann M.D.   On: 10/04/2014 18:18   Ct Angio Chest Aorta W/cm &/or Wo/cm  10/04/2014   CLINICAL DATA:  59 year old male with left-sided chest pain and tingling  EXAM: CT ANGIOGRAPHY CHEST WITH CONTRAST  TECHNIQUE: Multidetector CT imaging of the chest was performed using the standard protocol during bolus administration of intravenous contrast.  Multiplanar CT image reconstructions and MIPs were obtained to evaluate the vascular anatomy.  CONTRAST:  14mL OMNIPAQUE IOHEXOL 350 MG/ML SOLN  COMPARISON:  Chest x-ray obtained earlier today  FINDINGS: Mediastinum: Unremarkable CT appearance of the thyroid gland. No suspicious mediastinal or hilar adenopathy. No soft tissue mediastinal mass. The thoracic esophagus is unremarkable.  Heart/Vascular: On the initial unenhanced images, there is no evidence of crescentic high attenuation in the vascular media to suggest acute intramural hematoma. Following a opacification with intravenous contrast, there is no evidence of dissection or aneurysm. Conventional 3 vessel arch anatomy. Scant atherosclerotic vascular calcifications without evidence of stenosis. Normal caliber main and central pulmonary arteries without evidence of central PE. There is cardiomegaly with left ventricular dilatation. Extensive calcifications are present throughout the coronary arteries. Post surgical changes suggest prior 2 vessel CABG. Aortic to distal RCA graft opacifies with contrast material as does the aorta to mid to distal LAD.  Lungs/Pleura: Trace dependent atelectasis. Otherwise, the lungs are clear.  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 No acute fracture or aggressive appearing lytic or blastic osseous lesion. Multilevel degenerative disc disease.  Upper Abdomen: Visualized upper abdominal organs are unremarkable.  Review of the MIP images confirms the above findings.  IMPRESSION: 1. No evidence of acute aortic dissection, aneurysm or other acute vascular abnormality. 2. Cardiomegaly with left ventricular dilatation. 3. Advanced calcification of the coronary arteries. Post surgical changes of prior multivessel CABG. Although evaluation is limited by non cardiac gated technique, the proximal aspect of the bypass grafts opacify with contrast material suggesting patency.   Electronically Signed   By: Jacqulynn Cadet M.D.   On: 10/04/2014 15:04    Microbiology: No results found for this or any previous visit (from the past 240 hour(s)).   Labs: Basic Metabolic Panel:  Recent Labs Lab 10/10/14 1625 10/10/14 1653 10/12/14 0515 10/13/14 0549  NA 136 139 135 137  K 4.3 4.3 3.7 3.9  CL 102 100* 100* 101  CO2 27  --  29 28  GLUCOSE 329* 332* 188* 126*  BUN 18 22* 25* 21*  CREATININE 0.95 0.90 1.21 0.93  CALCIUM 9.4  --  8.7* 8.7*   Liver Function Tests:  Recent Labs Lab 10/10/14 1625  AST 18  ALT 22  ALKPHOS 69  BILITOT 0.6  PROT 7.7  ALBUMIN 3.3*   No results for input(s): LIPASE, AMYLASE in the last 168 hours. No results for input(s): AMMONIA in the last 168 hours. CBC:  Recent Labs Lab 10/10/14 1625 10/10/14 1653 10/12/14 0515 10/13/14 0549  WBC 6.9  --  8.2 7.3  NEUTROABS 3.7  --   --   --   HGB 14.7 16.0 13.2 13.7  HCT 42.0 47.0 39.1 40.1  MCV 87.7  --  88.3 88.9  PLT 270  --  253 232   Cardiac Enzymes: No results for input(s): CKTOTAL, CKMB, CKMBINDEX, TROPONINI in the last 168 hours. BNP: BNP (last 3 results) No results for input(s): BNP in the last 8760 hours.  ProBNP (last 3 results) No results for input(s): PROBNP in the last 8760  hours.  CBG:  Recent Labs Lab 10/13/14 1607 10/13/14 2029 10/13/14 2345 10/14/14 0355 10/14/14 0639  GLUCAP 227* 203* 149* 111* 107*       Signed:  Jeriel Vivanco A  [facility] 10/14/2014, 9:54 AM

## 2014-10-14 NOTE — Progress Notes (Signed)
Spoke briefly with patient regarding diabetes management.  He states that he uses insulin pens at home and has been on insulin for 2 years or more.  A1C is high.  He will need follow up with PCP.  Bloods sugars improved in the hospital.    No further recs. At this time.    Thanks, Adah Perl, RN, BC-ADM Inpatient Diabetes Coordinator Pager [phone] (8a-5p)

## 2014-10-14 NOTE — Progress Notes (Signed)
SUBJECTIVE: The patient is doing well today.  At this time, he denies chest pain, shortness of breath, or any new concerns.    CURRENT MEDICATIONS: . aspirin EC  81 mg Oral Daily  . atorvastatin  80 mg Oral Daily  . carvedilol  6.25 mg Oral BID WC  .  ceFAZolin (ANCEF) IV  1 g Intravenous Q6H  . clopidogrel  75 mg Oral Daily  . gabapentin  600 mg Oral TID  . insulin aspart  0-15 Units Subcutaneous 6 times per day  . insulin aspart  10 Units Subcutaneous TID WC  . insulin detemir  35 Units Subcutaneous BID  . lisinopril  20 mg Oral Daily  . pantoprazole (PROTONIX) IV  40 mg Intravenous QHS  . polyethylene glycol  17 g Oral BID  . senna-docusate  1 tablet Oral QHS  . sertraline  50 mg Oral Daily      OBJECTIVE: Physical Exam: Filed Vitals:   10/14/14 0030 10/14/14 0100 10/14/14 0259 10/14/14 0500  BP: 121/52 132/69 111/70 113/57  Pulse:      Temp: 97.7 F (36.5 C) 97.5 F (36.4 C) 97.9 F (36.6 C) 98.2 F (36.8 C)  TempSrc: Oral Oral Oral Oral  Resp: 18 20 18 17   Height:      Weight:      SpO2: 98% 100% 99% 99%   No intake or output data in the 24 hours ending 10/14/14 0750  Telemetry reveals sinus rhythm with occasional PVC's  GEN- The patient is well appearing, alert and oriented x 3 today.   Head- normocephalic, atraumatic Eyes-  Sclera clear, conjunctiva pink Ears- hearing intact Oropharynx- clear Neck- supple  Lungs- Clear to ausculation bilaterally, normal work of breathing Heart- Regular rate and rhythm  GI- soft, NT, ND, + BS Extremities- no clubbing, cyanosis, or edema Skin- no rash or lesion, left chest without hematoma/ecchymosis Psych- euthymic mood, full affect Neuro- strength and sensation are intact  LABS: Basic Metabolic Panel:  Recent Labs  10/12/14 0515 10/13/14 0549  NA 135 137  K 3.7 3.9  CL 100* 101  CO2 29 28  GLUCOSE 188* 126*  BUN 25* 21*  CREATININE 1.21 0.93  CALCIUM 8.7* 8.7*    CBC:  Recent Labs   10/12/14 0515 10/13/14 0549  WBC 8.2 7.3  HGB 13.2 13.7  HCT 39.1 40.1  MCV 88.3 88.9  PLT 253 232    RADIOLOGY: Ct Angio Head W/cm &/or Wo Cm 10/11/2014   CLINICAL DATA:  New onset left-sided weakness. New acute infarctions in the right hemisphere.  EXAM: CT ANGIOGRAPHY HEAD AND NECK  TECHNIQUE: Multidetector CT imaging of the head and neck was performed using the standard protocol during bolus administration of intravenous contrast. Multiplanar CT image reconstructions and MIPs were obtained to evaluate the vascular anatomy. Carotid stenosis measurements (when applicable) are obtained utilizing NASCET criteria, using the distal internal carotid diameter as the denominator.  CONTRAST:  130mL OMNIPAQUE IOHEXOL 350 MG/ML SOLN  COMPARISON:  MRI 10/10/2014  FINDINGS: CT HEAD  The brain shows generalized atrophy. There chronic small-vessel ischemic changes throughout the white matter. Sub cm acute infarctions in the right hemisphere in a watershed distribution are evident. No evidence of mass effect or hemorrhage. No hydrocephalus or extra-axial collection.  CTA NECK  Aortic arch: There is atherosclerosis of the aorta but no aneurysm or dissection. Branching pattern of the brachiocephalic vessel origins from the arch is normal without origin stenosis.  Right carotid system: Common carotid  artery shows mild atherosclerotic irregularity but no stenosis. At the carotid bifurcation, there is atherosclerotic plaque. Maximal stenosis of the proximal ICA is only 20%. There is mild irregularity in the ICA bulb. Cervical ICA beyond the bulb is widely patent.  Left carotid system: Common carotid artery widely patent to the bifurcation region with mild atherosclerotic irregularity. Atherosclerotic disease at the carotid bifurcation with 20% stenosis of the proximal ICA. Beyond that, the internal carotid artery is widely patent through the cervical region, though quite tortuous beneath the skullbase.  Vertebral  arteries:50% stenosis at the nondominant right vertebral artery origin. No stenosis of the dominant left vertebral artery origin. Both vessels are patent through the cervical region.  Skeleton: Moderate cervical spondylosis  Other neck: No mass or lymphadenopathy.  CTA HEAD  Anterior circulation: Both internal carotid arteries are patent through the skullbase. There is extensive atherosclerotic calcification in the siphon regions. Stenosis is estimated at 50-70% in the siphon regions bilaterally. Beyond that, the anterior and middle cerebral vessels are patent without proximal stenosis. No aneurysm or vascular malformation. No definite missing distal branches.  Posterior circulation: Both vertebral arteries are patent at the foramen magnum. There is calcified plaque affecting the distal vertebral arteries bilaterally. Both vertebral arteries are patent to the basilar. On the right, there are serial stenoses measuring 50-70%. On the left, there is stenosis of 50%. There is atherosclerotic disease of the basilar artery with proximal basilar stenosis of 50%. Distally, the vessel is widely patent. Major posterior circulation branch vessels appear patent.  Venous sinuses: Patent and normal  Anatomic variants: None significant  Delayed phase: Low level enhancement in the right parietal cortical and subcortical brain related to subacute infarction in those locations.  IMPRESSION: Atherosclerotic disease at both carotid bifurcations but no stenosis greater than 20% on either side.  Severe intracranial atherosclerotic disease affecting the carotid siphon regions. Serial 50-70% stenoses in the carotid siphon regions could be hemodynamically significant. No focal proximal anterior or middle cerebral artery finding.  50% stenosis of the right vertebral artery origin. No stenosis of the dominant left vertebral artery. Advanced atherosclerotic disease of both vertebral arteries in the intracranial portion with serial stenoses of  50-70%. 50% stenosis of the proximal basilar artery.   Electronically Signed   By: Nelson Chimes M.D.   On: 10/11/2014 12:54   Dg Chest 2 View 10/04/2014   CLINICAL DATA:  Shortness of breath and cough  EXAM: CHEST - 2 VIEW  COMPARISON:  None.  FINDINGS: Postsurgical changes are noted. The cardiac shadow is within normal limits. The lungs are clear bilaterally. No acute bony abnormality is noted.  IMPRESSION: No active disease.   Electronically Signed   By: Inez Catalina M.D.   On: 10/04/2014 13:03   ASSESSMENT AND PLAN:  Principal Problem:   Acute embolic stroke Active Problems:   DM type 2 (diabetes mellitus, type 2)   HTN (hypertension)   Hyperlipidemia   Coronary artery disease   Peripheral arterial disease   Type 2 diabetes mellitus with peripheral neuropathy   Stroke   Systolic CHF, chronic   Cardiomyopathy, ischemic  1.  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  HTN Stable No change required today  3.  CAD s/p CABG No recent ischemic symptoms Continue medical therapy  4.  Chronic systolic heart failure Euvolemic on exam today Continue medical therapy  5.  Cyrptogenic stroke Per neurology, with decreased EF and recurrent strokes, they would prefer systemic anticoagulation. Discussed with [facility] today. Will arrange for follow up at [facility] for Coumadin initiation.  Discussed with Burnetta Sabin, NP, ok from neurology standpoint to discontinue ASA and Plavix when Coumadin started - note sent to [facility] at [facility] from an EP standpoint to discharge home.  Follow up and instructions regarding ICD entered into AVS Electrophysiology team to see as needed while here. Please call with questions.   Chanetta Marshall, NP 10/14/2014 7:50 AM    willneed followup for coumadin

## 2014-10-15 ENCOUNTER — Encounter (HOSPITAL_COMMUNITY): Payer: Medicare HMO

## 2014-10-19 ENCOUNTER — Ambulatory Visit: Payer: Medicare HMO | Attending: Internal Medicine | Admitting: Pharmacist

## 2014-10-19 DIAGNOSIS — I4891 Unspecified atrial fibrillation: Secondary | ICD-10-CM

## 2014-10-19 LAB — POCT INR: INR: 1

## 2014-10-19 MED ORDER — WARFARIN SODIUM 5 MG PO TABS: 5.0000 mg | ORAL_TABLET | Freq: Every day | ORAL | Status: DC

## 2014-10-19 NOTE — Patient Instructions (Addendum)
Vitamin K Foods and Warfarin Warfarin is a medicine that helps prevent harmful blood clots by causing blood to clot more slowly. It does this by decreasing the activity of vitamin K, which promotes normal blood clotting. For the dose of warfarin you have been prescribed to work well, you need to get about the same amount of vitamin K from your food from day to day. Suddenly getting a lot more vitamin K could cause your blood to clot too quickly. A sudden decrease in vitamin K intake could cause your blood to clot too slowly. These changes in vitamin K intake could lead to dangerous blood clotsor to bleeding. WHAT GENERAL GUIDELINES DO I NEED TO FOLLOW?  Keep your intake of vitamin K consistent from day to day. To do this, you must be aware of which foods contain moderate or high amounts of vitamin K. Listed below are some foods that are very high, high, or moderately high in vitamin K. If you eat these foods, make sure you eat a consistent amount of them from day to day.  Avoid major changes in your diet, or tell your health care provider before changing your diet.  If you take a multivitamin that contains vitamin K, be sure to take it every day.  If you drink green tea, drink the same amount each day. WHAT FOODS ARE VERY HIGH IN VITAMIN K?   Greens, such as Swiss chard and beet, collard, mustard, or turnip greens (fresh or frozen, cooked).  Kale (fresh or frozen, cooked).   Parsley (raw).  Spinach (cooked).  WHAT FOODS ARE HIGH IN VITAMIN K?  Asparagus (frozen, cooked).  Beans, green (frozen, cooked).  Broccoli.   Bok choy (cooked).   Brussels sprouts (fresh or frozen, cooked).  Cabbage (cooked).  Coleslaw. WHAT FOODS ARE MODERATELY HIGH IN VITAMIN K?  Blueberries.  Black-eyed peas.  Endive (raw).   Green leaf lettuce (raw).   Green scallions (raw).  Kale (raw).  Okra (frozen, cooked).  Plantains (fried).  Romaine lettuce (raw).   Sauerkraut  (canned).   Spinach (raw). Document Released: 01/07/2009 Document Revised: 03/17/2013 Document Reviewed: 01/14/2013 [facility] Patient Information 2015 [place], [place]. This information is not intended to replace advice given to you by your health care provider. Make sure you discuss any questions you have with your health care provider.  Warfarin tablets What is this medicine? WARFARIN (WAR far in) is an anticoagulant. It is used to treat or prevent clots in the veins, arteries, lungs, or heart. This medicine may be used for other purposes; ask your health care provider or pharmacist if you have questions. COMMON BRAND NAME(S): Coumadin, Jantoven What should I tell my health care provider before I take this medicine? They need to know if you have any of these conditions: -alcoholism -anemia -bleeding disorders -cancer -diabetes -heart disease -high blood pressure -history of bleeding in the gastrointestinal tract -history of stroke or other brain injury or disease -kidney or liver disease -protein C deficiency -protein S deficiency -psychosis or dementia -recent injury, recent or planned surgery or procedure -an unusual or allergic reaction to warfarin, other medicines, foods, dyes, or preservatives -pregnant or trying to get pregnant -breast-feeding How should I use this medicine? Take this medicine by mouth with a glass of water. Follow the directions on the prescription label. You can take this medicine with or without food. Take your medicine at the same time each day. Do not take it more often than directed. Do not stop taking except on your  doctor's advice. Stopping this medicine may increase your risk of a blood clot. Be sure to refill your prescription before you run out of medicine. If your doctor or healthcare professional calls to change your dose, write down the dose and any other instructions. Always read the dose and instructions back to him or her to make sure you  understand them. Tell your doctor or healthcare professional what strength of tablets you have on hand. Ask how many tablets you should take to equal your new dose. Write the date on the new instructions and keep them near your medicine. If you are told to stop taking your medicine until your next blood test, call your doctor or healthcare professional if you do not hear anything within 24 hours of the test to find out your new dose or when to restart your prior dose. A special MedGuide will be given to you by the pharmacist with each prescription and refill. Be sure to read this information carefully each time. Talk to your pediatrician regarding the use of this medicine in children. Special care may be needed. Overdosage: If you think you have taken too much of this medicine contact a poison control center or emergency room at once. NOTE: This medicine is only for you. Do not share this medicine with others. What if I miss a dose? It is important not to miss a dose. If you miss a dose, call your healthcare provider. Take the dose as soon as possible on the same day. If it is almost time for your next dose, take only that dose. Do not take double or extra doses to make up for a missed dose. What may interact with this medicine? Do not take this medicine with any of the following medications: -agents that prevent or dissolve blood clots -aspirin or other salicylates -danshen -dextrothyroxine -mifepristone -St. John's Wort -red yeast rice This medicine may also interact with the following medications: -acetaminophen -agents that lower cholesterol -alcohol -allopurinol -amiodarone -antibiotics or medicines for treating bacterial, fungal or viral infections -azathioprine -barbiturate medicines for inducing sleep or treating seizures -certain medicines for diabetes -certain medicines for heart rhythm problems -certain medicines for high blood pressure -chloral  hydrate -cisapride -disulfiram -male hormones, including contraceptive or birth control pills -general anesthetics -herbal or dietary products like garlic, ginkgo, ginseng, green tea, or kava kava -influenza virus vaccine -male hormones -medicines for mental depression or psychosis -medicines for some types of cancer -medicines for stomach problems -methylphenidate -NSAIDs, medicines for pain and inflammation, like ibuprofen or naproxen -propoxyphene -quinidine, quinine -raloxifene -seizure or epilepsy medicine like carbamazepine, phenytoin, and valproic acid -steroids like cortisone and prednisone -tamoxifen -thyroid medicine -tramadol -vitamin c, vitamin e, and vitamin K -zafirlukast -zileuton This list may not describe all possible interactions. Give your health care provider a list of all the medicines, herbs, non-prescription drugs, or dietary supplements you use. Also tell them if you smoke, drink alcohol, or use illegal drugs. Some items may interact with your medicine. What should I watch for while using this medicine? Visit your doctor or health care professional for regular checks on your progress. You will need to have a blood test called a PT/INR regularly. The PT/INR blood test is done to make sure you are getting the right dose of this medicine. It is important to not miss your appointment for the blood tests. When you first start taking this medicine, these tests are done often. Once the correct dose is determined and you take your medicine properly,  these tests can be done less often. Notify your doctor or health care professional and seek emergency treatment if you develop breathing problems; changes in vision; chest pain; severe, sudden headache; pain, swelling, warmth in the leg; trouble speaking; sudden numbness or weakness of the face, arm or leg. These can be signs that your condition has gotten worse. While you are taking this medicine, carry an identification  card with your name, the name and dose of medicine(s) being used, and the name and phone number of your doctor or health care professional or person to contact in an emergency. Do not start taking or stop taking any medicines or over-the-counter medicines except on the advice of your doctor or health care professional. You should discuss your diet with your doctor or health care professional. Do not make major changes in your diet. Vitamin K can affect how well this medicine works. Many foods contain vitamin K. It is important to eat a consistent amount of foods with vitamin K. Other foods with vitamin K that you should eat in consistent amounts are asparagus, basil, beef or pork liver, black eyed peas, broccoli, brussel sprouts, cabbage, chickpeas, cucumber with peel, green onions, green tea, okra, parsley, peas, thyme, and green leafy vegetables like beet greens, collard greens, endive, kale, mustard greens, spinach, turnip greens, watercress, or certain lettuces like green leaf or romaine. This medicine can cause birth defects or bleeding in an unborn child. Women of childbearing age should use effective birth control while taking this medicine. If a woman becomes pregnant while taking this medicine, she should discuss the potential risks and her options with her health care professional. Avoid sports and activities that might cause injury while you are using this medicine. Severe falls or injuries can cause unseen bleeding. Be careful when using sharp tools or knives. Consider using an Copy. Take special care brushing or flossing your teeth. Report any injuries, bruising, or red spots on the skin to your doctor or health care professional. If you have an illness that causes vomiting, diarrhea, or fever for more than a few days, contact your doctor. Also check with your doctor if you are unable to eat for several days. These problems can change the effect of this medicine. Even after you stop  taking this medicine, it takes several days before your body recovers its normal ability to clot blood. Ask your doctor or health care professional how long you need to be careful. If you are going to have surgery or dental work, tell your doctor or health care professional that you have been taking this medicine. What side effects may I notice from receiving this medicine? Side effects that you should report to your doctor or health care professional as soon as possible: -back pain -chills -dizziness -fever -heavy menstrual bleeding or vaginal bleeding -painful, blue, or purple toes -painful, prolonged erection -signs and symptoms of bleeding such as bloody or black, tarry stools; red or dark-brown urine; spitting up blood or brown material that looks like coffee grounds; red spots on the skin; unusual bruising or bleeding from the eye, gums, or nose-skin rash, itching or skin damage -stomach pain -unusually weak or tired -yellowing of skin or eyes Side effects that usually do not require medical attention (report to your doctor or health care professional if they continue or are bothersome): -diarrhea -hair loss This list may not describe all possible side effects. Call your doctor for medical advice about side effects. You may report side effects to FDA  at 1-800-FDA-1088. Where should I keep my medicine? Keep out of the reach of children. Store at room temperature between 15 and 30 degrees C (59 and 86 degrees F). Protect from light. Throw away any unused medicine after the expiration date. Do not flush down the toilet. NOTE: This sheet is a summary. It may not cover all possible information. If you have questions about this medicine, talk to your doctor, pharmacist, or health care provider.  2015, [employer]. (2012-10-01 12:17:56)  Warfarin: What You Need to Know Warfarin is an anticoagulant. Anticoagulants help prevent the formation of blood clots. They also help stop the  growth of blood clots. Warfarin is sometimes referred to as a "blood thinner."  Normally, when body tissues are cut or damaged, the blood clots in order to prevent blood loss. Sometimes clots form inside your blood vessels and obstruct the flow of blood through your circulatory system (thrombosis). These clots may travel through your bloodstream and become lodged in smaller blood vessels in your brain, which can cause a stroke, or in your lungs (pulmonary embolism). WHO SHOULD USE WARFARIN? Warfarin is prescribed for people at risk of developing harmful blood clots:  People with surgically implanted mechanical heart valves, irregular heart rhythms called atrial fibrillation, and certain clotting disorders.  People who have developed harmful blood clotting in the past, including those who have had a stroke or a pulmonary embolism, or thrombosis in their legs (deep vein thrombosis [DVT]).  People with an existing blood clot, such as a pulmonary embolism. WARFARIN DOSING Warfarin tablets come in different strengths. Each tablet strength is a different color, with the amount of warfarin (in milligrams) clearly printed on the tablet. If the color of your tablet is different than usual when you receive a new prescription, report it immediately to your pharmacist or health care provider. WARFARIN MONITORING The goal of warfarin therapy is to lessen the clotting tendency of blood but not prevent clotting completely. Your health care provider will monitor the anticoagulation effect of warfarin closely and adjust your dose as needed. For your safety, blood tests called prothrombin time (PT) or international normalized ratio (INR) are used to measure the effects of warfarin. Both of these tests can be done with a finger stick or a blood draw. The longer it takes the blood to clot, the higher the PT or INR. Your health care provider will inform you of your "target" PT or INR range. If, at any time, your PT or INR  is above the target range, there is a risk of bleeding. If your PT or INR is below the target range, there is a risk of clotting. Whether you are started on warfarin while you are in the hospital or in your health care provider's office, you will need to have your PT or INR checked within one week of starting the medicine. Initially, some people are asked to have their PT or INR checked as much as twice a week. Once you are on a stable maintenance dose, the PT or INR is checked less often, usually once every 2 to 4 weeks. The warfarin dose may be adjusted if the PT or INR is not within the target range. It is important to keep all laboratory and health care provider follow-up appointments. Not keeping appointments could result in a chronic or permanent injury, pain, or disability because warfarin is a medicine that requires close monitoring. WHAT ARE THE SIDE EFFECTS OF WARFARIN?  Too much warfarin can cause bleeding (hemorrhage) from  any part of the body. This may include bleeding from the gums, blood in the urine, bloody or dark stools, a nosebleed that is not easily stopped, coughing up blood, or vomiting blood.  Too little warfarin can increase the risk of blood clots.  Too little or too much warfarin can also increase the risk of a stroke.  Warfarin use may cause a skin rash or irritation, an unusual fever, continual nausea or stomach upset, or severe pain in your joints or back. SPECIAL PRECAUTIONS WHILE TAKING WARFARIN Warfarin should be taken exactly as directed. It is very important to take warfarin as directed since bleeding or blood clots could result in chronic or permanent injury, pain, or disability.  Take your medicine at the same time every day. If you forget to take your dose, you can take it if it is within 6 hours of when it was due.  Do not change the dose of warfarin on your own to make up for missed or extra doses.  If you miss more than 2 doses in a row, you should contact  your health care provider for advice. Avoid situations that cause bleeding. You may have a tendency to bleed more easily than usual while taking warfarin. The following actions can limit bleeding:  Using a softer toothbrush.  Flossing with waxed floss rather than unwaxed floss.  Shaving with an Copy rather than a blade.  Limiting the use of sharp objects.  Avoiding potentially harmful activities, such as contact sports. Warfarin and Pregnancy or Breastfeeding  Warfarin is not advised during the first trimester of pregnancy due to an increased risk of birth defects. In certain situations, a woman may take warfarin after her first trimester of pregnancy. A woman who becomes pregnant or plans to become pregnant while taking warfarin should notify her health care provider immediately.  Although warfarin does not pass into breast milk, a woman who wishes to breastfeed while taking warfarin should also consult with her health care provider. Alcohol, Smoking, and Illicit Drug Use  Alcohol affects how warfarin works in the body. It is best to avoid alcoholic drinks or consume very small amounts while taking warfarin. In general, alcohol intake should be limited to 1 oz (30 mL) of liquor, 6 oz (180 mL) of wine, or 12 oz (360 mL) of beer each day. Notify your health care provider if you change your alcohol intake.  Smoking affects how warfarin works. It is best to avoid smoking while taking warfarin. Notify your health care provider if you change your smoking habits.  It is best to avoid all illicit drugs while taking warfarin since there are few studies that show how warfarin interacts with these drugs. Other Medicines and Dietary Supplements Many prescription and over-the-counter medicines can interfere with warfarin. Be sure all of your health care providers know you are taking warfarin. Notify your health care provider who prescribed warfarin for you or your pharmacist before starting or  stopping any new medicines, including over-the-counter vitamins, dietary supplements, and pain medicines. Your warfarin dose may need to be adjusted. Some common over-the-counter medicines that may increase the risk of bleeding while taking warfarin include:   Acetaminophen.  Aspirin.  Nonsteroidal anti-inflammatory medicines (NSAIDs), such as ibuprofen or naproxen.  Vitamin E. Dietary Considerations  Foods that have moderate or high amounts of vitamin K can interfere with warfarin. Avoid major changes in your diet or notify your health care provider before changing your diet. Eat a consistent amount of foods that  have moderate or high amounts of vitamin K. Eating less foods containing vitamin K can increase the risk of bleeding. Eating more foods containing vitamin K can increase the risk of blood clots. Additional questions about dietary considerations can be discussed with a dietitian. Foods that are very high in vitamin K:  Greens, such as Swiss chard and beet, collard, mustard, or turnip greens (fresh or frozen, cooked).  Kale (fresh or frozen, cooked).  Parsley (raw).  Spinach (cooked). Foods that are high in vitamin K:  Asparagus (frozen, cooked).  Beans, green (frozen, cooked).  Broccoli.  Bok choy (cooked).  Brussels sprouts (fresh or frozen, cooked).  Cabbage (cooked).   Coleslaw. Foods that are moderately high in vitamin K:  Blueberries.  Black-eyed peas.  Endive (raw).  Green leaf lettuce (raw).  Green scallions (raw).  Kale (raw).  Okra (frozen, cooked).  Plantains (fried).  Romaine lettuce (raw).  Sauerkraut (canned).  Spinach (raw). CALL YOUR CLINIC OR HEALTH CARE PROVIDER IF YOU:  Plan to have any surgery or procedure.  Feel sick, especially if you have diarrhea or vomiting.  Experience or anticipate any major changes in your diet.  Start or stop a prescription or over-the-counter medicine.  Become, plan to become, or think you may  be pregnant.  Are having heavier than usual menstrual periods.  Have had a fall, accident, or any symptoms of bleeding or unusual bruising.  Develop an unusual fever. CALL 911 IN THE [place] OR GO TO THE EMERGENCY DEPARTMENT IF YOU:   Think you may be having an allergic reaction to warfarin. The signs of an allergic reaction could include itching, rash, hives, swelling, chest tightness, or trouble breathing.  See signs of blood in your urine. The signs could include reddish, pinkish, or tea-colored urine.  See signs of blood in your stools. The signs could include bright red or black stools.  Vomit or cough up blood. In these instances, the blood could have either a bright red or a "coffee-grounds" appearance.  Have bleeding that will not stop after applying pressure for 30 minutes such as cuts, nosebleeds, or other injuries.  Have severe pain in your joints or back.  Have a new and severe headache.  Have sudden weakness or numbness of your face, arm, or leg, especially on one side of your body.  Have sudden confusion or trouble understanding.  Have sudden trouble seeing in one or both eyes.  Have sudden trouble walking, dizziness, loss of balance, or coordination.  Have trouble speaking or understanding (aphasia). Document Released: 03/12/2005 Document Revised: 07/27/2013 Document Reviewed: 09/05/2012 [facility] Patient Information 2015 [place], [place]. This information is not intended to replace advice given to you by your health care provider. Make sure you discuss any questions you have with your health care provider.

## 2014-10-19 NOTE — Patient Instructions (Addendum)
Vitamin K Foods and Warfarin Warfarin is a medicine that helps prevent harmful blood clots by causing blood to clot more slowly. It does this by decreasing the activity of vitamin K, which promotes normal blood clotting. For the dose of warfarin you have been prescribed to work well, you need to get about the same amount of vitamin K from your food from day to day. Suddenly getting a lot more vitamin K could cause your blood to clot too quickly. A sudden decrease in vitamin K intake could cause your blood to clot too slowly. These changes in vitamin K intake could lead to dangerous blood clotsor to bleeding. WHAT GENERAL GUIDELINES DO I NEED TO FOLLOW?  Keep your intake of vitamin K consistent from day to day. To do this, you must be aware of which foods contain moderate or high amounts of vitamin K. Listed below are some foods that are very high, high, or moderately high in vitamin K. If you eat these foods, make sure you eat a consistent amount of them from day to day.  Avoid major changes in your diet, or tell your health care provider before changing your diet.  If you take a multivitamin that contains vitamin K, be sure to take it every day.  If you drink green tea, drink the same amount each day. WHAT FOODS ARE VERY HIGH IN VITAMIN K?   Greens, such as Swiss chard and beet, collard, mustard, or turnip greens (fresh or frozen, cooked).  Kale (fresh or frozen, cooked).   Parsley (raw).  Spinach (cooked).  WHAT FOODS ARE HIGH IN VITAMIN K?  Asparagus (frozen, cooked).  Beans, green (frozen, cooked).  Broccoli.   Bok choy (cooked).   Brussels sprouts (fresh or frozen, cooked).  Cabbage (cooked).  Coleslaw. WHAT FOODS ARE MODERATELY HIGH IN VITAMIN K?  Blueberries.  Black-eyed peas.  Endive (raw).   Green leaf lettuce (raw).   Green scallions (raw).  Kale (raw).  Okra (frozen, cooked).  Plantains (fried).  Romaine lettuce (raw).   Sauerkraut  (canned).   Spinach (raw). Document Released: 01/07/2009 Document Revised: 03/17/2013 Document Reviewed: 01/14/2013 [facility] Patient Information 2015 [place], [place]. This information is not intended to replace advice given to you by your health care provider. Make sure you discuss any questions you have with your health care provider.  Warfarin tablets What is this medicine? WARFARIN (WAR far in) is an anticoagulant. It is used to treat or prevent clots in the veins, arteries, lungs, or heart. This medicine may be used for other purposes; ask your health care provider or pharmacist if you have questions. COMMON BRAND NAME(S): Coumadin, Jantoven What should I tell my health care provider before I take this medicine? They need to know if you have any of these conditions: -alcoholism -anemia -bleeding disorders -cancer -diabetes -heart disease -high blood pressure -history of bleeding in the gastrointestinal tract -history of stroke or other brain injury or disease -kidney or liver disease -protein C deficiency -protein S deficiency -psychosis or dementia -recent injury, recent or planned surgery or procedure -an unusual or allergic reaction to warfarin, other medicines, foods, dyes, or preservatives -pregnant or trying to get pregnant -breast-feeding How should I use this medicine? Take this medicine by mouth with a glass of water. Follow the directions on the prescription label. You can take this medicine with or without food. Take your medicine at the same time each day. Do not take it more often than directed. Do not stop taking except on your  doctor's advice. Stopping this medicine may increase your risk of a blood clot. Be sure to refill your prescription before you run out of medicine. If your doctor or healthcare professional calls to change your dose, write down the dose and any other instructions. Always read the dose and instructions back to him or her to make sure you  understand them. Tell your doctor or healthcare professional what strength of tablets you have on hand. Ask how many tablets you should take to equal your new dose. Write the date on the new instructions and keep them near your medicine. If you are told to stop taking your medicine until your next blood test, call your doctor or healthcare professional if you do not hear anything within 24 hours of the test to find out your new dose or when to restart your prior dose. A special MedGuide will be given to you by the pharmacist with each prescription and refill. Be sure to read this information carefully each time. Talk to your pediatrician regarding the use of this medicine in children. Special care may be needed. Overdosage: If you think you have taken too much of this medicine contact a poison control center or emergency room at once. NOTE: This medicine is only for you. Do not share this medicine with others. What if I miss a dose? It is important not to miss a dose. If you miss a dose, call your healthcare provider. Take the dose as soon as possible on the same day. If it is almost time for your next dose, take only that dose. Do not take double or extra doses to make up for a missed dose. What may interact with this medicine? Do not take this medicine with any of the following medications: -agents that prevent or dissolve blood clots -aspirin or other salicylates -danshen -dextrothyroxine -mifepristone -St. John's Wort -red yeast rice This medicine may also interact with the following medications: -acetaminophen -agents that lower cholesterol -alcohol -allopurinol -amiodarone -antibiotics or medicines for treating bacterial, fungal or viral infections -azathioprine -barbiturate medicines for inducing sleep or treating seizures -certain medicines for diabetes -certain medicines for heart rhythm problems -certain medicines for high blood pressure -chloral  hydrate -cisapride -disulfiram -male hormones, including contraceptive or birth control pills -general anesthetics -herbal or dietary products like garlic, ginkgo, ginseng, green tea, or kava kava -influenza virus vaccine -male hormones -medicines for mental depression or psychosis -medicines for some types of cancer -medicines for stomach problems -methylphenidate -NSAIDs, medicines for pain and inflammation, like ibuprofen or naproxen -propoxyphene -quinidine, quinine -raloxifene -seizure or epilepsy medicine like carbamazepine, phenytoin, and valproic acid -steroids like cortisone and prednisone -tamoxifen -thyroid medicine -tramadol -vitamin c, vitamin e, and vitamin K -zafirlukast -zileuton This list may not describe all possible interactions. Give your health care provider a list of all the medicines, herbs, non-prescription drugs, or dietary supplements you use. Also tell them if you smoke, drink alcohol, or use illegal drugs. Some items may interact with your medicine. What should I watch for while using this medicine? Visit your doctor or health care professional for regular checks on your progress. You will need to have a blood test called a PT/INR regularly. The PT/INR blood test is done to make sure you are getting the right dose of this medicine. It is important to not miss your appointment for the blood tests. When you first start taking this medicine, these tests are done often. Once the correct dose is determined and you take your medicine properly,  these tests can be done less often. Notify your doctor or health care professional and seek emergency treatment if you develop breathing problems; changes in vision; chest pain; severe, sudden headache; pain, swelling, warmth in the leg; trouble speaking; sudden numbness or weakness of the face, arm or leg. These can be signs that your condition has gotten worse. While you are taking this medicine, carry an identification  card with your name, the name and dose of medicine(s) being used, and the name and phone number of your doctor or health care professional or person to contact in an emergency. Do not start taking or stop taking any medicines or over-the-counter medicines except on the advice of your doctor or health care professional. You should discuss your diet with your doctor or health care professional. Do not make major changes in your diet. Vitamin K can affect how well this medicine works. Many foods contain vitamin K. It is important to eat a consistent amount of foods with vitamin K. Other foods with vitamin K that you should eat in consistent amounts are asparagus, basil, beef or pork liver, black eyed peas, broccoli, brussel sprouts, cabbage, chickpeas, cucumber with peel, green onions, green tea, okra, parsley, peas, thyme, and green leafy vegetables like beet greens, collard greens, endive, kale, mustard greens, spinach, turnip greens, watercress, or certain lettuces like green leaf or romaine. This medicine can cause birth defects or bleeding in an unborn child. Women of childbearing age should use effective birth control while taking this medicine. If a woman becomes pregnant while taking this medicine, she should discuss the potential risks and her options with her health care professional. Avoid sports and activities that might cause injury while you are using this medicine. Severe falls or injuries can cause unseen bleeding. Be careful when using sharp tools or knives. Consider using an Copy. Take special care brushing or flossing your teeth. Report any injuries, bruising, or red spots on the skin to your doctor or health care professional. If you have an illness that causes vomiting, diarrhea, or fever for more than a few days, contact your doctor. Also check with your doctor if you are unable to eat for several days. These problems can change the effect of this medicine. Even after you stop  taking this medicine, it takes several days before your body recovers its normal ability to clot blood. Ask your doctor or health care professional how long you need to be careful. If you are going to have surgery or dental work, tell your doctor or health care professional that you have been taking this medicine. What side effects may I notice from receiving this medicine? Side effects that you should report to your doctor or health care professional as soon as possible: -back pain -chills -dizziness -fever -heavy menstrual bleeding or vaginal bleeding -painful, blue, or purple toes -painful, prolonged erection -signs and symptoms of bleeding such as bloody or black, tarry stools; red or dark-brown urine; spitting up blood or brown material that looks like coffee grounds; red spots on the skin; unusual bruising or bleeding from the eye, gums, or nose-skin rash, itching or skin damage -stomach pain -unusually weak or tired -yellowing of skin or eyes Side effects that usually do not require medical attention (report to your doctor or health care professional if they continue or are bothersome): -diarrhea -hair loss This list may not describe all possible side effects. Call your doctor for medical advice about side effects. You may report side effects to FDA  at 1-800-FDA-1088. Where should I keep my medicine? Keep out of the reach of children. Store at room temperature between 15 and 30 degrees C (59 and 86 degrees F). Protect from light. Throw away any unused medicine after the expiration date. Do not flush down the toilet. NOTE: This sheet is a summary. It may not cover all possible information. If you have questions about this medicine, talk to your doctor, pharmacist, or health care provider.  2015, [employer]. (2012-10-01 12:17:56)  Warfarin: What You Need to Know Warfarin is an anticoagulant. Anticoagulants help prevent the formation of blood clots. They also help stop the  growth of blood clots. Warfarin is sometimes referred to as a "blood thinner."  Normally, when body tissues are cut or damaged, the blood clots in order to prevent blood loss. Sometimes clots form inside your blood vessels and obstruct the flow of blood through your circulatory system (thrombosis). These clots may travel through your bloodstream and become lodged in smaller blood vessels in your brain, which can cause a stroke, or in your lungs (pulmonary embolism). WHO SHOULD USE WARFARIN? Warfarin is prescribed for people at risk of developing harmful blood clots:  People with surgically implanted mechanical heart valves, irregular heart rhythms called atrial fibrillation, and certain clotting disorders.  People who have developed harmful blood clotting in the past, including those who have had a stroke or a pulmonary embolism, or thrombosis in their legs (deep vein thrombosis [DVT]).  People with an existing blood clot, such as a pulmonary embolism. WARFARIN DOSING Warfarin tablets come in different strengths. Each tablet strength is a different color, with the amount of warfarin (in milligrams) clearly printed on the tablet. If the color of your tablet is different than usual when you receive a new prescription, report it immediately to your pharmacist or health care provider. WARFARIN MONITORING The goal of warfarin therapy is to lessen the clotting tendency of blood but not prevent clotting completely. Your health care provider will monitor the anticoagulation effect of warfarin closely and adjust your dose as needed. For your safety, blood tests called prothrombin time (PT) or international normalized ratio (INR) are used to measure the effects of warfarin. Both of these tests can be done with a finger stick or a blood draw. The longer it takes the blood to clot, the higher the PT or INR. Your health care provider will inform you of your "target" PT or INR range. If, at any time, your PT or INR  is above the target range, there is a risk of bleeding. If your PT or INR is below the target range, there is a risk of clotting. Whether you are started on warfarin while you are in the hospital or in your health care provider's office, you will need to have your PT or INR checked within one week of starting the medicine. Initially, some people are asked to have their PT or INR checked as much as twice a week. Once you are on a stable maintenance dose, the PT or INR is checked less often, usually once every 2 to 4 weeks. The warfarin dose may be adjusted if the PT or INR is not within the target range. It is important to keep all laboratory and health care provider follow-up appointments. Not keeping appointments could result in a chronic or permanent injury, pain, or disability because warfarin is a medicine that requires close monitoring. WHAT ARE THE SIDE EFFECTS OF WARFARIN?  Too much warfarin can cause bleeding (hemorrhage) from  any part of the body. This may include bleeding from the gums, blood in the urine, bloody or dark stools, a nosebleed that is not easily stopped, coughing up blood, or vomiting blood.  Too little warfarin can increase the risk of blood clots.  Too little or too much warfarin can also increase the risk of a stroke.  Warfarin use may cause a skin rash or irritation, an unusual fever, continual nausea or stomach upset, or severe pain in your joints or back. SPECIAL PRECAUTIONS WHILE TAKING WARFARIN Warfarin should be taken exactly as directed. It is very important to take warfarin as directed since bleeding or blood clots could result in chronic or permanent injury, pain, or disability.  Take your medicine at the same time every day. If you forget to take your dose, you can take it if it is within 6 hours of when it was due.  Do not change the dose of warfarin on your own to make up for missed or extra doses.  If you miss more than 2 doses in a row, you should contact  your health care provider for advice. Avoid situations that cause bleeding. You may have a tendency to bleed more easily than usual while taking warfarin. The following actions can limit bleeding:  Using a softer toothbrush.  Flossing with waxed floss rather than unwaxed floss.  Shaving with an Copy rather than a blade.  Limiting the use of sharp objects.  Avoiding potentially harmful activities, such as contact sports. Warfarin and Pregnancy or Breastfeeding  Warfarin is not advised during the first trimester of pregnancy due to an increased risk of birth defects. In certain situations, a woman may take warfarin after her first trimester of pregnancy. A woman who becomes pregnant or plans to become pregnant while taking warfarin should notify her health care provider immediately.  Although warfarin does not pass into breast milk, a woman who wishes to breastfeed while taking warfarin should also consult with her health care provider. Alcohol, Smoking, and Illicit Drug Use  Alcohol affects how warfarin works in the body. It is best to avoid alcoholic drinks or consume very small amounts while taking warfarin. In general, alcohol intake should be limited to 1 oz (30 mL) of liquor, 6 oz (180 mL) of wine, or 12 oz (360 mL) of beer each day. Notify your health care provider if you change your alcohol intake.  Smoking affects how warfarin works. It is best to avoid smoking while taking warfarin. Notify your health care provider if you change your smoking habits.  It is best to avoid all illicit drugs while taking warfarin since there are few studies that show how warfarin interacts with these drugs. Other Medicines and Dietary Supplements Many prescription and over-the-counter medicines can interfere with warfarin. Be sure all of your health care providers know you are taking warfarin. Notify your health care provider who prescribed warfarin for you or your pharmacist before starting or  stopping any new medicines, including over-the-counter vitamins, dietary supplements, and pain medicines. Your warfarin dose may need to be adjusted. Some common over-the-counter medicines that may increase the risk of bleeding while taking warfarin include:   Acetaminophen.  Aspirin.  Nonsteroidal anti-inflammatory medicines (NSAIDs), such as ibuprofen or naproxen.  Vitamin E. Dietary Considerations  Foods that have moderate or high amounts of vitamin K can interfere with warfarin. Avoid major changes in your diet or notify your health care provider before changing your diet. Eat a consistent amount of foods that  have moderate or high amounts of vitamin K. Eating less foods containing vitamin K can increase the risk of bleeding. Eating more foods containing vitamin K can increase the risk of blood clots. Additional questions about dietary considerations can be discussed with a dietitian. Foods that are very high in vitamin K:  Greens, such as Swiss chard and beet, collard, mustard, or turnip greens (fresh or frozen, cooked).  Kale (fresh or frozen, cooked).  Parsley (raw).  Spinach (cooked). Foods that are high in vitamin K:  Asparagus (frozen, cooked).  Beans, green (frozen, cooked).  Broccoli.  Bok choy (cooked).  Brussels sprouts (fresh or frozen, cooked).  Cabbage (cooked).   Coleslaw. Foods that are moderately high in vitamin K:  Blueberries.  Black-eyed peas.  Endive (raw).  Green leaf lettuce (raw).  Green scallions (raw).  Kale (raw).  Okra (frozen, cooked).  Plantains (fried).  Romaine lettuce (raw).  Sauerkraut (canned).  Spinach (raw). CALL YOUR CLINIC OR HEALTH CARE PROVIDER IF YOU:  Plan to have any surgery or procedure.  Feel sick, especially if you have diarrhea or vomiting.  Experience or anticipate any major changes in your diet.  Start or stop a prescription or over-the-counter medicine.  Become, plan to become, or think you may  be pregnant.  Are having heavier than usual menstrual periods.  Have had a fall, accident, or any symptoms of bleeding or unusual bruising.  Develop an unusual fever. CALL 911 IN THE [place] OR GO TO THE EMERGENCY DEPARTMENT IF YOU:   Think you may be having an allergic reaction to warfarin. The signs of an allergic reaction could include itching, rash, hives, swelling, chest tightness, or trouble breathing.  See signs of blood in your urine. The signs could include reddish, pinkish, or tea-colored urine.  See signs of blood in your stools. The signs could include bright red or black stools.  Vomit or cough up blood. In these instances, the blood could have either a bright red or a "coffee-grounds" appearance.  Have bleeding that will not stop after applying pressure for 30 minutes such as cuts, nosebleeds, or other injuries.  Have severe pain in your joints or back.  Have a new and severe headache.  Have sudden weakness or numbness of your face, arm, or leg, especially on one side of your body.  Have sudden confusion or trouble understanding.  Have sudden trouble seeing in one or both eyes.  Have sudden trouble walking, dizziness, loss of balance, or coordination.  Have trouble speaking or understanding (aphasia). Document Released: 03/12/2005 Document Revised: 07/27/2013 Document Reviewed: 09/05/2012 [facility] Patient Information 2015 [place], [place]. This information is not intended to replace advice given to you by your health care provider. Make sure you discuss any questions you have with your health care provider.

## 2014-10-19 NOTE — Progress Notes (Signed)
Pharmacy Anticoagulation Clinic  Subjective: Patient presents today for warfarin initiation. 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  Objective: Today's INR = 1.0 (baseline, on no warfarin)  Goal INR 2-3   Assessment and Plan: Anticoagulation: Patient is to be started on warfarin today. Will initiate warfarin 5 mg daily. Patient extensively educated on warfarin, including the indication, how it works, how it is monitored (INR), risk of bruising and bleeding, signs and symptoms of bleeding, impact of diet on INR, and medications to avoid on warfarin (NSAIDs). Patient verbalized understanding. Patient was provided with warfarin educational handouts. Patient to immediately report any signs or symptoms of bleeding. Per discharge summary, Plavix can be discontinued once INR 2-3.

## 2014-10-20 ENCOUNTER — Ambulatory Visit (HOSPITAL_BASED_OUTPATIENT_CLINIC_OR_DEPARTMENT_OTHER): Payer: Medicare HMO | Admitting: Internal Medicine

## 2014-10-20 ENCOUNTER — Encounter: Payer: Self-pay | Admitting: Internal Medicine

## 2014-10-20 VITALS — BP 134/96 | HR 86 | Temp 98.5°F | Resp 16 | Ht 71.0 in | Wt 267.0 lb

## 2014-10-20 DIAGNOSIS — E1140 Type 2 diabetes mellitus with diabetic neuropathy, unspecified: Secondary | ICD-10-CM

## 2014-10-20 DIAGNOSIS — I739 Peripheral vascular disease, unspecified: Secondary | ICD-10-CM

## 2014-10-20 DIAGNOSIS — I639 Cerebral infarction, unspecified: Secondary | ICD-10-CM

## 2014-10-20 DIAGNOSIS — R059 Cough, unspecified: Secondary | ICD-10-CM

## 2014-10-20 DIAGNOSIS — R05 Cough: Secondary | ICD-10-CM | POA: Insufficient documentation

## 2014-10-20 DIAGNOSIS — I10 Essential (primary) hypertension: Secondary | ICD-10-CM | POA: Insufficient documentation

## 2014-10-20 DIAGNOSIS — Z7901 Long term (current) use of anticoagulants: Secondary | ICD-10-CM | POA: Insufficient documentation

## 2014-10-20 DIAGNOSIS — E785 Hyperlipidemia, unspecified: Secondary | ICD-10-CM | POA: Insufficient documentation

## 2014-10-20 DIAGNOSIS — Z79899 Other long term (current) drug therapy: Secondary | ICD-10-CM | POA: Insufficient documentation

## 2014-10-20 DIAGNOSIS — Z794 Long term (current) use of insulin: Secondary | ICD-10-CM | POA: Diagnosis not present

## 2014-10-20 DIAGNOSIS — E1142 Type 2 diabetes mellitus with diabetic polyneuropathy: Secondary | ICD-10-CM | POA: Insufficient documentation

## 2014-10-20 DIAGNOSIS — Z8673 Personal history of transient ischemic attack (TIA), and cerebral infarction without residual deficits: Secondary | ICD-10-CM | POA: Insufficient documentation

## 2014-10-20 DIAGNOSIS — Z87891 Personal history of nicotine dependence: Secondary | ICD-10-CM | POA: Diagnosis not present

## 2014-10-20 DIAGNOSIS — R531 Weakness: Secondary | ICD-10-CM | POA: Insufficient documentation

## 2014-10-20 DIAGNOSIS — M6289 Other specified disorders of muscle: Secondary | ICD-10-CM

## 2014-10-20 DIAGNOSIS — Z7982 Long term (current) use of aspirin: Secondary | ICD-10-CM | POA: Diagnosis not present

## 2014-10-20 DIAGNOSIS — Z7902 Long term (current) use of antithrombotics/antiplatelets: Secondary | ICD-10-CM | POA: Insufficient documentation

## 2014-10-20 DIAGNOSIS — F329 Major depressive disorder, single episode, unspecified: Secondary | ICD-10-CM | POA: Insufficient documentation

## 2014-10-20 LAB — GLUCOSE, POCT (MANUAL RESULT ENTRY): POC Glucose: 242 mg/dl — AB (ref 70–99)

## 2014-10-20 MED ORDER — LOSARTAN POTASSIUM 50 MG PO TABS: 50.0000 mg | ORAL_TABLET | Freq: Every day | ORAL | Status: DC

## 2014-10-20 MED ORDER — INSULIN DETEMIR 100 UNIT/ML ~~LOC~~ SOLN: 40.0000 [IU] | Freq: Two times a day (BID) | SUBCUTANEOUS | Status: DC

## 2014-10-20 NOTE — Patient Instructions (Signed)
Increase Levemir to 40 units twice per day   We will see you in 2 weeks for a nurse visit to recheck sugar log, please write down.

## 2014-10-20 NOTE — Progress Notes (Signed)
Patient ID: Thomas Sosa, male   DOB: 08-17-1955, 59 y.o.   MRN: [id number]  CC: HFU, stroke   HPI: Hospital course Thomas Sosa is a 59 y.o. 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 He said the weakness disappeared in 30 min but the numbness is persistent. He also had slurred speech and dysphagia that improved too. He denied facial droop but his left Bloom is numb. No ocular symptoms or double vision. He is complaint with his medication.   Today Patient was hospitalized on 7/17-7/21 for his 3rd stroke recently. He was discharged with a ICD to monitor for Atrial Fibrillation. He was also recently started on Coumadin with plans to discontinue Plavix once his INR is stable. He will follow up with coumadin clinic next week for a INR check. Neurology follow up in 2 months. Has has not smoked in 6 months. He reports that he still has numbness in his left side--upper arm and lower extremity. He reports he that he has a clogged artery in his left leg but was told that he was not a candidate for stent placement.  Patient is currently taking 35 units of Levemir twice daily and Novolog sliding scale. He had neuropathy before last stroke and he is on the max dose of Gabapentin currently. He reports that his last doctor in [place] had him on Lyrica which did not help his pain either. He checks his blood sugars 2-3 times per day. His fasting sugars are usually 370-430's. His 2 hour post prandial sugars are 250's. He denies eating carbs or fried foods. He refuses to see a endocrinologist for diabetes management.   Patient has No headache, No chest pain, No abdominal pain - No Nausea, No new weakness tingling or numbness, SOB.  No Known Allergies Past  Medical History  Diagnosis Date  . Hypertension   . Diabetes mellitus without complication   . Diabetic neuropathy Dx 2011  . Cancer of prostate Dx 2005  . Hyperlipidemia   . Coronary artery disease   . Tobacco abuse   . Peripheral arterial disease   . Stroke    Current Outpatient Prescriptions on File Prior to Visit  Medication Sig Dispense Refill  . aspirin EC 81 MG EC tablet Take 1 tablet (81 mg total) by mouth daily. 30 tablet 0  . atorvastatin (LIPITOR) 80 MG tablet Take 1 tablet (80 mg total) by mouth daily. 30 tablet 0  . carvedilol (COREG) 6.25 MG tablet Take 1 tablet (6.25 mg total) by mouth 2 (two) times daily with a meal. 60 tablet 0  . clopidogrel (PLAVIX) 75 MG tablet Take 1 tablet (75 mg total) by mouth daily. 30 tablet 2  . gabapentin (NEURONTIN) 600 MG tablet take 1 and 1/2 tablets by mouth three times a day 135 tablet 1  . glipiZIDE (GLUCOTROL XL) 5 MG 24 hr tablet Take 1 tablet (5 mg total) by mouth daily with breakfast. 30 tablet 2  . insulin aspart (NOVOLOG) 100 UNIT/ML injection Inject 10 Units into the skin 3 (three) times daily before meals. < 400 = 14 units; > 400 = call MD 10 mL 11  . insulin detemir (LEVEMIR) 100 UNIT/ML injection Inject 0.35 mLs (35 Units total) into the skin 2 (two)  times daily. 10 mL 11  . lisinopril (PRINIVIL,ZESTRIL) 20 MG tablet Take 1 tablet (20 mg total) by mouth daily. Blood pressure 30 tablet 4  . omeprazole (PRILOSEC) 20 MG capsule Take 1 capsule (20 mg total) by mouth daily. Acid reflux 30 capsule 4  . ONE TOUCH ULTRA TEST test strip TEST four times a day 100 each 6  . polyethylene glycol (MIRALAX / GLYCOLAX) packet Take 17 g by mouth 2 (two) times daily. 14 each 0  . sertraline (ZOLOFT) 50 MG tablet Take 1 tablet (50 mg total) by mouth daily. For depression 30 tablet 3  . traMADol (ULTRAM) 50 MG tablet Take 2 tablets (100 mg total) by mouth every 6 (six) hours as needed for moderate pain. 30 tablet 0  . warfarin (COUMADIN) 5 MG  tablet Take 1 tablet (5 mg total) by mouth daily. 30 tablet 3   No current facility-administered medications on file prior to visit.   Family History  Problem Relation Age of Onset  . Heart Problems Mother   . Heart Problems Father    History   Social History  . Marital Status: Widowed    Spouse Name: N/A  . Number of Children: N/A  . Years of Education: N/A   Occupational History  . Not on file.   Social History Main Topics  . Smoking status: Former Smoker    Quit date: 03/26/2014  . Smokeless tobacco: Not on file  . Alcohol Use: No  . Drug Use: Not on file  . Sexual Activity: Not on file   Other Topics Concern  . Not on file   Social History Narrative    Review of Systems: See HPI    Objective:   Filed Vitals:   10/20/14 1201  BP: 134/96  Pulse: 86  Temp: 98.5 F (36.9 C)  Resp: 16    Physical Exam  Constitutional: He is oriented to person, place, and time.  Cardiovascular: Normal rate, regular rhythm and normal heart sounds.   Pulmonary/Chest: Effort normal and breath sounds normal.  Neurological: He is alert and oriented to person, place, and time. No cranial nerve deficit (slightly slurred speech).  Left side upper/lower 4/5 strength Right side upper/lower 5/5   Skin: Skin is warm and dry.  Psychiatric: He has a normal mood and affect.     Lab Results  Component Value Date   WBC 7.3 10/13/2014   HGB 13.7 10/13/2014   HCT 40.1 10/13/2014   MCV 88.9 10/13/2014   PLT 232 10/13/2014   Lab Results  Component Value Date   CREATININE 0.93 10/13/2014   BUN 21* 10/13/2014   NA 137 10/13/2014   K 3.9 10/13/2014   CL 101 10/13/2014   CO2 28 10/13/2014    Lab Results  Component Value Date   HGBA1C 12.1* 10/05/2014   Lipid Panel     Component Value Date/Time   CHOL 179 10/05/2014 0540   TRIG 253* 10/05/2014 0540   HDL 28* 10/05/2014 0540   CHOLHDL 6.4 10/05/2014 0540   VLDL 51* 10/05/2014 0540   LDLCALC 100* 10/05/2014 0540        Assessment and plan:   Thomas Sosa was seen today for hospitalization follow-up.  Diagnoses and all orders for this visit:  Stroke Orders: -     Ambulatory referral to Physical Therapy Will send for PT. Continue Coumadin and D/C Plavix when INR is normal F/u With Neuro in 2 months   Left-sided weakness See above  Peripheral arterial disease  See above.   Type 2 diabetes mellitus with peripheral neuropathy Orders: -     POCT glucose (manual entry) Increase Levemir to 40 units BID and will recheck log in 2 weeks for additional medication changes. I have stressed the need for strict glycemic control. I have explained to patient that if I am unable to get control of his sugars then I will refer him to a Endocrinologist  Diabetic polyneuropathy associated with type 2 diabetes mellitus Orders: -     Ambulatory referral to Pain Clinic Patient has been unsuccessful with both max doses of gabapentin and Lyrica. I will now refer him to pain management for additional support  Essential hypertension Orders: -     losartan (COZAAR) 50 MG tablet; Take 1 tablet (50 mg total) by mouth daily. Discontinue Lisinopril 20 mg  Patient has a cough with Lisinopril. I will switch him to Losartan 50 mg and bring him back for a BP recheck. IF BP is above 140/90 then I will increase to Losartan 100 mg.   Cough See above   Return in about 2 weeks (around 11/03/2014) for Nurse Visit-log review/BP and 3 mo PCP .       [name], [place] [facility] [phone] 10/20/2014, 12:38 PM

## 2014-10-20 NOTE — Progress Notes (Signed)
Gad 7 score 13

## 2014-10-20 NOTE — Progress Notes (Signed)
HFU 3 strokes in the past 2 weeks  Elevated glucose took insulin 12 unit at 7 am

## 2014-10-21 ENCOUNTER — Ambulatory Visit (INDEPENDENT_AMBULATORY_CARE_PROVIDER_SITE_OTHER): Payer: Medicare HMO | Admitting: *Deleted

## 2014-10-21 DIAGNOSIS — I255 Ischemic cardiomyopathy: Secondary | ICD-10-CM | POA: Diagnosis not present

## 2014-10-21 LAB — CUP PACEART INCLINIC DEVICE CHECK: Date Time Interrogation Session: 20160728123752

## 2014-10-21 NOTE — Progress Notes (Signed)
Wound check appointment. Steri-strips removed. Wound without redness or edema. Incision edges approximated, wound well healed. Normal device function. Thresholds, sensing, and impedances consistent with implant measurements. Device programmed at 3.5V for extra safety margin until 3 month visit. Histogram distribution appropriate for patient and level of activity. No ventricular arrhythmias noted. Patient educated about wound care, arm mobility, lifting restrictions, shock plan. ROV 01-28-15 @ 915 with SK.

## 2014-10-25 ENCOUNTER — Ambulatory Visit: Payer: Medicare HMO | Attending: Internal Medicine | Admitting: Physical Therapy

## 2014-10-26 ENCOUNTER — Telehealth: Payer: Self-pay | Admitting: Clinical

## 2014-10-26 NOTE — Telephone Encounter (Signed)
F/u w pt; intro to [facility] services, pt will come in to see [facility] at 11:30am on 11-10-14

## 2014-10-27 ENCOUNTER — Encounter: Payer: Medicare HMO | Admitting: Nurse Practitioner

## 2014-11-03 ENCOUNTER — Ambulatory Visit: Payer: Medicare HMO | Admitting: Physical Therapy

## 2014-11-10 ENCOUNTER — Ambulatory Visit: Payer: Medicare HMO | Attending: Internal Medicine | Admitting: *Deleted

## 2014-11-10 ENCOUNTER — Ambulatory Visit (HOSPITAL_BASED_OUTPATIENT_CLINIC_OR_DEPARTMENT_OTHER): Payer: Medicare HMO | Admitting: Clinical

## 2014-11-10 VITALS — BP 107/73 | HR 87 | Temp 97.9°F | Resp 16 | Ht 71.5 in | Wt 263.4 lb

## 2014-11-10 DIAGNOSIS — F32A Depression, unspecified: Secondary | ICD-10-CM

## 2014-11-10 DIAGNOSIS — E1165 Type 2 diabetes mellitus with hyperglycemia: Secondary | ICD-10-CM | POA: Diagnosis present

## 2014-11-10 DIAGNOSIS — Z794 Long term (current) use of insulin: Secondary | ICD-10-CM | POA: Insufficient documentation

## 2014-11-10 DIAGNOSIS — I10 Essential (primary) hypertension: Secondary | ICD-10-CM | POA: Diagnosis not present

## 2014-11-10 DIAGNOSIS — E1142 Type 2 diabetes mellitus with diabetic polyneuropathy: Secondary | ICD-10-CM

## 2014-11-10 DIAGNOSIS — F329 Major depressive disorder, single episode, unspecified: Secondary | ICD-10-CM

## 2014-11-10 DIAGNOSIS — E1140 Type 2 diabetes mellitus with diabetic neuropathy, unspecified: Secondary | ICD-10-CM

## 2014-11-10 DIAGNOSIS — Z87891 Personal history of nicotine dependence: Secondary | ICD-10-CM | POA: Diagnosis not present

## 2014-11-10 LAB — GLUCOSE, POCT (MANUAL RESULT ENTRY)
POC Glucose: 304 mg/dl — AB (ref 70–99)
POC Glucose: 287 mg/dl — AB (ref 70–99)

## 2014-11-10 MED ORDER — INSULIN ASPART 100 UNIT/ML ~~LOC~~ SOLN
10.0000 [IU] | Freq: Once | SUBCUTANEOUS | Status: AC
  Administered 2014-11-10: 10 [IU] via SUBCUTANEOUS

## 2014-11-10 MED ORDER — INSULIN DETEMIR 100 UNIT/ML ~~LOC~~ SOLN
35.0000 [IU] | Freq: Two times a day (BID) | SUBCUTANEOUS | Status: DC
Start: 2014-11-10 — End: 2015-02-08

## 2014-11-10 NOTE — Progress Notes (Signed)
Patient presents for BP check, CBG and record review for T2DM, however, patient did not bring log or meter.  S/P CVA 2 weeks ago Med list reviewed; patient reports taking all meds as directed except taking warfarin and not plavix Taking 35 units levemir bid. States he forgot to increase it to 40 units as directed at last OV but will start tonight. States he forgot to take levemir last 2 days due to visitors at house. Patient reports AM fasting blood sugars ranging 89-350 Patient reports before lunch blood sugars ranging 120-275 Patient reports before dinner blood sugars ranging 200-350 Patient reports before bed blood sugars ranging 50-75 sometimes skips dinner to lose weight. Discussed importance of eating 3 meals per day with snacks in between. Discussed plate method.  Denies increased thirst and urination, blurred vision Changed diet to eliminate fried foods. Trying to choose low carb Walking 30-45 min on treadmill 6 days per week. Also riding stationary bike 30 min 6 days per week  CBG AM fasting: Patient did not check this AM CBG 304  2+ hours after 1 cup of coffee with powdered creamer and 2 tsp sugar 10 units novolog insulin given SQ per protocol   Lab Results  Component Value Date   HGBA1C 12.1* 10/05/2014   Filed Vitals:   11/10/14 1049  BP: 107/73  Pulse: 87  Temp: 97.9 F (36.6 C)  Resp: 16     Per PCP: Keep levemir at 35 units bid Don't skip meals Keep log and return for nurse visit in 2 weeks for BP check, CBG and record review  Patient given literature on [employer] for customized diet and lifestyle support  CBG 287 45 minutes after insulin admin. Patient discharged to home in stable condition.

## 2014-11-10 NOTE — Progress Notes (Signed)
ASSESSMENT: Pt currently experiencing symptoms of depression. Pt needs to f/u with PCP and [facility]; would benefit from psychoeducation and supportive counseling regarding coping with symptoms of depression.  Stage of Change: action  PLAN: 1. F/U with behavioral health consultant in as needed 2. Psychiatric Medications: Neurontin, Zoloft. 3. Behavioral recommendation(s):   -Spend time with supportive people -Consider reading educational material regarding coping with symptoms of depression SUBJECTIVE: Pt. referred by Chari Manning for symptoms of anxiety and depression:  Pt. reports the following symptoms/concerns: Pt states that he has been depressed most of his life, but that he keeps himself busy with cooking, baking, gardening; says that he sometimes isolates himself from others, and that it would help to increase the time he spends with other people.  Duration of problem: mild Severity: mild  OBJECTIVE: Orientation & Cognition: Oriented x3. Thought processes normal and appropriate to situation. Mood: appropriate. Affect: appropriate Appearance: appropriate Risk of harm to self or others: no risk of harm to self or others Substance use: none Assessments administered: PHQ9: 15/ GAD7: 8  Diagnosis: Depression CPT Code: F32.9 -------------------------------------------- Other(s) present in the room: none  Time spent with patient in exam room: 60 minutes

## 2014-11-23 ENCOUNTER — Telehealth: Payer: Self-pay | Admitting: Pharmacist

## 2014-11-23 NOTE — Telephone Encounter (Signed)
Called patient to follow up on anticoagulation management. Patient has not returned for a repeat INR since starting warfarin at the end of July.  Patient reports that he has been unable to come due to transportation.  I stressed the importance of warfarin/INR monitoring due to risk of new stroke or bleed. Patient verbalized understanding.  Appointment scheduled for 11/30/14.  May be worth consider Xarelto or another new oral anticoagulant if transportation will continue to be an issue with patient. Will discuss options with him at visit.    Nicoletta Ba, PharmD, BCPS, CPP

## 2014-11-30 ENCOUNTER — Ambulatory Visit: Payer: Medicare HMO | Attending: Internal Medicine | Admitting: Pharmacist

## 2014-11-30 LAB — POCT INR: INR: 1.2

## 2014-11-30 NOTE — Progress Notes (Signed)
Pharmacy Anticoagulation Clinic  Subjective: Patient presents today for INR monitoring. Anticoagulation indication is stroke (monitoring for atrial fibrillation).   Current dose of warfarin: 5 mg daily   Adherence to warfarin: patient reports missing all doses since Friday September 2nd due to going out of town and not refilling the medication prior to leaving Signs/symptoms of bleeding: denies Recent changes in diet: denies Recent changes in medications: denies Upcoming procedures that may impact anticoagulation: denies   Objective: Today's INR = 1.2   Assessment and Plan: Anticoagulation: Patient is Subtherapeutic based on patient's INR of 1.2 and patient's INR goal of 2-3. The INR is subtherapeutic due to missed doses of warfarin. Will continue patient's current dose of warfarin 5 mg daily. Stressed the importance of compliance of warfarin in the prevention of future strokes. Also instructed patient to take the clopidogrel until his INR is therapeutic. Patient reported that he was still not taking it and I told him he needed to pick it up today and start taking it.   Patient has had issues with transportation but bought a car on September 2nd so transportation and follow up with me should no longer be an issue.  Patient verbalized understanding and was provided with written instructions. Next INR check planned for 1 week.   Nicoletta Ba, PharmD, BCPS, [place] [facility] [phone]

## 2014-12-01 ENCOUNTER — Telehealth: Payer: Self-pay | Admitting: Internal Medicine

## 2014-12-01 NOTE — Telephone Encounter (Signed)
Patients wife called stating that their electricity was cut off and they need it cut back on due to him needing his insulin refrigerated, and he is also on a defibrillator that lets him know if his heart slows down and they need power for the machine to keep working.  Please f/u with pt.

## 2014-12-07 ENCOUNTER — Ambulatory Visit: Payer: Medicare HMO | Attending: Internal Medicine | Admitting: Pharmacist

## 2014-12-07 LAB — POCT INR: INR: 1.1

## 2014-12-07 NOTE — Progress Notes (Signed)
Patient complains of chills and feeling "clammy." He has been nauseous and vomited x 1 overnight. He denies and subjective fever and reports feeling cold. He denies stroke signs/symptoms. He reports adequate food and fluid intake. He reports that his blood sugars have been more elevated than usual and denies any episodes of hypoglycemia.   I think he may have a stomach virus due to symptoms and that his blood sugars are more elevated when these symptoms started. I instructed him to get plenty of rest, continue to eat and drink water, and that if his symptoms get worse to come back and see [place] for a walk-in appointment. Discouraged use of the emergency room unless it is a true emergency. Patient verbalized understanding.   Nicoletta Ba, PharmD, BCPS, [facility] [facility] [phone]

## 2014-12-09 ENCOUNTER — Other Ambulatory Visit: Payer: Self-pay | Admitting: Internal Medicine

## 2014-12-14 ENCOUNTER — Encounter: Payer: Medicare HMO | Admitting: Pharmacist

## 2014-12-14 ENCOUNTER — Encounter: Payer: Self-pay | Admitting: Internal Medicine

## 2014-12-15 ENCOUNTER — Other Ambulatory Visit: Payer: Self-pay | Admitting: Internal Medicine

## 2014-12-27 NOTE — Telephone Encounter (Signed)
Patient called requesting med refill on aspirin EC 81 MG EC tablet,  atorvastatin (LIPITOR) 80 MG tablet, losartan (COZAAR) 50 MG tablet, gabapentin (NEURONTIN) 600 MG tablet, And omeprazole (PRILOSEC) 20 MG capsule. Please f/u with pt.

## 2014-12-28 ENCOUNTER — Other Ambulatory Visit: Payer: Self-pay | Admitting: Internal Medicine

## 2014-12-28 DIAGNOSIS — E1142 Type 2 diabetes mellitus with diabetic polyneuropathy: Secondary | ICD-10-CM

## 2014-12-30 ENCOUNTER — Other Ambulatory Visit: Payer: Self-pay

## 2014-12-30 DIAGNOSIS — K219 Gastro-esophageal reflux disease without esophagitis: Secondary | ICD-10-CM

## 2014-12-30 DIAGNOSIS — E1142 Type 2 diabetes mellitus with diabetic polyneuropathy: Secondary | ICD-10-CM

## 2014-12-30 NOTE — Telephone Encounter (Signed)
So find out if patient was able to go to pain management.

## 2014-12-30 NOTE — Telephone Encounter (Signed)
Patient called requesting a med refill for in Blood pressure, Cholesterol,  aspirin EC 81 MG EC tablet, warfarin (COUMADIN) 5 MG tablet, and gabapentin (NEURONTIN) 600 MG tablet. Please f/u with pt.

## 2015-01-04 ENCOUNTER — Ambulatory Visit: Payer: Medicare HMO | Admitting: Neurology

## 2015-01-04 MED ORDER — ATORVASTATIN CALCIUM 80 MG PO TABS: 80.0000 mg | ORAL_TABLET | Freq: Every day | ORAL | Status: DC

## 2015-01-04 MED ORDER — ASPIRIN 81 MG PO TBEC: 81.0000 mg | DELAYED_RELEASE_TABLET | Freq: Every day | ORAL | Status: DC

## 2015-01-04 MED ORDER — GABAPENTIN 600 MG PO TABS: ORAL_TABLET | ORAL | Status: DC

## 2015-01-04 MED ORDER — WARFARIN SODIUM 5 MG PO TABS: 5.0000 mg | ORAL_TABLET | Freq: Every day | ORAL | Status: DC

## 2015-01-05 ENCOUNTER — Encounter: Payer: Self-pay | Admitting: Neurology

## 2015-01-10 ENCOUNTER — Telehealth: Payer: Self-pay | Admitting: Neurology

## 2015-01-10 NOTE — Telephone Encounter (Signed)
Pt called stating he received letter regarding missing appt on 01/04/15. Pt states he had went to [place] to visit his children.While there he had a stroke and was admitted to [facility] in [place], [place]. Patient was discharged 01/08/15 and got home 1am this morning. Pt will call back to r/s.

## 2015-01-10 NOTE — Telephone Encounter (Signed)
Rn call patient back about him having a stroke in [place] and discharge 01-08-15 from hospital in [place].Patient stated he feels a little better.Rn ask patient if he wanted me to schedule an appt with Dr.Sethi now. Patient stated he will call back later to schedule appt. Pt stated he has other appts but wanted to make sure it did not conflict when he schedules. Pt stated he will call back and reschedule.

## 2015-01-11 ENCOUNTER — Telehealth: Payer: Self-pay | Admitting: Internal Medicine

## 2015-01-11 NOTE — Telephone Encounter (Signed)
Patient called to let PCP know that he had another stroke while out of town and will making an appointment soon. Please f/u

## 2015-01-16 ENCOUNTER — Emergency Department (HOSPITAL_COMMUNITY)
Admission: EM | Admit: 2015-01-16 | Discharge: 2015-01-16 | Disposition: A | Discharge status: Home / Self Care | Payer: Medicare HMO | Attending: Emergency Medicine | Admitting: Emergency Medicine

## 2015-01-16 ENCOUNTER — Other Ambulatory Visit: Payer: Self-pay | Admitting: Internal Medicine

## 2015-01-16 ENCOUNTER — Emergency Department (HOSPITAL_COMMUNITY): Payer: Medicare HMO

## 2015-01-16 DIAGNOSIS — Z79899 Other long term (current) drug therapy: Secondary | ICD-10-CM | POA: Insufficient documentation

## 2015-01-16 DIAGNOSIS — Z87891 Personal history of nicotine dependence: Secondary | ICD-10-CM | POA: Diagnosis not present

## 2015-01-16 DIAGNOSIS — Z794 Long term (current) use of insulin: Secondary | ICD-10-CM | POA: Diagnosis not present

## 2015-01-16 DIAGNOSIS — Z8546 Personal history of malignant neoplasm of prostate: Secondary | ICD-10-CM | POA: Diagnosis not present

## 2015-01-16 DIAGNOSIS — I2510 Atherosclerotic heart disease of native coronary artery without angina pectoris: Secondary | ICD-10-CM | POA: Diagnosis not present

## 2015-01-16 DIAGNOSIS — R531 Weakness: Secondary | ICD-10-CM | POA: Insufficient documentation

## 2015-01-16 DIAGNOSIS — Z7901 Long term (current) use of anticoagulants: Secondary | ICD-10-CM | POA: Insufficient documentation

## 2015-01-16 DIAGNOSIS — E785 Hyperlipidemia, unspecified: Secondary | ICD-10-CM | POA: Insufficient documentation

## 2015-01-16 DIAGNOSIS — Z8673 Personal history of transient ischemic attack (TIA), and cerebral infarction without residual deficits: Secondary | ICD-10-CM | POA: Diagnosis not present

## 2015-01-16 DIAGNOSIS — I10 Essential (primary) hypertension: Secondary | ICD-10-CM | POA: Diagnosis not present

## 2015-01-16 DIAGNOSIS — Z7982 Long term (current) use of aspirin: Secondary | ICD-10-CM | POA: Insufficient documentation

## 2015-01-16 DIAGNOSIS — E119 Type 2 diabetes mellitus without complications: Secondary | ICD-10-CM | POA: Diagnosis not present

## 2015-01-16 DIAGNOSIS — K0889 Other specified disorders of teeth and supporting structures: Secondary | ICD-10-CM | POA: Diagnosis not present

## 2015-01-16 DIAGNOSIS — K029 Dental caries, unspecified: Secondary | ICD-10-CM | POA: Insufficient documentation

## 2015-01-16 DIAGNOSIS — R42 Dizziness and giddiness: Secondary | ICD-10-CM | POA: Diagnosis not present

## 2015-01-16 LAB — DIFFERENTIAL
BASOS ABS: 0 10*3/uL (ref 0.0–0.1)
BASOS PCT: 0 %
EOS ABS: 0.3 10*3/uL (ref 0.0–0.7)
Eosinophils Relative: 4 %
LYMPHS ABS: 2.2 10*3/uL (ref 0.7–4.0)
Lymphocytes Relative: 29 %
MONO ABS: 0.6 10*3/uL (ref 0.1–1.0)
MONOS PCT: 7 %
Neutro Abs: 4.5 10*3/uL (ref 1.7–7.7)
Neutrophils Relative %: 60 %

## 2015-01-16 LAB — COMPREHENSIVE METABOLIC PANEL
ALT: 19 U/L (ref 17–63)
AST: 17 U/L (ref 15–41)
Albumin: 3 g/dL — ABNORMAL LOW (ref 3.5–5.0)
Alkaline Phosphatase: 83 U/L (ref 38–126)
Anion gap: 8 (ref 5–15)
BILIRUBIN TOTAL: 0.6 mg/dL (ref 0.3–1.2)
BUN: 10 mg/dL (ref 6–20)
CHLORIDE: 102 mmol/L (ref 101–111)
CO2: 26 mmol/L (ref 22–32)
Calcium: 8.7 mg/dL — ABNORMAL LOW (ref 8.9–10.3)
Creatinine, Ser: 0.86 mg/dL (ref 0.61–1.24)
Glucose, Bld: 324 mg/dL — ABNORMAL HIGH (ref 65–99)
POTASSIUM: 3.8 mmol/L (ref 3.5–5.1)
Sodium: 136 mmol/L (ref 135–145)
TOTAL PROTEIN: 7.2 g/dL (ref 6.5–8.1)

## 2015-01-16 LAB — CBG MONITORING, ED: Glucose-Capillary: 195 mg/dL — ABNORMAL HIGH (ref 65–99)

## 2015-01-16 LAB — APTT: aPTT: 31 seconds (ref 24–37)

## 2015-01-16 LAB — CBC
HEMATOCRIT: 37.5 % — AB (ref 39.0–52.0)
HEMOGLOBIN: 12.8 g/dL — AB (ref 13.0–17.0)
MCH: 30.2 pg (ref 26.0–34.0)
MCHC: 34.1 g/dL (ref 30.0–36.0)
MCV: 88.4 fL (ref 78.0–100.0)
Platelets: 261 10*3/uL (ref 150–400)
RBC: 4.24 MIL/uL (ref 4.22–5.81)
RDW: 13.4 % (ref 11.5–15.5)
WBC: 7.5 10*3/uL (ref 4.0–10.5)

## 2015-01-16 LAB — I-STAT TROPONIN, ED: TROPONIN I, POC: 0.02 ng/mL (ref 0.00–0.08)

## 2015-01-16 LAB — PROTIME-INR
INR: 1.3 (ref 0.00–1.49)
Prothrombin Time: 16.4 seconds — ABNORMAL HIGH (ref 11.6–15.2)

## 2015-01-16 MED ORDER — PENICILLIN V POTASSIUM 500 MG PO TABS: 500.0000 mg | ORAL_TABLET | Freq: Four times a day (QID) | ORAL | Status: DC

## 2015-01-16 MED ORDER — HYDROCODONE-ACETAMINOPHEN 5-325 MG PO TABS: 1.0000 | ORAL_TABLET | Freq: Four times a day (QID) | ORAL | Status: DC | PRN

## 2015-01-16 MED ORDER — INSULIN ASPART 100 UNIT/ML ~~LOC~~ SOLN
10.0000 [IU] | Freq: Once | SUBCUTANEOUS | Status: DC
  Filled 2015-01-16: qty 1

## 2015-01-16 MED ORDER — HYDROMORPHONE HCL 1 MG/ML IJ SOLN
1.0000 mg | Freq: Once | INTRAMUSCULAR | Status: AC
  Administered 2015-01-16: 1 mg via INTRAVENOUS
  Filled 2015-01-16: qty 1

## 2015-01-16 MED ORDER — SODIUM CHLORIDE 0.9 % IV SOLN
INTRAVENOUS | Status: DC
  Administered 2015-01-16: 20:00:00 via INTRAVENOUS

## 2015-01-16 MED ORDER — CLONIDINE HCL 0.2 MG PO TABS
0.2000 mg | ORAL_TABLET | Freq: Once | ORAL | Status: AC
  Administered 2015-01-16: 0.2 mg via ORAL
  Filled 2015-01-16: qty 1

## 2015-01-16 MED ORDER — ONDANSETRON HCL 4 MG/2ML IJ SOLN
4.0000 mg | Freq: Once | INTRAMUSCULAR | Status: AC
  Administered 2015-01-16: 4 mg via INTRAVENOUS
  Filled 2015-01-16: qty 2

## 2015-01-16 NOTE — ED Notes (Signed)
Pt clarified that the symptoms began at 10 am and is predominately lightheadedness. Dr. Earnest Conroy states to not call a code stroke but to do a head CT

## 2015-01-16 NOTE — ED Provider Notes (Signed)
CSN: [id number]     Arrival date & time 01/16/15  1715 History   First MD Initiated Contact with Patient 01/16/15 1804     Chief Complaint  Patient presents with  . Dizziness     (Consider location/radiation/quality/duration/timing/severity/associated sxs/prior Treatment) Patient is a 59 y.o. male presenting with dizziness. The history is provided by the patient.  Dizziness Associated symptoms: weakness   Associated symptoms: no chest pain and no shortness of breath    patient with a history of strokes in the past at least 4-5 most recently in July. Patient to 10:00 this morning started with lightheadedness some dizziness but no vertigo. No other new focal neurological findings. Patient has baseline residual left-sided weakness. Patient seen by me in triage. Based on the timing was not a candidate for code stroke. But also symptoms were very minimal.  Patient is followed by the wellness clinic. Patient known to have a history of hypertension. Patient has diabetes.  Patient's main complaint is been upper tooth pain. This been going on for 2 weeks. Patient states the tooth pain is 8 out of 10.  Past Medical History  Diagnosis Date  . Hypertension   . Diabetes mellitus without complication   . Diabetic neuropathy Dx 2011  . Cancer of prostate Dx 2005  . Hyperlipidemia   . Coronary artery disease   . Tobacco abuse   . Peripheral arterial disease   . Stroke    Past Surgical History  Procedure Laterality Date  . Replacement total knee  Lt 2013/ Rt 2005  . Ep implantable device N/A 10/13/2014    Procedure: ICD Implant;  Surgeon: Deboraha Sprang, MD;  Location: [facility];  Service: Cardiovascular;  Laterality: N/A;  . Tee without cardioversion N/A 10/11/2014    Procedure: TRANSESOPHAGEAL ECHOCARDIOGRAM (TEE);  Surgeon: Dorothy Spark, MD;  Location: [facility] ENDOSCOPY;  Service: Cardiovascular;  Laterality: N/A;   Family History  Problem Relation Age of Onset  . Heart Problems  Mother   . Heart Problems Father    Social History  Substance Use Topics  . Smoking status: Former Smoker    Quit date: 03/26/2014  . Smokeless tobacco: Not on file  . Alcohol Use: No    Review of Systems  Constitutional: Negative for fever.  HENT: Positive for dental problem.   Respiratory: Negative for shortness of breath.   Cardiovascular: Negative for chest pain.  Gastrointestinal: Negative for abdominal pain.  Genitourinary: Negative for dysuria.  Musculoskeletal: Negative for back pain and neck pain.  Skin: Negative for rash.  Neurological: Positive for dizziness, facial asymmetry, weakness and light-headedness.  Hematological: Does not bruise/bleed easily.  Psychiatric/Behavioral: Negative for confusion.      Allergies  Lisinopril  Home Medications   Prior to Admission medications   Medication Sig Start Date End Date Taking? Authorizing Provider  aspirin 81 MG EC tablet Take 1 tablet (81 mg total) by mouth daily. 01/04/15   Tresa Garter, MD  atorvastatin (LIPITOR) 80 MG tablet Take 1 tablet (80 mg total) by mouth daily. 01/04/15   Tresa Garter, MD  carvedilol (COREG) 6.25 MG tablet Take 1 tablet (6.25 mg total) by mouth 2 (two) times daily with a meal. 10/14/14   Belkys A Regalado, MD  clopidogrel (PLAVIX) 75 MG tablet Take 1 tablet (75 mg total) by mouth daily. Patient not taking: Reported on 11/10/2014 10/06/14   Erline Hau, MD  gabapentin (NEURONTIN) 600 MG tablet take 1 and 1/2 tablets by  mouth three times a day 01/04/15   Tresa Garter, MD  glipiZIDE (GLUCOTROL XL) 5 MG 24 hr tablet Take 1 tablet (5 mg total) by mouth daily with breakfast. 05/25/14   [name], NP  HYDROcodone-acetaminophen (NORCO/VICODIN) 5-325 MG tablet Take 1-2 tablets by mouth every 6 (six) hours as needed for moderate pain. 01/16/15   Fredia Sorrow, MD  insulin aspart (NOVOLOG) 100 UNIT/ML injection Inject 10 Units into the skin 3 (three) times daily  before meals. < 400 = 14 units; > 400 = call MD 10/14/14   Belkys A Regalado, MD  insulin detemir (LEVEMIR) 100 UNIT/ML injection Inject 0.35 mLs (35 Units total) into the skin 2 (two) times daily. 11/10/14   [name], NP  losartan (COZAAR) 50 MG tablet Take 1 tablet (50 mg total) by mouth daily. 10/20/14   [name], NP  omeprazole (PRILOSEC) 20 MG capsule Take 1 capsule (20 mg total) by mouth daily. Acid reflux 02/22/14   [name], NP  ONE TOUCH ULTRA TEST test strip TEST four times a day 06/07/14   [name], NP  penicillin v potassium (VEETID) 500 MG tablet Take 1 tablet (500 mg total) by mouth 4 (four) times daily. 01/16/15   Fredia Sorrow, MD  polyethylene glycol (MIRALAX / GLYCOLAX) packet Take 17 g by mouth 2 (two) times daily. 10/14/14   Belkys A Regalado, MD  sertraline (ZOLOFT) 50 MG tablet Take 1 tablet (50 mg total) by mouth daily. For depression 02/22/14   [name], NP  traMADol (ULTRAM) 50 MG tablet Take 2 tablets (100 mg total) by mouth every 6 (six) hours as needed for moderate pain. 10/14/14   Belkys A Regalado, MD  warfarin (COUMADIN) 5 MG tablet Take 1 tablet (5 mg total) by mouth daily. 01/04/15   Olugbemiga E Jegede, MD   BP 198/104 mmHg  Pulse 80  Resp 17  SpO2 96% Physical Exam  Constitutional: He is oriented to person, place, and time. He appears well-developed and well-nourished. No distress.  HENT:  Head: Normocephalic and atraumatic.  Mouth/Throat: Oropharynx is clear and moist.  Multiple severe dental decay. No floor the mouth swelling. No purulent discharge. Tenderness in multiple teeth in the upper part of the mouth. No facial swelling.  Eyes: Conjunctivae and EOM are normal. Pupils are equal, round, and reactive to light.  Neck: Normal range of motion.  Cardiovascular: Normal rate, regular rhythm and normal heart sounds.   No murmur heard. Pulmonary/Chest: Effort normal and breath sounds normal. No respiratory distress.  Abdominal:  Soft. Bowel sounds are normal. There is no tenderness.  Musculoskeletal: Normal range of motion. He exhibits no edema.  Lymphadenopathy:    He has no cervical adenopathy.  Neurological: He is alert and oriented to person, place, and time.  Residual left-sided weakness.  Skin: Skin is warm.  Nursing note and vitals reviewed.   ED Course  Procedures (including critical care time) Labs Review Labs Reviewed  PROTIME-INR - Abnormal; Notable for the following:    Prothrombin Time 16.4 (*)    All other components within normal limits  CBC - Abnormal; Notable for the following:    Hemoglobin 12.8 (*)    HCT 37.5 (*)    All other components within normal limits  COMPREHENSIVE METABOLIC PANEL - Abnormal; Notable for the following:    Glucose, Bld 324 (*)    Calcium 8.7 (*)    Albumin 3.0 (*)    All other components  within normal limits  CBG MONITORING, ED - Abnormal; Notable for the following:    Glucose-Capillary 195 (*)    All other components within normal limits  APTT  DIFFERENTIAL  I-STAT TROPOININ, ED   Results for orders placed or performed during the hospital encounter of 01/16/15  Protime-INR  Result Value Ref Range   Prothrombin Time 16.4 (H) 11.6 - 15.2 seconds   INR 1.30 0.00 - 1.49  APTT  Result Value Ref Range   aPTT 31 24 - 37 seconds  CBC  Result Value Ref Range   WBC 7.5 4.0 - 10.5 K/uL   RBC 4.24 4.22 - 5.81 MIL/uL   Hemoglobin 12.8 (L) 13.0 - 17.0 g/dL   HCT 37.5 (L) 39.0 - 52.0 %   MCV 88.4 78.0 - 100.0 fL   MCH 30.2 26.0 - 34.0 pg   MCHC 34.1 30.0 - 36.0 g/dL   RDW 13.4 11.5 - 15.5 %   Platelets 261 150 - 400 K/uL  Differential  Result Value Ref Range   Neutrophils Relative % 60 %   Neutro Abs 4.5 1.7 - 7.7 K/uL   Lymphocytes Relative 29 %   Lymphs Abs 2.2 0.7 - 4.0 K/uL   Monocytes Relative 7 %   Monocytes Absolute 0.6 0.1 - 1.0 K/uL   Eosinophils Relative 4 %   Eosinophils Absolute 0.3 0.0 - 0.7 K/uL   Basophils Relative 0 %   Basophils  Absolute 0.0 0.0 - 0.1 K/uL  Comprehensive metabolic panel  Result Value Ref Range   Sodium 136 135 - 145 mmol/L   Potassium 3.8 3.5 - 5.1 mmol/L   Chloride 102 101 - 111 mmol/L   CO2 26 22 - 32 mmol/L   Glucose, Bld 324 (H) 65 - 99 mg/dL   BUN 10 6 - 20 mg/dL   Creatinine, Ser 0.86 0.61 - 1.24 mg/dL   Calcium 8.7 (L) 8.9 - 10.3 mg/dL   Total Protein 7.2 6.5 - 8.1 g/dL   Albumin 3.0 (L) 3.5 - 5.0 g/dL   AST 17 15 - 41 U/L   ALT 19 17 - 63 U/L   Alkaline Phosphatase 83 38 - 126 U/L   Total Bilirubin 0.6 0.3 - 1.2 mg/dL   GFR calc non Af Amer >60 >60 mL/min   GFR calc Af Amer >60 >60 mL/min   Anion gap 8 5 - 15  I-stat troponin, ED (not at [facility])  Result Value Ref Range   Troponin i, poc 0.02 0.00 - 0.08 ng/mL   Comment 3          CBG monitoring, ED  Result Value Ref Range   Glucose-Capillary 195 (H) 65 - 99 mg/dL     Imaging Review Ct Head Wo Contrast  01/16/2015  CLINICAL DATA:  Dizziness and lightheadedness, recent stroke EXAM: CT HEAD WITHOUT CONTRAST TECHNIQUE: Contiguous axial images were obtained from the base of the skull through the vertex without intravenous contrast. COMPARISON:  10/10/2014 FINDINGS: Bony calvarium is intact. Mucosal thickening is noted throughout the ethmoid sinuses of uncertain chronicity. Scattered areas of decreased attenuation are noted within the right cerebral hemisphere similar to that seen on the prior MRI examination consistent with small lacunar infarcts. No acute infarction is identified. No findings to suggest acute hemorrhage or space-occupying mass lesion are noted. IMPRESSION: Changes of prior lacunar infarction.  No acute abnormality is noted. Electronically Signed   By: Inez Catalina M.D.   On: 01/16/2015 18:12   I have personally  reviewed and evaluated these images and lab results as part of my medical decision-making.   EKG Interpretation None      MDM   Final diagnoses:  Dizziness  Essential hypertension  Toothache     Patient with history of several strokes in the past. Most recently July. No been hemorrhagic strokes. Patient had cardiac pacemaker defibrillator placed in August and can no longer have MRIs. Head CT here today was negative. Patient with persistent hypertension. Patient's initial symptom of lightheadedness and dizziness without vertigo has resolved. That started about 10 in the morning. No other new focal findings. Patient has a persistent residual findings that include memory difficulties baseline left-sided weakness.  Patient's workup here without significant abnormalities other than hyperglycemia came down naturally no insulin provided. Patient also with complaint of dental pain is severe decay of several teeth. Patient treated with pain medicine for the mouth pain no real improvement in the blood pressure. Trial of clonidine also no change. Currently blood pressure is 198/104 at been consistently like that since he's been here.  Patient is on blood pressure medicine is followed by the wellness clinic for his blood pressure control. He has an appointment on Tuesday for this.  Patient symptoms have now resolved. Precautions given for patient to return for severe headache new stroke symptoms chest pain or shortness of breath. Otherwise blood pressure can be brought under gradual control of through his primary care doctor.  Patient also referred to [facility]. Will be treated with the pain medicine for the tooth pain and penicillin.    Fredia Sorrow, MD 01/16/15 2219

## 2015-01-16 NOTE — ED Notes (Signed)
Pt reports to the ED for eval of dizziness and lightheadedness that began approx 1 hour ago. Was just d/c 1.5 weeks goa from a hospital in [place] for a stroke. Baseline weakness on the left side , memory difficulties, and left sided facial droop. Left arm drift and left grip weaker. Smile symmetric at this time. Denies any CP or unusual SOB. Pt A&Ox4, resp e/u, and skin warm and dry.

## 2015-01-16 NOTE — ED Notes (Signed)
Called patient for room assignment x 2 but no answer.

## 2015-01-16 NOTE — Discharge Instructions (Signed)
Return for any new or worse symptoms. Return for development of headache shortness of breath chest pain or any new stroke symptoms. Today's head CT was negative. Take the penicillin and hydrocodone as needed for the tooth pain. Blood pressure has remained elevated. Follow-up with the wellness clinic on Tuesday as scheduled. May need adjustment in blood pressure medicine. Follow-up with dentist would be helpful for the tooth problems.

## 2015-01-16 NOTE — ED Notes (Signed)
Patient left at this time with all belongings. 

## 2015-01-16 NOTE — ED Notes (Addendum)
Dr. Rogene Houston made aware of patient and he states he will come see the patient and to hold off on calling code stroke until he assesses the patient.

## 2015-01-17 ENCOUNTER — Other Ambulatory Visit: Payer: Self-pay | Admitting: Internal Medicine

## 2015-01-17 NOTE — Telephone Encounter (Signed)
Pt. Called stating he went to the ED and his BP was high it was 197/118. Pt. Stated he thinks that the med of amitriptyline is not working. Please f/u with pt.

## 2015-01-18 ENCOUNTER — Telehealth: Payer: Self-pay

## 2015-01-18 MED ORDER — OMEPRAZOLE 20 MG PO CPDR: 20.0000 mg | DELAYED_RELEASE_CAPSULE | Freq: Every day | ORAL | Status: DC

## 2015-01-18 NOTE — Telephone Encounter (Signed)
Returned patient phone call Patient not available Left message on voice mail to return our call 

## 2015-01-18 NOTE — Telephone Encounter (Addendum)
Nurse called patient, patient verified date of birth. Patient went to pain management. They want him to have sleep study prior to giving any pain medications. Patient had stroke in [place] a couple of weeks ago and was at [facility] ED "a couple days ago", BP 189/119. Patient has appointment tomorrow.  Patient did not have serious BP problems until he started amitriptyline. Patient states "It was a little high, but never that high. Since I haven't took it for the last couple days my BP has went down." Patients BP currently 130/85. Patient needs BP medication refilled and omeprazole. Patient has appointment tomorrow with Mateo Flow at [facility].

## 2015-01-18 NOTE — Telephone Encounter (Signed)
Pt. Returned call. Please f/u with pt. °

## 2015-01-18 NOTE — Telephone Encounter (Signed)
Nurse called patient, patient verified date of birth. Patient has not followed up with neurologist, does not have neurologist. Patient agrees to bring papers from hospital stay in [place] and all medications. Patient aware of omeprazole being sent to pharmacy. Provider will discuss BP medications at visit tomorrow.

## 2015-01-19 ENCOUNTER — Inpatient Hospital Stay: Payer: Medicare HMO | Admitting: Internal Medicine

## 2015-01-20 ENCOUNTER — Ambulatory Visit: Payer: Medicare HMO | Admitting: Pharmacist

## 2015-01-20 ENCOUNTER — Ambulatory Visit: Payer: Medicare HMO | Attending: Internal Medicine | Admitting: Internal Medicine

## 2015-01-20 ENCOUNTER — Encounter: Payer: Self-pay | Admitting: Internal Medicine

## 2015-01-20 VITALS — BP 121/82 | HR 93 | Temp 98.0°F | Resp 16 | Ht 71.0 in | Wt 257.2 lb

## 2015-01-20 DIAGNOSIS — Z8673 Personal history of transient ischemic attack (TIA), and cerebral infarction without residual deficits: Secondary | ICD-10-CM | POA: Insufficient documentation

## 2015-01-20 DIAGNOSIS — I10 Essential (primary) hypertension: Secondary | ICD-10-CM

## 2015-01-20 DIAGNOSIS — Z5181 Encounter for therapeutic drug level monitoring: Secondary | ICD-10-CM | POA: Diagnosis not present

## 2015-01-20 DIAGNOSIS — E118 Type 2 diabetes mellitus with unspecified complications: Secondary | ICD-10-CM | POA: Diagnosis not present

## 2015-01-20 DIAGNOSIS — I739 Peripheral vascular disease, unspecified: Secondary | ICD-10-CM | POA: Insufficient documentation

## 2015-01-20 DIAGNOSIS — Z23 Encounter for immunization: Secondary | ICD-10-CM | POA: Diagnosis not present

## 2015-01-20 DIAGNOSIS — K0889 Other specified disorders of teeth and supporting structures: Secondary | ICD-10-CM | POA: Diagnosis not present

## 2015-01-20 DIAGNOSIS — IMO0002 Reserved for concepts with insufficient information to code with codable children: Secondary | ICD-10-CM

## 2015-01-20 DIAGNOSIS — Z794 Long term (current) use of insulin: Secondary | ICD-10-CM | POA: Insufficient documentation

## 2015-01-20 DIAGNOSIS — Z7982 Long term (current) use of aspirin: Secondary | ICD-10-CM | POA: Insufficient documentation

## 2015-01-20 DIAGNOSIS — I639 Cerebral infarction, unspecified: Secondary | ICD-10-CM

## 2015-01-20 DIAGNOSIS — K219 Gastro-esophageal reflux disease without esophagitis: Secondary | ICD-10-CM

## 2015-01-20 DIAGNOSIS — E1165 Type 2 diabetes mellitus with hyperglycemia: Secondary | ICD-10-CM | POA: Insufficient documentation

## 2015-01-20 DIAGNOSIS — Z7902 Long term (current) use of antithrombotics/antiplatelets: Secondary | ICD-10-CM | POA: Insufficient documentation

## 2015-01-20 DIAGNOSIS — Z87891 Personal history of nicotine dependence: Secondary | ICD-10-CM | POA: Insufficient documentation

## 2015-01-20 DIAGNOSIS — Z7901 Long term (current) use of anticoagulants: Secondary | ICD-10-CM | POA: Diagnosis not present

## 2015-01-20 DIAGNOSIS — I2510 Atherosclerotic heart disease of native coronary artery without angina pectoris: Secondary | ICD-10-CM | POA: Diagnosis not present

## 2015-01-20 DIAGNOSIS — E785 Hyperlipidemia, unspecified: Secondary | ICD-10-CM | POA: Diagnosis not present

## 2015-01-20 DIAGNOSIS — Z79899 Other long term (current) drug therapy: Secondary | ICD-10-CM | POA: Insufficient documentation

## 2015-01-20 DIAGNOSIS — E1140 Type 2 diabetes mellitus with diabetic neuropathy, unspecified: Secondary | ICD-10-CM | POA: Insufficient documentation

## 2015-01-20 DIAGNOSIS — Z8546 Personal history of malignant neoplasm of prostate: Secondary | ICD-10-CM | POA: Diagnosis not present

## 2015-01-20 DIAGNOSIS — Z5189 Encounter for other specified aftercare: Secondary | ICD-10-CM | POA: Diagnosis present

## 2015-01-20 LAB — POCT URINALYSIS DIPSTICK
Bilirubin, UA: NEGATIVE
Glucose, UA: 500
KETONES UA: NEGATIVE
LEUKOCYTES UA: NEGATIVE
NITRITE UA: NEGATIVE
PH UA: 5
Spec Grav, UA: 1.01
UROBILINOGEN UA: 0.2

## 2015-01-20 LAB — POCT INR: INR: 1.2

## 2015-01-20 LAB — POCT GLYCOSYLATED HEMOGLOBIN (HGB A1C): HEMOGLOBIN A1C: 11.2

## 2015-01-20 LAB — GLUCOSE, POCT (MANUAL RESULT ENTRY): POC Glucose: 350 mg/dl — AB (ref 70–99)

## 2015-01-20 MED ORDER — ATORVASTATIN CALCIUM 80 MG PO TABS: 80.0000 mg | ORAL_TABLET | Freq: Every day | ORAL | Status: DC

## 2015-01-20 MED ORDER — CARVEDILOL 6.25 MG PO TABS: 6.2500 mg | ORAL_TABLET | Freq: Two times a day (BID) | ORAL | Status: DC

## 2015-01-20 MED ORDER — OMEPRAZOLE 20 MG PO CPDR: 20.0000 mg | DELAYED_RELEASE_CAPSULE | Freq: Every day | ORAL | Status: DC

## 2015-01-20 MED ORDER — INSULIN ASPART 100 UNIT/ML ~~LOC~~ SOLN
10.0000 [IU] | Freq: Once | SUBCUTANEOUS | Status: AC
  Administered 2015-01-20: 10 [IU] via SUBCUTANEOUS

## 2015-01-20 MED ORDER — LOSARTAN POTASSIUM 50 MG PO TABS: 50.0000 mg | ORAL_TABLET | Freq: Every day | ORAL | Status: DC

## 2015-01-20 NOTE — Progress Notes (Signed)
Patient ID: Thomas Sosa, male   DOB: 26-Nov-1955, 59 y.o.   MRN: [id number]  CC: HFU  HPI: Thomas Sosa is a 59 y.o. male here today for a follow up visit.  Patient has past medical history of diabetes, HTN, CAD, embolic stroke, tobacco use. Patient reports that he was admitted into [facility] in [place] with a acute CVA and a sub-therapeutic INR on 10/11-10/13. He reports that he has been taking Warfarin daily but review of charts reveal that patient has not had a therapeutic level in several months. Patients is present with a friend who reports before the event he fell. When attempting to get patient up, he had slurred speech, confusion, and limited mobility of left side. Patient states that he was out of Warfarin for 3 weeks before the repeat stroke recently. Today he feels better but does not some left sided weakness.   Patient has No headache, No chest pain, No abdominal pain - No Nausea, No new weakness tingling or numbness, No Cough - SOB.  Allergies  Allergen Reactions  . Lisinopril Cough   Past Medical History  Diagnosis Date  . Hypertension   . Diabetes mellitus without complication ([place])   . Diabetic neuropathy ([place]) Dx 2011  . Cancer of prostate ([place]) Dx 2005  . Hyperlipidemia   . Coronary artery disease   . Tobacco abuse   . Peripheral arterial disease ([place])   . Stroke [facility])    Current Outpatient Prescriptions on File Prior to Visit  Medication Sig Dispense Refill  . aspirin 81 MG EC tablet Take 1 tablet (81 mg total) by mouth daily. 30 tablet 0  . atorvastatin (LIPITOR) 80 MG tablet Take 1 tablet (80 mg total) by mouth daily. 30 tablet 0  . carvedilol (COREG) 6.25 MG tablet Take 1 tablet (6.25 mg total) by mouth 2 (two) times daily with a meal. 60 tablet 0  . gabapentin (NEURONTIN) 600 MG tablet take 1 and 1/2 tablets by mouth three times a day 135 tablet 1  . glipiZIDE (GLUCOTROL XL) 5 MG 24 hr tablet Take 1 tablet (5 mg total) by mouth daily with breakfast. 30  tablet 2  . insulin aspart (NOVOLOG) 100 UNIT/ML injection Inject 10 Units into the skin 3 (three) times daily before meals. < 400 = 14 units; > 400 = call MD 10 mL 11  . insulin detemir (LEVEMIR) 100 UNIT/ML injection Inject 0.35 mLs (35 Units total) into the skin 2 (two) times daily. 10 mL 11  . losartan (COZAAR) 50 MG tablet Take 1 tablet (50 mg total) by mouth daily. 30 tablet 1  . omeprazole (PRILOSEC) 20 MG capsule Take 1 capsule (20 mg total) by mouth daily. Acid reflux 30 capsule 4  . sertraline (ZOLOFT) 50 MG tablet Take 1 tablet (50 mg total) by mouth daily. For depression 30 tablet 3  . warfarin (COUMADIN) 5 MG tablet Take 1 tablet (5 mg total) by mouth daily. 30 tablet 3  . clopidogrel (PLAVIX) 75 MG tablet Take 1 tablet (75 mg total) by mouth daily. (Patient not taking: Reported on 11/10/2014) 30 tablet 2  . HYDROcodone-acetaminophen (NORCO/VICODIN) 5-325 MG tablet Take 1-2 tablets by mouth every 6 (six) hours as needed for moderate pain. 14 tablet 0  . ONE TOUCH ULTRA TEST test strip TEST four times a day 100 each 6  . penicillin v potassium (VEETID) 500 MG tablet Take 1 tablet (500 mg total) by mouth 4 (four) times daily. 40 tablet 0  . polyethylene  glycol (MIRALAX / GLYCOLAX) packet Take 17 g by mouth 2 (two) times daily. 14 each 0  . traMADol (ULTRAM) 50 MG tablet Take 2 tablets (100 mg total) by mouth every 6 (six) hours as needed for moderate pain. 30 tablet 0   No current facility-administered medications on file prior to visit.   Family History  Problem Relation Age of Onset  . Heart Problems Mother   . Heart Problems Father    Social History   Social History  . Marital Status: Married    Spouse Name: N/A  . Number of Children: N/A  . Years of Education: N/A   Occupational History  . Not on file.   Social History Main Topics  . Smoking status: Former Smoker    Quit date: 03/26/2014  . Smokeless tobacco: Not on file  . Alcohol Use: No  . Drug Use: Not on file   . Sexual Activity: Not on file   Other Topics Concern  . Not on file   Social History Narrative    Review of Systems: Other than what is stated in HPI, all other systems are negative.   Objective:   Filed Vitals:   01/20/15 1430  BP: 121/82  Pulse: 93  Temp: 98 F (36.7 C)  Resp: 16    Physical Exam  Neck: No JVD present.  Cardiovascular: Normal rate, regular rhythm and normal heart sounds.   Pulses:      Dorsalis pedis pulses are 2+ on the right side, and 2+ on the left side.       Posterior tibial pulses are 2+ on the right side, and 2+ on the left side.  Pulmonary/Chest: Effort normal and breath sounds normal.  Musculoskeletal: He exhibits no edema.  Left sided weakness  Feet:  Right Foot:  Skin Integrity: Negative for skin breakdown.  Left Foot:  Skin Integrity: Negative for skin breakdown.  Neurological:  Mild slurred speech  Skin: Skin is warm and dry.  Psychiatric: He has a normal mood and affect.     Lab Results  Component Value Date   WBC 7.5 01/16/2015   HGB 12.8* 01/16/2015   HCT 37.5* 01/16/2015   MCV 88.4 01/16/2015   PLT 261 01/16/2015   Lab Results  Component Value Date   CREATININE 0.86 01/16/2015   BUN 10 01/16/2015   NA 136 01/16/2015   K 3.8 01/16/2015   CL 102 01/16/2015   CO2 26 01/16/2015    Lab Results  Component Value Date   HGBA1C 11.20 01/20/2015   Lipid Panel     Component Value Date/Time   CHOL 179 10/05/2014 0540   TRIG 253* 10/05/2014 0540   HDL 28* 10/05/2014 0540   CHOLHDL 6.4 10/05/2014 0540   VLDL 51* 10/05/2014 0540   LDLCALC 100* 10/05/2014 0540       Assessment and plan:   Thomas Sosa was seen today for follow-up.  Diagnoses and all orders for this visit:  Uncontrolled type 2 diabetes mellitus with complication, with long-term current use of insulin (HCC) -     Glucose (CBG) -     HgB A1c -     insulin aspart (novoLOG) injection 10 Units; Inject 0.1 mLs (10 Units total) into the skin once in office  for hyperglycemia. -     Urinalysis Dipstick -     Flu Vaccine QUAD 36+ mos PF IM (Fluarix & Fluzone Quad PF) -     Ambulatory referral to Endocrinology Patient's sugars are uncontrolled, I believe  it is time for him to be referred to Endocrinology. I do have high suspicion that patient is non compliant. Therefore I have stressed to patient the serious consequences of uncontrolled diabetes on his health and how it may cause serious damage to his kidneys as well as place him at even further risk for repeat CVA and MI's.   Acute embolic stroke (HCC) -     INR -     atorvastatin (LIPITOR) 80 MG tablet; Take 1 tablet (80 mg total) by mouth daily. Again patient was sub-therapeutic. I have stressed over and over again to patient the need to keep INR check appointments and how non-compliance can lead to more strokes. Pharm D will come in and set new coumadin dose for patient and make his return appointment. I stressed the seriousness of this and how it can ultimately lead to death.   Essential hypertension -     carvedilol (COREG) 6.25 MG tablet; Take 1 tablet (6.25 mg total) by mouth 2 (two) times daily with a meal. -     losartan (COZAAR) 50 MG tablet; Take 1 tablet (50 mg total) by mouth daily. Patient blood pressure is stable and may continue on current medication.  Education on diet, exercise, and modifiable risk factors discussed. Will obtain appropriate labs as needed. Will follow up in 3-6 months.   Gastroesophageal reflux disease, esophagitis presence not specified -     omeprazole (PRILOSEC) 20 MG capsule; Take 1 capsule (20 mg total) by mouth daily. Acid reflux  Pain, dental -     Ambulatory referral to Dentistry   Return in about 3 months (around 04/22/2015) for DM/HTN and 1 week coumadin clinic with Stacy .       [name], [place] [facility] [phone] 01/20/2015, 2:40 PM'

## 2015-01-20 NOTE — Progress Notes (Signed)
Patient followed up in Coumadin Clinic after a repeat stroke from subtherapeutic INR after being noncompliant with follow up in clinic for warfarin dose adjustment.  Stressed the importance of compliance with Coumadin Clinic visits and to follow up with me so that we can adjust his warfarin dose to target goal of 2-3.  Also re-educated on the risks of noncompliance with visits, including fatal stroke.  Patient verbalized understanding and reported that he would be compliance with his clinic appointments.

## 2015-01-20 NOTE — Patient Instructions (Signed)
Please take your medication.  Endocrinology will call you about your diabetes  Dentist will call you soon

## 2015-01-20 NOTE — Progress Notes (Signed)
Patient here for follow up from the ED Was seen for elevated blood pressure Today presents in office with elevated blood sugar 10units novolog given per office protocol

## 2015-01-23 ENCOUNTER — Emergency Department (HOSPITAL_COMMUNITY): Payer: Medicare HMO

## 2015-01-23 ENCOUNTER — Observation Stay (HOSPITAL_COMMUNITY)
Admission: EM | Admit: 2015-01-23 | Discharge: 2015-01-24 | Disposition: A | Discharge status: Home / Self Care | Payer: Medicare HMO | Attending: Internal Medicine | Admitting: Internal Medicine

## 2015-01-23 ENCOUNTER — Encounter (HOSPITAL_COMMUNITY): Payer: Self-pay | Admitting: Emergency Medicine

## 2015-01-23 DIAGNOSIS — Z794 Long term (current) use of insulin: Secondary | ICD-10-CM | POA: Diagnosis not present

## 2015-01-23 DIAGNOSIS — I5020 Unspecified systolic (congestive) heart failure: Secondary | ICD-10-CM | POA: Diagnosis not present

## 2015-01-23 DIAGNOSIS — I5022 Chronic systolic (congestive) heart failure: Secondary | ICD-10-CM | POA: Diagnosis present

## 2015-01-23 DIAGNOSIS — E785 Hyperlipidemia, unspecified: Secondary | ICD-10-CM | POA: Insufficient documentation

## 2015-01-23 DIAGNOSIS — E1140 Type 2 diabetes mellitus with diabetic neuropathy, unspecified: Secondary | ICD-10-CM | POA: Diagnosis not present

## 2015-01-23 DIAGNOSIS — K047 Periapical abscess without sinus: Secondary | ICD-10-CM | POA: Insufficient documentation

## 2015-01-23 DIAGNOSIS — G8929 Other chronic pain: Secondary | ICD-10-CM | POA: Diagnosis not present

## 2015-01-23 DIAGNOSIS — R0789 Other chest pain: Secondary | ICD-10-CM | POA: Diagnosis present

## 2015-01-23 DIAGNOSIS — H538 Other visual disturbances: Secondary | ICD-10-CM | POA: Insufficient documentation

## 2015-01-23 DIAGNOSIS — Z888 Allergy status to other drugs, medicaments and biological substances status: Secondary | ICD-10-CM | POA: Diagnosis not present

## 2015-01-23 DIAGNOSIS — I10 Essential (primary) hypertension: Secondary | ICD-10-CM | POA: Diagnosis present

## 2015-01-23 DIAGNOSIS — E1142 Type 2 diabetes mellitus with diabetic polyneuropathy: Secondary | ICD-10-CM | POA: Diagnosis present

## 2015-01-23 DIAGNOSIS — Z87891 Personal history of nicotine dependence: Secondary | ICD-10-CM | POA: Diagnosis not present

## 2015-01-23 DIAGNOSIS — Z8673 Personal history of transient ischemic attack (TIA), and cerebral infarction without residual deficits: Secondary | ICD-10-CM | POA: Insufficient documentation

## 2015-01-23 DIAGNOSIS — Z95810 Presence of automatic (implantable) cardiac defibrillator: Secondary | ICD-10-CM | POA: Diagnosis not present

## 2015-01-23 DIAGNOSIS — I110 Hypertensive heart disease with heart failure: Secondary | ICD-10-CM | POA: Diagnosis not present

## 2015-01-23 DIAGNOSIS — Z951 Presence of aortocoronary bypass graft: Secondary | ICD-10-CM | POA: Insufficient documentation

## 2015-01-23 DIAGNOSIS — R0602 Shortness of breath: Secondary | ICD-10-CM | POA: Diagnosis not present

## 2015-01-23 DIAGNOSIS — Z7901 Long term (current) use of anticoagulants: Secondary | ICD-10-CM | POA: Insufficient documentation

## 2015-01-23 DIAGNOSIS — R079 Chest pain, unspecified: Secondary | ICD-10-CM | POA: Diagnosis present

## 2015-01-23 DIAGNOSIS — E1151 Type 2 diabetes mellitus with diabetic peripheral angiopathy without gangrene: Secondary | ICD-10-CM | POA: Insufficient documentation

## 2015-01-23 DIAGNOSIS — R51 Headache: Secondary | ICD-10-CM | POA: Insufficient documentation

## 2015-01-23 DIAGNOSIS — Z7982 Long term (current) use of aspirin: Secondary | ICD-10-CM | POA: Insufficient documentation

## 2015-01-23 DIAGNOSIS — I2510 Atherosclerotic heart disease of native coronary artery without angina pectoris: Secondary | ICD-10-CM | POA: Insufficient documentation

## 2015-01-23 LAB — CBC WITH DIFFERENTIAL/PLATELET
BASOS PCT: 0 %
Basophils Absolute: 0 10*3/uL (ref 0.0–0.1)
Eosinophils Absolute: 0.4 10*3/uL (ref 0.0–0.7)
Eosinophils Relative: 5 %
HEMATOCRIT: 35.2 % — AB (ref 39.0–52.0)
HEMOGLOBIN: 12 g/dL — AB (ref 13.0–17.0)
LYMPHS ABS: 2 10*3/uL (ref 0.7–4.0)
LYMPHS PCT: 30 %
MCH: 30.1 pg (ref 26.0–34.0)
MCHC: 34.1 g/dL (ref 30.0–36.0)
MCV: 88.2 fL (ref 78.0–100.0)
MONO ABS: 0.6 10*3/uL (ref 0.1–1.0)
MONOS PCT: 9 %
NEUTROS ABS: 3.8 10*3/uL (ref 1.7–7.7)
Neutrophils Relative %: 56 %
Platelets: 233 10*3/uL (ref 150–400)
RBC: 3.99 MIL/uL — ABNORMAL LOW (ref 4.22–5.81)
RDW: 13.4 % (ref 11.5–15.5)
WBC: 6.7 10*3/uL (ref 4.0–10.5)

## 2015-01-23 LAB — COMPREHENSIVE METABOLIC PANEL
ALT: 18 U/L (ref 17–63)
AST: 18 U/L (ref 15–41)
Albumin: 2.7 g/dL — ABNORMAL LOW (ref 3.5–5.0)
Alkaline Phosphatase: 76 U/L (ref 38–126)
Anion gap: 12 (ref 5–15)
BUN: 14 mg/dL (ref 6–20)
CO2: 27 mmol/L (ref 22–32)
Calcium: 8.7 mg/dL — ABNORMAL LOW (ref 8.9–10.3)
Chloride: 99 mmol/L — ABNORMAL LOW (ref 101–111)
Creatinine, Ser: 0.91 mg/dL (ref 0.61–1.24)
GFR calc Af Amer: >60 mL/min (ref >60)
GFR calc non Af Amer: >60 mL/min (ref >60)
Glucose, Bld: 356 mg/dL — ABNORMAL HIGH (ref 65–99)
Potassium: 3.8 mmol/L (ref 3.5–5.1)
Sodium: 138 mmol/L (ref 135–145)
Total Bilirubin: 0.4 mg/dL (ref 0.3–1.2)
Total Protein: 6.6 g/dL (ref 6.5–8.1)

## 2015-01-23 LAB — LIPASE, BLOOD: LIPASE: 21 U/L (ref 11–51)

## 2015-01-23 LAB — GLUCOSE, CAPILLARY
GLUCOSE-CAPILLARY: 276 mg/dL — AB (ref 65–99)
Glucose-Capillary: 274 mg/dL — ABNORMAL HIGH (ref 65–99)

## 2015-01-23 LAB — TROPONIN I
Troponin I: <0.03 ng/mL (ref <0.031)
Troponin I: 0.05 ng/mL — ABNORMAL HIGH (ref <0.031)
Troponin I: <0.03 ng/mL (ref <0.031)

## 2015-01-23 LAB — PROTIME-INR
INR: 1.58 — ABNORMAL HIGH (ref 0.00–1.49)
Prothrombin Time: 18.9 seconds — ABNORMAL HIGH (ref 11.6–15.2)

## 2015-01-23 MED ORDER — GABAPENTIN 600 MG PO TABS
600.0000 mg | ORAL_TABLET | Freq: Three times a day (TID) | ORAL | Status: DC
  Administered 2015-01-23 – 2015-01-24 (×4): 600 mg via ORAL
  Filled 2015-01-23 (×4): qty 1

## 2015-01-23 MED ORDER — INSULIN ASPART 100 UNIT/ML ~~LOC~~ SOLN
0.0000 [IU] | Freq: Three times a day (TID) | SUBCUTANEOUS | Status: DC
  Administered 2015-01-23 (×2): 8 [IU] via SUBCUTANEOUS
  Administered 2015-01-24: 3 [IU] via SUBCUTANEOUS

## 2015-01-23 MED ORDER — NITROGLYCERIN 2 % TD OINT
1.0000 [in_us] | TOPICAL_OINTMENT | Freq: Once | TRANSDERMAL | Status: AC
  Administered 2015-01-23: 1 [in_us] via TOPICAL
  Filled 2015-01-23: qty 2

## 2015-01-23 MED ORDER — AMITRIPTYLINE HCL 50 MG PO TABS
50.0000 mg | ORAL_TABLET | Freq: Every day | ORAL | Status: DC
  Administered 2015-01-23: 50 mg via ORAL
  Filled 2015-01-23 (×2): qty 1

## 2015-01-23 MED ORDER — LOSARTAN POTASSIUM 50 MG PO TABS
50.0000 mg | ORAL_TABLET | Freq: Every day | ORAL | Status: DC
  Administered 2015-01-23 – 2015-01-24 (×2): 50 mg via ORAL
  Filled 2015-01-23 (×2): qty 1

## 2015-01-23 MED ORDER — ACETAMINOPHEN 325 MG PO TABS
650.0000 mg | ORAL_TABLET | Freq: Once | ORAL | Status: AC
Start: 2015-01-23 — End: 2015-01-24
  Administered 2015-01-24: 650 mg via ORAL
  Filled 2015-01-23: qty 2

## 2015-01-23 MED ORDER — TRAMADOL HCL 50 MG PO TABS
50.0000 mg | ORAL_TABLET | Freq: Four times a day (QID) | ORAL | Status: DC | PRN
  Administered 2015-01-23: 50 mg via ORAL
  Filled 2015-01-23: qty 1

## 2015-01-23 MED ORDER — CARVEDILOL 6.25 MG PO TABS
6.2500 mg | ORAL_TABLET | Freq: Two times a day (BID) | ORAL | Status: DC
  Administered 2015-01-23 – 2015-01-24 (×2): 6.25 mg via ORAL
  Filled 2015-01-23 (×2): qty 1

## 2015-01-23 MED ORDER — INSULIN ASPART 100 UNIT/ML ~~LOC~~ SOLN
5.0000 [IU] | Freq: Three times a day (TID) | SUBCUTANEOUS | Status: DC
  Administered 2015-01-23 (×2): 5 [IU] via SUBCUTANEOUS

## 2015-01-23 MED ORDER — WARFARIN - PHARMACIST DOSING INPATIENT
Freq: Every day | Status: DC
  Administered 2015-01-23: 17:00:00

## 2015-01-23 MED ORDER — WARFARIN SODIUM 7.5 MG PO TABS
7.5000 mg | ORAL_TABLET | Freq: Once | ORAL | Status: AC
  Administered 2015-01-23: 7.5 mg via ORAL
  Filled 2015-01-23 (×2): qty 1

## 2015-01-23 MED ORDER — INSULIN ASPART 100 UNIT/ML ~~LOC~~ SOLN
0.0000 [IU] | Freq: Every day | SUBCUTANEOUS | Status: DC
Start: 2015-01-23 — End: 2015-01-24
  Administered 2015-01-24: 2 [IU] via SUBCUTANEOUS

## 2015-01-23 MED ORDER — SODIUM CHLORIDE 0.9 % IJ SOLN
3.0000 mL | Freq: Two times a day (BID) | INTRAMUSCULAR | Status: DC
  Administered 2015-01-23 – 2015-01-24 (×2): 3 mL via INTRAVENOUS

## 2015-01-23 MED ORDER — INSULIN DETEMIR 100 UNIT/ML ~~LOC~~ SOLN
25.0000 [IU] | Freq: Two times a day (BID) | SUBCUTANEOUS | Status: DC
  Administered 2015-01-23 (×2): 25 [IU] via SUBCUTANEOUS
  Filled 2015-01-23 (×3): qty 0.25

## 2015-01-23 MED ORDER — PENICILLIN V POTASSIUM 500 MG PO TABS
500.0000 mg | ORAL_TABLET | Freq: Four times a day (QID) | ORAL | Status: DC
  Administered 2015-01-23 – 2015-01-24 (×4): 500 mg via ORAL
  Filled 2015-01-23 (×8): qty 1

## 2015-01-23 MED ORDER — ATORVASTATIN CALCIUM 80 MG PO TABS
80.0000 mg | ORAL_TABLET | Freq: Every day | ORAL | Status: DC
  Administered 2015-01-23 – 2015-01-24 (×2): 80 mg via ORAL
  Filled 2015-01-23 (×2): qty 1

## 2015-01-23 MED ORDER — CLOPIDOGREL BISULFATE 75 MG PO TABS
75.0000 mg | ORAL_TABLET | Freq: Every day | ORAL | Status: DC
  Administered 2015-01-23 – 2015-01-24 (×2): 75 mg via ORAL
  Filled 2015-01-23 (×2): qty 1

## 2015-01-23 MED ORDER — ASPIRIN EC 81 MG PO TBEC
81.0000 mg | DELAYED_RELEASE_TABLET | Freq: Every day | ORAL | Status: DC
  Administered 2015-01-24: 81 mg via ORAL
  Filled 2015-01-23 (×3): qty 1

## 2015-01-23 MED ORDER — POLYETHYLENE GLYCOL 3350 17 G PO PACK: 17.0000 g | PACK | Freq: Every day | ORAL | Status: DC | PRN

## 2015-01-23 MED ORDER — PANTOPRAZOLE SODIUM 40 MG PO TBEC
40.0000 mg | DELAYED_RELEASE_TABLET | Freq: Every day | ORAL | Status: DC
  Administered 2015-01-23 – 2015-01-24 (×2): 40 mg via ORAL
  Filled 2015-01-23 (×2): qty 1

## 2015-01-23 NOTE — Progress Notes (Signed)
ANTICOAGULATION CONSULT NOTE - Initial Consult  Pharmacy Consult for Coumadin Indication: h/o CVA  Allergies  Allergen Reactions  . Lisinopril Cough    Patient Measurements:    Vital Signs: Temp: 98.9 F (37.2 C) (10/30 0354) Temp Source: Oral (10/30 0354) BP: 151/72 mmHg (10/30 0815) Pulse Rate: 70 (10/30 0830)  Labs:  Recent Labs  01/20/15 1514 01/23/15 0530  HGB  --  12.0*  HCT  --  35.2*  PLT  --  233  INR 1.20  --   CREATININE  --  0.91  TROPONINI  --  <0.03    Estimated Creatinine Clearance: 112.2 mL/min (by C-G formula based on Cr of 0.91).   Medical History: Past Medical History  Diagnosis Date  . Hypertension   . Diabetes mellitus without complication ([place])   . Diabetic neuropathy ([place]) Dx 2011  . Cancer of prostate ([place]) Dx 2005  . Hyperlipidemia   . Coronary artery disease   . Tobacco abuse   . Peripheral arterial disease ([place])   . Stroke [facility])     Medications: f/u med rec  Assessment: 59 y/o M presents to [facility] ED with CP. He has h/o CAD with ongoing tobacco use, uncontrolled DM (A1c of 11.2 on 01/20/2015) , and multiple CVA(s). Noncompliance noted. Glucose 356. Albumin only 2.7. LFT's WNL. Scr 0.91. CBC WNL  Recent office note 10/27: Patient reports admitted to [facility] in [place] with a acute CVA (ran out of warfarin x 3 wks) and a sub-therapeutic INR on 10/11-10/13.  Anticoagulation: h/o multiple CVA's. INR in office 10/27 only 1.2. CBC WNL. INR today 1.58. - Home dose: 5mg  MWTh, 7.5,g TFSS  Goal of Therapy:  INR 2-3 Monitor platelets by anticoagulation protocol: Yes   Plan:  Cycle troponins Coumadin 7.5mg  po 1 tonight.   Yeshaya Vath S. Alford Highland, PharmD, BCPS Clinical Staff Pharmacist Pager [phone]  Eilene Ghazi Stillinger 01/23/2015,8:43 AM

## 2015-01-23 NOTE — ED Notes (Addendum)
Pt stated that he was watching a movie when he had an acute onset of sharp chest pain. Pt denies SOB, but had some N/V. Pt with extensive cardiac history and is diabetic. Pt was given two nitro tablets by EMS prior to ER arrival. Pt states he has very little chest pain at this time.

## 2015-01-23 NOTE — H&P (Signed)
Date: 01/23/2015               Patient Name:  Thomas Sosa MRN: [id number]  DOB: [birth year] Age / Sex: 59 y.o., male   PCP: [name], NP         Medical Service: Internal Medicine Teaching Service         Attending Physician: Dr. Sid Falcon, MD    First Contact: Dr. Burgess Estelle Pager: [phone]  Second Contact: Dr. 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  Pt said that last night he was watching TV at 2:30 AM and then he noticed sharp, substernal, nonradiating 7/10 chest pain that lasted until 5:30 AM. He has never had chest pain like this before, though he has had several MIs.. At 2:30, he also checked his BP and it was 189/119 and then 220/117 that night. Pt took 4 aspirin per the 911 instructor. Pt has nitroglycerin at home but did not take it as he was not sure. Then he called EMS and he got the nitroglycerin and his chest pain was relieved.He said that it was a normal day for him and he took all his meds, except the night time carvedilol.  He did not notice that his ICD had fired. Also he was not hypoglycemic.  Pt denies any fevers, or systemic symptoms. He does endorse some blurry vision, and headache but no LOC. He has chronic shortness of breath/presumed COPD for which he uses inhalers, and is not on home oxygen. Does have diabetic neuropathy and decreased sensation in feet.   Also, he was recently admitted in a hospital in [place] when he  was visiting his son as daughter thought he was having a stroke again but the workup was negative, and he was asked to follow up here.  In the ER, his BP was improved, his EKG was unchanged from prior and CXR was negative. 1st troponin was normal.  Currently on admission, he was free of chest pain. His BP was 149/110. He denies any weakness, numbness, LOC, or any neurological symptoms.  FH: of diabetes and cardiac history- brother had MI at 31 [place]: ex-smoker quit in January. Has 40 year history of 1/2 ppd, No alcohol and no illicit drugs    Meds: No current facility-administered medications for this encounter.   Current Outpatient Prescriptions  Medication Sig Dispense Refill  . amitriptyline (ELAVIL) 50 MG tablet Take 50 mg by mouth at bedtime.    [name] aspirin 81 MG EC tablet Take 1 tablet (81 mg total) by mouth daily. 30 tablet 0  . atorvastatin (LIPITOR) 80 MG tablet Take 1 tablet (80 mg total) by mouth daily. 30 tablet 4  . carvedilol (COREG) 6.25 MG tablet Take 1 tablet (6.25 mg total) by mouth 2 (two) times daily with a meal. 60 tablet 4  . gabapentin (NEURONTIN)  600 MG tablet take 1 and 1/2 tablets by mouth three times a day 135 tablet 1  . glipiZIDE (GLUCOTROL XL) 5 MG 24 hr tablet Take 1 tablet (5 mg total) by mouth daily with breakfast. 30 tablet 2  . HYDROcodone-acetaminophen (NORCO/VICODIN) 5-325 MG tablet Take 1-2 tablets by mouth every 6 (six) hours as needed for moderate pain. 14 tablet 0  . insulin aspart (NOVOLOG) 100 UNIT/ML injection Inject 10 Units into the skin 3 (three) times daily before meals. < 400 = 14 units; > 400 = call MD 10 mL 11  . insulin detemir (LEVEMIR) 100 UNIT/ML injection Inject 0.35 mLs (35 Units total) into the skin 2 (two) times daily. 10 mL 11  . losartan (COZAAR) 50 MG tablet Take 1 tablet (50 mg total) by mouth daily. 30 tablet 4  . omeprazole (PRILOSEC) 20 MG capsule Take 1 capsule (20 mg total) by mouth daily. Acid reflux 30 capsule 4  . ONE TOUCH  ULTRA TEST test strip TEST four times a day 100 each 6  . penicillin v potassium (VEETID) 500 MG tablet Take 1 tablet (500 mg total) by mouth 4 (four) times daily. 40 tablet 0  . polyethylene glycol (MIRALAX / GLYCOLAX) packet Take 17 g by mouth 2 (two) times daily. (Patient taking differently: Take 17 g by mouth daily as needed for mild constipation. ) 14 each 0  . traMADol (ULTRAM) 50 MG tablet Take 2 tablets (100 mg total) by mouth every 6 (six) hours as needed for moderate pain. 30 tablet 0  . warfarin (COUMADIN) 5 MG tablet Take 1 tablet (5 mg total) by mouth daily. (Patient taking differently: Take 5-7.5 mg by mouth daily. Take 5mg  everyday except Monday, Wednesday and Thursday take 7.5mg ) 30 tablet 3  . clopidogrel (PLAVIX) 75 MG tablet Take 1 tablet (75 mg total) by mouth daily. (Patient not taking: Reported on 11/10/2014) 30 tablet 2    Allergies: Allergies as of 01/23/2015 - Review Complete 01/23/2015  Allergen Reaction Noted  . Lisinopril Cough 11/10/2014   Past Medical History  Diagnosis Date  . Hypertension   . Diabetes mellitus without complication ([place])   . Diabetic neuropathy ([place]) Dx 2011  . Cancer of prostate ([place]) Dx 2005  . Hyperlipidemia   . Coronary artery disease   . Tobacco abuse   . Peripheral arterial disease ([place])   . Stroke [facility])    Past Surgical History  Procedure Laterality Date  . Replacement total knee  Lt 2013/ Rt 2005  . Ep implantable device N/A 10/13/2014    Procedure: ICD Implant;  Surgeon: Deboraha Sprang, MD;  Location: [facility] LAB;  Service: Cardiovascular;  Laterality: N/A;  . Tee without cardioversion N/A 10/11/2014    Procedure: TRANSESOPHAGEAL ECHOCARDIOGRAM (TEE);  Surgeon: Dorothy Spark, MD;  Location: [facility] ENDOSCOPY;  Service: Cardiovascular;  Laterality: N/A;   Family History  Problem Relation Age of Onset  . Heart Problems Mother   . Heart Problems Father    Social History   Social History  . Marital Status: Married     Spouse Name: N/A  . Number of Children: N/A  . Years of Education: N/A   Occupational History  . Not on file.   Social History Main Topics  . Smoking status: Former Smoker    Quit date: 03/26/2014  . Smokeless tobacco: Not on file  . Alcohol Use: No  . Drug Use: Not on file  . Sexual Activity: Not on file  Other Topics Concern  . Not on file   Social History Narrative    Review of Systems: ROS: per HPI, others below  General: no fevers, chills, changes in weight, changes in appetite Skin: no rash HEENT: +blurry vision, hearing changes, sore throat Pulm: + chronic dyspnea, no coughing, no wheezing CV: + 3 hour chest pain, no palpitations, + chronic shortness of breath Abd: no abdominal pain, nausea/vomiting, diarrhea/constipation GU: no dysuria, hematuria, polyuria Ext: no arthralgias, myalgias Neuro: NO weakness, numbness, or tingling   Physical Exam: Blood pressure 151/72, pulse 70, temperature 98.9 F (37.2 C), temperature source Oral, resp. rate 21, SpO2 97 %.  General: A&O, in NAD, lying in bed HEENT: EOMI, PERRLA, sclera white, conjunctiva pink, no ear/nose/throat erythema Neck: supple, midline trachea, no cervical LAD, CV: RRR, normal s1, s2, no m/r/g, right but not left carotid bruits appreciated, no JVD appreciated, has a ICD in place  Resp: equal and symmetric breath sounds, no wheezing heard Abdomen: soft, nontender, nondistended, +BS in all 4 quadrants Skin: warm, dry, intact, no open lesions or rashes noted Extremities: pulses intact b/l, no edema, clubbing or cyanosis, no calf tenderness Neurologic: no focal neuro deficits  Sensory: decreased sensation in leg- due to neuropathy. Motor: 5/5 strength in upper, 5/5 in lower extremities   Lab results: Results for orders placed or performed during the hospital encounter of 01/23/15 (from the past 24 hour(s))  Protime-INR     Status: Abnormal   Collection Time: 01/23/15  5:20 AM  Result Value Ref Range     Prothrombin Time 18.9 (H) 11.6 - 15.2 seconds   INR 1.58 (H) 0.00 - 1.49  Troponin I     Status: None   Collection Time: 01/23/15  5:30 AM  Result Value Ref Range   Troponin I <0.03 <0.031 ng/mL  CBC with Differential     Status: Abnormal   Collection Time: 01/23/15  5:30 AM  Result Value Ref Range   WBC 6.7 4.0 - 10.5 K/uL   RBC 3.99 (L) 4.22 - 5.81 MIL/uL   Hemoglobin 12.0 (L) 13.0 - 17.0 g/dL   HCT 35.2 (L) 39.0 - 52.0 %   MCV 88.2 78.0 - 100.0 fL   MCH 30.1 26.0 - 34.0 pg   MCHC 34.1 30.0 - 36.0 g/dL   RDW 13.4 11.5 - 15.5 %   Platelets 233 150 - 400 K/uL   Neutrophils Relative % 56 %   Neutro Abs 3.8 1.7 - 7.7 K/uL   Lymphocytes Relative 30 %   Lymphs Abs 2.0 0.7 - 4.0 K/uL   Monocytes Relative 9 %   Monocytes Absolute 0.6 0.1 - 1.0 K/uL   Eosinophils Relative 5 %   Eosinophils Absolute 0.4 0.0 - 0.7 K/uL   Basophils Relative 0 %   Basophils Absolute 0.0 0.0 - 0.1 K/uL  Comprehensive metabolic panel     Status: Abnormal   Collection Time: 01/23/15  5:30 AM  Result Value Ref Range   Sodium 138 135 - 145 mmol/L   Potassium 3.8 3.5 - 5.1 mmol/L   Chloride 99 (L) 101 - 111 mmol/L   CO2 27 22 - 32 mmol/L   Glucose, Bld 356 (H) 65 - 99 mg/dL   BUN 14 6 - 20 mg/dL   Creatinine, Ser 0.91 0.61 - 1.24 mg/dL   Calcium 8.7 (L) 8.9 - 10.3 mg/dL   Total Protein 6.6 6.5 - 8.1 g/dL   Albumin 2.7 (L) 3.5 - 5.0 g/dL   AST 18  15 - 41 U/L   ALT 18 17 - 63 U/L   Alkaline Phosphatase 76 38 - 126 U/L   Total Bilirubin 0.4 0.3 - 1.2 mg/dL   GFR calc non Af Amer >60 >60 mL/min   GFR calc Af Amer >60 >60 mL/min   Anion gap 12 5 - 15  Lipase, blood     Status: None   Collection Time: 01/23/15  5:30 AM  Result Value Ref Range   Lipase 21 11 - 51 U/L     Imaging results:  Dg Chest Portable 1 View  01/23/2015  CLINICAL DATA:  Acute onset of generalized chest pain. Initial encounter. EXAM: PORTABLE CHEST 1 VIEW COMPARISON:  Chest radiograph performed 10/14/2014, and CT of the chest  performed 10/04/2014 FINDINGS: The lungs are well-aerated and clear. There is no evidence of focal opacification, pleural effusion or pneumothorax. The cardiomediastinal silhouette is borderline normal in size. An AICD is noted overlying the left chest wall with a single lead ending overlying the right ventricle. The patient is status post median sternotomy. No acute osseous abnormalities are seen. IMPRESSION: No acute cardiopulmonary process seen. Electronically Signed   By: Garald Balding M.D.   On: 01/23/2015 04:48    Other results: Ventricular Rate: 92 PR Interval: 147 QRS Duration: 94 QT Interval: 392 QTC Calculation: 485 R Axis: 4 Text Interpretation: Sinus rhythm LAE, consider biatrial enlargement  Abnormal T, consider ischemia, lateral leads No significant change since   Assessment & Plan by Problem:  Mr. 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 He had sharp nonradiating substernal chest pain for 3 hours which was resolved after nitroglycerin x  2 and aspirin. His chest pain had resolved and EKG did not show any signs of MI. 1st troponin was normal. CXR did not reveal acute cardiopulmonary process. We do not believe that he was having another stroke as he did not have any weakness- A&Ox3 and neuro exam entirely benign except decreased sensation in lower feet which is due to the neuropathy. His hypertension could have led to  the chest pain  -telemetry -trending trops x3 -EKG in AM -already received aspirin, 81 mg daily -coreg 6.25 bid -plavix 75 mg daily  HTN: Pt with BP of 200/100 at home, and 149/120 on admission- very elevated. In the history of CVAs and cardiac history, unsure if the elevated BP caused the chest pain, or vice versa. On home losartan and coreg   -monitor closely, may need to titrate the BP -losartan 50 mg daily -coreg 6.25 bid  SCHF:  EF of 25-30% per TEE in July 2016 with subsequent ICD placement in July 2016, -coreg 6.25 bid -losartan -plavix and coumadin  History of CVA now on coumadin- with INR of 1.6 subtherapeutic -coumadin per pharm  IDDM with neuropathy:  lantus 40 units bid and novolog at home,  poorly controlled with A1c of 11.2 on 01/20/2015 -Lantus 25 units bid -novolog 5 units tid -gabapentin  600 tid  HLD: not an acute issue -on atorva 80 daily  Dental abscess: on penicillin -continue penicillin per original prescription  Chronic pain: -on tramadol and gabapentin    Dispo: Disposition is deferred at this time, awaiting improvement of current medical problems. Anticipated discharge in approximately 2 day(s).   The patient does have a current PCP [name], NP) and does need an [facility] hospital follow-up appointment after discharge.  The patient does not have transportation limitations that hinder transportation to clinic appointments.  Signed: Burgess Estelle, MD 01/23/2015, 8:54 AM

## 2015-01-23 NOTE — ED Provider Notes (Signed)
CSN: [id number]     Arrival date & time 01/23/15  4132 History   First MD Initiated Contact with Patient 01/23/15 [phone]     Chief Complaint  Patient presents with  . Chest Pain     (Consider location/radiation/quality/duration/timing/severity/associated sxs/prior Treatment) HPI  This is a 59 year old male with a history of hypertension, diabetes, coronary artery disease status post CABG, stroke who presents with concerns for hypertension and chest pain. Patient reports his blood pressures at home were 200 over 100s. He reports that with this high for several hours. He reports chest pain acute onset that began while he was watching a movie. It was left-sided and nonradiating. He describes it as sharp. He has had similar pain in the past when he has had MIs. He received 2 nitroglycerin tablets by EMS and his pain resolved. Denies any shortness breath or diaphoresis.  Past Medical History  Diagnosis Date  . Hypertension   . Diabetes mellitus without complication ([place])   . Diabetic neuropathy ([place]) Dx 2011  . Cancer of prostate ([place]) Dx 2005  . Hyperlipidemia   . Coronary artery disease   . Tobacco abuse   . Peripheral arterial disease ([place])   . Stroke [facility])    Past Surgical History  Procedure Laterality Date  . Replacement total knee  Lt 2013/ Rt 2005  . Ep implantable device N/A 10/13/2014    Procedure: ICD Implant;  Surgeon: Deboraha Sprang, MD;  Location: [facility] LAB;  Service: Cardiovascular;  Laterality: N/A;  . Tee without cardioversion N/A 10/11/2014    Procedure: TRANSESOPHAGEAL ECHOCARDIOGRAM (TEE);  Surgeon: Dorothy Spark, MD;  Location[facility] ENDOSCOPY;  Service: Cardiovascular;  Laterality: N/A;   Family History  Problem Relation Age of Onset  . Heart Problems Mother   . Heart Problems Father    Social History  Substance Use Topics  . Smoking status: Former Smoker    Quit date: 03/26/2014  . Smokeless tobacco: None  . Alcohol Use: No    Review of Systems   Constitutional: Negative.  Negative for fever.  Respiratory: Positive for chest tightness. Negative for shortness of breath.   Cardiovascular: Positive for chest pain. Negative for leg swelling.  Gastrointestinal: Negative.  Negative for abdominal pain.  Genitourinary: Negative.  Negative for dysuria.  Neurological: Negative for headaches.  All other systems reviewed and are negative.     Allergies  Lisinopril  Home Medications   Prior to Admission medications   Medication Sig Start Date End Date Taking? Authorizing Provider  amitriptyline (ELAVIL) 50 MG tablet Take 50 mg by mouth at bedtime.   Yes Historical Provider, MD  aspirin 81 MG EC tablet Take 1 tablet (81 mg total) by mouth daily. 01/04/15  Yes Tresa Garter, MD  atorvastatin (LIPITOR) 80 MG tablet Take 1 tablet (80 mg total) by mouth daily. 01/20/15  Yes [name], NP  carvedilol (COREG) 6.25 MG tablet Take 1 tablet (6.25 mg total) by mouth 2 (two) times daily with a meal. 01/20/15  Yes [name], NP  gabapentin (NEURONTIN) 600 MG tablet take 1 and 1/2 tablets by mouth three times a day 01/04/15  Yes Olugbemiga E Doreene Burke, MD  glipiZIDE (GLUCOTROL XL) 5 MG 24 hr tablet Take 1 tablet (5 mg total) by mouth daily with breakfast. 05/25/14  Yes [name], NP  HYDROcodone-acetaminophen (NORCO/VICODIN) 5-325 MG tablet Take 1-2 tablets by mouth every 6 (six) hours as needed for moderate pain. 01/16/15  Yes Fredia Sorrow,  MD  insulin aspart (NOVOLOG) 100 UNIT/ML injection Inject 10 Units into the skin 3 (three) times daily before meals. < 400 = 14 units; > 400 = call MD 10/14/14  Yes Belkys A Regalado, MD  insulin detemir (LEVEMIR) 100 UNIT/ML injection Inject 0.35 mLs (35 Units total) into the skin 2 (two) times daily. 11/10/14  Yes [name], NP  losartan (COZAAR) 50 MG tablet Take 1 tablet (50 mg total) by mouth daily. 01/20/15  Yes [name], NP  omeprazole (PRILOSEC) 20 MG capsule Take 1 capsule (20 mg  total) by mouth daily. Acid reflux 01/20/15  Yes [name], NP  ONE TOUCH ULTRA TEST test strip TEST four times a day 06/07/14  Yes [name], NP  penicillin v potassium (VEETID) 500 MG tablet Take 1 tablet (500 mg total) by mouth 4 (four) times daily. 01/16/15  Yes Fredia Sorrow, MD  polyethylene glycol (MIRALAX / GLYCOLAX) packet Take 17 g by mouth 2 (two) times daily. Patient taking differently: Take 17 g by mouth daily as needed for mild constipation.  10/14/14  Yes Belkys A Regalado, MD  traMADol (ULTRAM) 50 MG tablet Take 2 tablets (100 mg total) by mouth every 6 (six) hours as needed for moderate pain. 10/14/14  Yes Belkys A Regalado, MD  warfarin (COUMADIN) 5 MG tablet Take 1 tablet (5 mg total) by mouth daily. Patient taking differently: Take 5-7.5 mg by mouth daily. Take 5mg  everyday except Monday, Wednesday and Thursday take 7.5mg  01/04/15  Yes Olugbemiga E Doreene Burke, MD  clopidogrel (PLAVIX) 75 MG tablet Take 1 tablet (75 mg total) by mouth daily. Patient not taking: Reported on 11/10/2014 10/06/14   Erline Hau, MD   BP 155/77 mmHg  Pulse 76  Temp(Src) 98.9 F (37.2 C) (Oral)  Resp 21  SpO2 98% Physical Exam  Constitutional: He is oriented to person, place, and time.  Chronically ill-appearing, no acute distress  HENT:  Head: Normocephalic and atraumatic.  Cardiovascular: Normal rate and regular rhythm.   Pulmonary/Chest: Effort normal and breath sounds normal. No respiratory distress. He has no wheezes.  ICD palpated  Abdominal: Soft. Bowel sounds are normal. There is no tenderness. There is no rebound.  Musculoskeletal: He exhibits no edema.  Neurological: He is alert and oriented to person, place, and time.  Skin: Skin is warm and dry.  Psychiatric: He has a normal mood and affect.  Nursing note and vitals reviewed.   ED Course  Procedures (including critical care time) Labs Review Labs Reviewed  CBC WITH DIFFERENTIAL/PLATELET - Abnormal; Notable  for the following:    RBC 3.99 (*)    Hemoglobin 12.0 (*)    HCT 35.2 (*)    All other components within normal limits  COMPREHENSIVE METABOLIC PANEL - Abnormal; Notable for the following:    Chloride 99 (*)    Glucose, Bld 356 (*)    Calcium 8.7 (*)    Albumin 2.7 (*)    All other components within normal limits  TROPONIN I  LIPASE, BLOOD  PROTIME-INR    Imaging Review Dg Chest Portable 1 View  01/23/2015  CLINICAL DATA:  Acute onset of generalized chest pain. Initial encounter. EXAM: PORTABLE CHEST 1 VIEW COMPARISON:  Chest radiograph performed 10/14/2014, and CT of the chest performed 10/04/2014 FINDINGS: The lungs are well-aerated and clear. There is no evidence of focal opacification, pleural effusion or pneumothorax. The cardiomediastinal silhouette is borderline normal in size. An AICD is noted overlying the left  chest wall with a single lead ending overlying the right ventricle. The patient is status post median sternotomy. No acute osseous abnormalities are seen. IMPRESSION: No acute cardiopulmonary process seen. Electronically Signed   By: Garald Balding M.D.   On: 01/23/2015 04:48   I have personally reviewed and evaluated these images and lab results as part of my medical decision-making.   EKG Interpretation   Date/Time:  Sunday January 23 2015 03:46:22 EDT Ventricular Rate:  92 PR Interval:  147 QRS Duration: 94 QT Interval:  392 QTC Calculation: 485 R Axis:   4 Text Interpretation:  Sinus rhythm LAE, consider biatrial enlargement  Abnormal T, consider ischemia, lateral leads No significant change since  last tracing Confirmed by Zeplin Aleshire  MD, [place] ([id number]) on 01/23/2015  4:05:14 AM      MDM   Final diagnoses:  Other chest pain    Patient presents with chest pain. Currently chest pain-free. Became chest pain-free after 2 nitroglycerin. Extensive heart history. Just recently moved here from [place] but is followed by cardiology.  Patient was given full  dose aspirin. EKG is unchanged from prior. Chest x-ray is negative. Initial troponin is also negative. Patient blood pressure is improved here. He has had no recurrent chest pain. Given his history, feel he needs admission for formal rule out. [facility]. Will admit to the internal medicine service. Consult cardiology as needed.  Merryl Hacker, MD 01/23/15 [phone]

## 2015-01-24 DIAGNOSIS — R0789 Other chest pain: Secondary | ICD-10-CM

## 2015-01-24 DIAGNOSIS — E1151 Type 2 diabetes mellitus with diabetic peripheral angiopathy without gangrene: Secondary | ICD-10-CM | POA: Diagnosis not present

## 2015-01-24 DIAGNOSIS — E785 Hyperlipidemia, unspecified: Secondary | ICD-10-CM | POA: Diagnosis not present

## 2015-01-24 DIAGNOSIS — I2510 Atherosclerotic heart disease of native coronary artery without angina pectoris: Secondary | ICD-10-CM | POA: Diagnosis not present

## 2015-01-24 LAB — GLUCOSE, CAPILLARY
Glucose-Capillary: 227 mg/dL — ABNORMAL HIGH (ref 65–99)
Glucose-Capillary: 195 mg/dL — ABNORMAL HIGH (ref 65–99)

## 2015-01-24 LAB — CBC
HCT: 35.1 % — ABNORMAL LOW (ref 39.0–52.0)
Hemoglobin: 11.8 g/dL — ABNORMAL LOW (ref 13.0–17.0)
MCH: 29.6 pg (ref 26.0–34.0)
MCHC: 33.6 g/dL (ref 30.0–36.0)
MCV: 88 fL (ref 78.0–100.0)
Platelets: 222 10*3/uL (ref 150–400)
RBC: 3.99 MIL/uL — ABNORMAL LOW (ref 4.22–5.81)
RDW: 13.2 % (ref 11.5–15.5)
WBC: 6.8 10*3/uL (ref 4.0–10.5)

## 2015-01-24 LAB — BASIC METABOLIC PANEL
ANION GAP: 7 (ref 5–15)
BUN: 13 mg/dL (ref 6–20)
CALCIUM: 8.5 mg/dL — AB (ref 8.9–10.3)
CO2: 27 mmol/L (ref 22–32)
CREATININE: 0.76 mg/dL (ref 0.61–1.24)
Chloride: 103 mmol/L (ref 101–111)
GFR calc Af Amer: >60 mL/min (ref >60)
GLUCOSE: 249 mg/dL — AB (ref 65–99)
Potassium: 3.4 mmol/L — ABNORMAL LOW (ref 3.5–5.1)
Sodium: 137 mmol/L (ref 135–145)

## 2015-01-24 LAB — PROTIME-INR
INR: 1.71 — AB (ref 0.00–1.49)
PROTHROMBIN TIME: 20.1 s — AB (ref 11.6–15.2)

## 2015-01-24 MED ORDER — WARFARIN SODIUM 7.5 MG PO TABS: 7.5000 mg | ORAL_TABLET | Freq: Once | ORAL | Status: DC

## 2015-01-24 MED ORDER — POTASSIUM CHLORIDE CRYS ER 20 MEQ PO TBCR
40.0000 meq | EXTENDED_RELEASE_TABLET | Freq: Once | ORAL | Status: AC
  Administered 2015-01-24: 40 meq via ORAL
  Filled 2015-01-24: qty 2

## 2015-01-24 MED ORDER — INSULIN DETEMIR 100 UNIT/ML ~~LOC~~ SOLN
35.0000 [IU] | Freq: Two times a day (BID) | SUBCUTANEOUS | Status: DC
  Administered 2015-01-24: 35 [IU] via SUBCUTANEOUS
  Filled 2015-01-24 (×2): qty 0.35

## 2015-01-24 NOTE — Progress Notes (Signed)
IV removed per discharge order. Discharge instructions given and explained to patient with teach back. Discharged via wheelchair to wife's care with volunteer present.

## 2015-01-24 NOTE — Progress Notes (Signed)
Thomas Sosa for Coumadin Indication: h/o CVA  Allergies  Allergen Reactions  . Lisinopril Cough    Patient Measurements: Height: 5' 11.5" (181.6 cm) Weight: 256 lb 6.4 oz (116.302 kg) IBW/kg (Calculated) : 76.45  Vital Signs: Temp: 97.7 F (36.5 C) (10/31 0456) Temp Source: Oral (10/31 0456) BP: 125/54 mmHg (10/31 0456) Pulse Rate: 75 (10/31 0456)  Labs:  Recent Labs  01/23/15 0520 01/23/15 0530 01/23/15 1243 01/23/15 1841 01/24/15 0356  HGB  --  12.0*  --   --  11.8*  HCT  --  35.2*  --   --  35.1*  PLT  --  233  --   --  222  LABPROT 18.9*  --   --   --  20.1*  INR 1.58*  --   --   --  1.71*  CREATININE  --  0.91  --   --  0.76  TROPONINI  --  <0.03 0.05* <0.03  --     Estimated Creatinine Clearance: 128.3 mL/min (by C-G formula based on Cr of 0.76).   Medical History: Past Medical History  Diagnosis Date  . Hypertension   . Diabetes mellitus without complication ([place])   . Diabetic neuropathy ([place]) Dx 2011  . Cancer of prostate ([place]) Dx 2005  . Hyperlipidemia   . Coronary artery disease   . Tobacco abuse   . Peripheral arterial disease ([place])   . Stroke [facility])    Assessment: 59 y/o M presents to [facility] ED with CP. He has h/o CAD with ongoing tobacco use, uncontrolled DM (A1c of 11.2 on 01/20/2015) , and multiple CVA(s). Noncompliance noted.   Recent office note 10/27: Patient reports admitted to [facility] in [place] with a acute CVA (ran out of warfarin x 3 wks) and a sub-therapeutic INR on 10/11-10/13.  Anticoagulation: h/o multiple CVA's. INR in office 10/27 only 1.2. CBC WNL. INR today 1.71 (trending up) - Home dose: 5mg  MWTh, 7.5,g TFSS  Goal of Therapy:  INR 2-3 Monitor platelets by anticoagulation protocol: Yes   Plan:  Coumadin 7.5mg  po 1 tonight. Follow up AM INR  Thank you Anette Guarneri, PharmD [phone]  01/24/2015,10:12 AM

## 2015-01-24 NOTE — Progress Notes (Signed)
  Date: 01/24/2015  Patient name: Thomas Sosa  Medical record number: [id number]  Date of birth: Aug 20, 1955   I have seen and evaluated Thomas Sosa and discussed their care with the Residency Team.  Briefly, Thomas Sosa is a 59yo man with PMH of HTN, IDDM, HLD, CAD s/p CABG in 2014, PVD, CVA most recently in 2016, thought to be embolic in source, chronic sCHF with ICD in place.  He reports that he developed chest pain while at rest on the morning prior to admission while watching TV.  He took his BP and it was very elevated.  The pain was sharp, substernal, 10/10 on the pain scale and did not move anywhere in his chest.  He reports never having pain like this, even with previous cardiac issues.  He checked his blood pressure and it was elevated to 569V systolic and then 948A systolic.  He took aspirin and EMS gave him nitroglycerin which relieved the chest pain.  He reports to me taking all of his medications like normal.  He denied any fever, chills, recent illness, SOB, diaphoresis.  He did have some nausea with the chest pain.  He reports a recent hospitalization in [place] where he was worked up for stroke, but, according to him the w/u was negative.  He is on coumadin for presumed embolic strokes, no source yet identified.  He has a FH of a brother with MI in his 49s.  He smoked until January of this year.  When I saw him, his chest pain had resolved.   Exam: Gen: Alert, oriented, NAD Eyes: anicteric sclerae, EOMI HENT: Neck supple, no ttp CV: RR, NR, no murmur or JVD.  ICD in place.  Well healed sternotomy scar, pulses intact Pulm: CTAB, no wheezing Abd: Soft, NT, +BS Ext: Thin, warm, dray Neuro: grossly intact, no focal deficit  Pertinent data Na 137 K 3.4 Cr 0.76 WBC 6.8 H/H 11.8/35.1 TnI  < 0.03 --> 0.05 --> < 0.03  INR 171 Glu 249  CXR: No acute process seen  EKG 10/31: ? TWI in V5, V6.  Otherwise unchanged   Assessment and Plan: I have seen and evaluated the patient  as outlined above. I agree with the formulated Assessment and Plan as detailed in the residents' note, with the following changes:   1. Chest pain, atypical with some typical features - In the setting on known CAD, ACS was ruled out with CE X 3 which were negative - Telemetry monitored overnight - AM EKG reviewed, nonspecific changes noted, will need to be repeated as an outpatient - Will set up for outpatient stress test - Aspirin daily - Continue coreg and plavix  2. HTN - Improved on home medications and with resolution of chest pain - Continue home medicatinos  3. H/O CVA - Continue coumadin and plavix  4. IDDM, poorly controlled - Resume home doses of lantus, novolog on discharge  Patient is doing well, no further episodes of pain, ACS has been ruled out with CE.  Will set up for outpatient stress test and PCP follow up and discharge today.   Sid Falcon, MD 10/31/201611:11 AM

## 2015-01-24 NOTE — Care Management Note (Addendum)
Case Management Note  Patient Details  Name: Thomas Sosa MRN: [id number] Date of Birth: 1955-06-14  Subjective/Objective:   59y.o. M admitted 01/23/2015 observation of Atypical CP while at rest. HxnsCHF with ICD.               Action/Plan: Discharge-No discharge needs.   Expected Discharge Date:  01/24/15               Expected Discharge Plan:  Home/Self Care  In-House Referral:     Discharge planning Services     Post Acute Care Choice:    Choice offered to:     DME Arranged:    DME Agency:     HH Arranged:    HH Agency:     Status of Service:  Completed, signed off  Medicare Important Message Given:    Date Medicare IM Given:    Medicare IM give by:    Date Additional Medicare IM Given:    Additional Medicare Important Message give by:     If discussed at [place] of Stay Meetings, dates discussed:    Additional Comments: Outpt Stress Test scheduled by MD, and PCP follow up.   Delrae Sawyers, RN 01/24/2015, 11:37 AM

## 2015-01-24 NOTE — Progress Notes (Signed)
Patient ID: Thomas Sosa, male   DOB: 1955/08/01, 59 y.o.   MRN: [id number]   Subjective: Thomas Sosa felt well this morning, without any complaints. His chest pain completely resolved and he's not feeling short of breath. He says his last stress test was a few months ago and this was normal.  Objective: Vital signs in last 24 hours: Filed Vitals:   01/23/15 1949 01/23/15 2329 01/24/15 0200 01/24/15 0456  BP: 154/78 171/98 137/87 125/54  Pulse: 82 75 71 75  Temp: 98.4 F (36.9 C) 97.8 F (36.6 C)  97.7 F (36.5 C)  TempSrc: Oral Oral  Oral  Resp: 18 20  20   Height:      Weight:    116.302 kg (256 lb 6.4 oz)  SpO2: 100% 99%  100%   General: resting in bed comfortably, appropriately conversational HEENT: no scleral icterus, extra-ocular muscles intact, poor dentition Cardiac: regular rate and rhythm, no rubs, murmurs or gallops Pulm: breathing well, clear to auscultation bilaterally Abd: bowel sounds normal, soft, nondistended, non-tender Ext: warm and well perfused, without pedal edema Lymph: no cervical or supraclavicular lymphadenopathy Skin: no rash, hair, or nail changes Neuro: alert and oriented X3, cranial nerves II-XII grossly intact, moving all extremities well  Lab Results: Basic Metabolic Panel:  Recent Labs Lab 01/23/15 0530 01/24/15 0356  NA 138 137  K 3.8 3.4*  CL 99* 103  CO2 27 27  GLUCOSE 356* 249*  BUN 14 13  CREATININE 0.91 0.76  CALCIUM 8.7* 8.5*   CBC:  Recent Labs Lab 01/23/15 0530 01/24/15 0356  WBC 6.7 6.8  NEUTROABS 3.8  --   HGB 12.0* 11.8*  HCT 35.2* 35.1*  MCV 88.2 88.0  PLT 233 222   Cardiac Enzymes:  Recent Labs Lab 01/23/15 0530 01/23/15 1243 01/23/15 1841  TROPONINI <0.03 0.05* <0.03   Hemoglobin A1C:  Recent Labs Lab 01/20/15 1434  HGBA1C 11.20   Coagulation:  Recent Labs Lab 01/20/15 1514 01/23/15 0520 01/24/15 0356  LABPROT  --  18.9* 20.1*  INR 1.20 1.58* 1.71*    Medications: I have reviewed the  patient's current medications. Scheduled Meds: . amitriptyline  50 mg Oral QHS  . aspirin EC  81 mg Oral Daily  . atorvastatin  80 mg Oral Daily  . carvedilol  6.25 mg Oral BID WC  . clopidogrel  75 mg Oral Daily  . gabapentin  600 mg Oral TID  . insulin aspart  0-15 Units Subcutaneous TID WC  . insulin aspart  0-5 Units Subcutaneous QHS  . insulin aspart  5 Units Subcutaneous TID WC  . insulin detemir  35 Units Subcutaneous BID  . losartan  50 mg Oral Daily  . pantoprazole  40 mg Oral Daily  . penicillin v potassium  500 mg Oral QID  . sodium chloride  3 mL Intravenous Q12H  . Warfarin - Pharmacist Dosing Inpatient   Does not apply q1800   Continuous Infusions:  PRN Meds:.polyethylene glycol, traMADol   Assessment/Plan:  Chest pain in the setting of hypertensive urgency: His chest pain has entirely resolved. The EKG this morning showed new T-wave inversion in an isolated lead but his troponins remain negative and he did not have any chest pain overnight. His pressures have been very well-controlled I suspect his hypertensive urgency was the culprit of his chest pain yesterday. He will likely need a stress test as I do not see any evidence of this in his chart. -Consider stress test as outpatient -Resume home  antihypertensives upon discharge  History of cerebrovascular accidents: His INR remains subtherapeutic today. He has a coumadin clinic appointment tomorrow morning. -Resume home dose coumadin -Follow-up in Coumadin clinic tomorrow  Type 2 diabetes: Sugars mildly elevated since decreasing his home insulin regiment. -Resume home insulin regiment upon discharge  Dental abscess: He's currently on penicillin and will complete the course in 3 days as prescribed. Not complaining of worsening pain and very low suspicion for endocarditis. -Complete penicillin prescription  Dispo: Discharge today  The patient does have a current PCP ([name], NP) and does need an [facility] hospital  follow-up appointment after discharge.  The patient does not know have transportation limitations that hinder transportation to clinic appointments.  .Services Needed at time of discharge: Y = Yes, Blank = No PT:   OT:   RN:   Equipment:   Other:     Loleta Chance, MD 01/24/2015, 10:05 AM

## 2015-01-24 NOTE — Discharge Instructions (Signed)
Please follow up in coumadin clinic Please follow up with Ms Feliciana Rossetti, your NP, and your PCP for hospital follow up Please come to the ER for any chest pain Please finish your penicillin

## 2015-01-24 NOTE — Discharge Summary (Signed)
Name: Thomas Sosa MRN: [id number] DOB: 1955-11-29 59 y.o. PCP: Thomas Bosch, NP  Date of Admission: 01/23/2015  3:34 AM Date of Discharge: 01/24/2015 Attending Physician: Thomas Falcon, MD  Discharge Diagnosis: 1. Rule out ACS Active Problems:   Chest pain  Discharge Medications:   Medication List    TAKE these medications        amitriptyline 50 MG tablet  Commonly known as:  ELAVIL  Take 50 mg by mouth at bedtime.     aspirin 81 MG EC tablet  Take 1 tablet (81 mg total) by mouth daily.     atorvastatin 80 MG tablet  Commonly known as:  LIPITOR  Take 1 tablet (80 mg total) by mouth daily.     carvedilol 6.25 MG tablet  Commonly known as:  COREG  Take 1 tablet (6.25 mg total) by mouth 2 (two) times daily with a meal.     clopidogrel 75 MG tablet  Commonly known as:  PLAVIX  Take 1 tablet (75 mg total) by mouth daily.     gabapentin 600 MG tablet  Commonly known as:  NEURONTIN  take 1 and 1/2 tablets by mouth three times a day     glipiZIDE 5 MG 24 hr tablet  Commonly known as:  GLUCOTROL XL  Take 1 tablet (5 mg total) by mouth daily with breakfast.     HYDROcodone-acetaminophen 5-325 MG tablet  Commonly known as:  NORCO/VICODIN  Take 1-2 tablets by mouth every 6 (six) hours as needed for moderate pain.     insulin aspart 100 UNIT/ML injection  Commonly known as:  novoLOG  Inject 10 Units into the skin 3 (three) times daily before meals. < 400 = 14 units; > 400 = call MD     insulin detemir 100 UNIT/ML injection  Commonly known as:  LEVEMIR  Inject 0.35 mLs (35 Units total) into the skin 2 (two) times daily.     losartan 50 MG tablet  Commonly known as:  COZAAR  Take 1 tablet (50 mg total) by mouth daily.     omeprazole 20 MG capsule  Commonly known as:  PRILOSEC  Take 1 capsule (20 mg total) by mouth daily. Acid reflux     ONE TOUCH ULTRA TEST test strip  Generic drug:  glucose blood  TEST four times a day     penicillin v potassium 500 MG  tablet  Commonly known as:  VEETID  Take 1 tablet (500 mg total) by mouth 4 (four) times daily.     polyethylene glycol packet  Commonly known as:  MIRALAX / GLYCOLAX  Take 17 g by mouth 2 (two) times daily.     traMADol 50 MG tablet  Commonly known as:  ULTRAM  Take 2 tablets (100 mg total) by mouth every 6 (six) hours as needed for moderate pain.     warfarin 5 MG tablet  Commonly known as:  COUMADIN  Take 1 tablet (5 mg total) by mouth daily.        Disposition and follow-up:   Thomas Sosa was discharged from [facility] in Stable condition.  At the hospital follow up visit please address:  1.  Repeat EKG , any further episodes of chest pain? Has the patient gone to outpatient stress test? appt was made HTN: needs BP check and titration as needed T2DM: poor A1c, may need adjustment of his home meds  Discharge summary being forwarded to Thomas Sosa  2.  Labs / imaging needed at time of follow-up: ekg, stress test   3.  Pending labs/ test needing follow-up:   Follow-up Appointments:     Follow-up Information    Schedule an appointment as soon as possible for a visit with Thomas Bosch, NP.   Specialty:  Internal Medicine   Contact information:   [address] [place] [id number] [phone]       Discharge Instructions: Discharge Instructions    Diet - low sodium heart healthy    Complete by:  As directed      Discharge instructions    Complete by:  As directed   Please finish your penicillin Please follow up with the coumadin clinic Please follow up in our clinic for hospital follow up     Increase activity slowly    Complete by:  As directed            Consultations:    Procedures Performed:  Ct Head Wo Contrast  01/16/2015  CLINICAL DATA:  Dizziness and lightheadedness, recent stroke EXAM: CT HEAD WITHOUT CONTRAST TECHNIQUE: Contiguous axial images were obtained from the base of the skull through the vertex without  intravenous contrast. COMPARISON:  10/10/2014 FINDINGS: Bony calvarium is intact. Mucosal thickening is noted throughout the ethmoid sinuses of uncertain chronicity. Scattered areas of decreased attenuation are noted within the right cerebral hemisphere similar to that seen on the prior MRI examination consistent with small lacunar infarcts. No acute infarction is identified. No findings to suggest acute hemorrhage or space-occupying mass lesion are noted. IMPRESSION: Changes of prior lacunar infarction.  No acute abnormality is noted. Electronically Signed   By: Inez Catalina M.D.   On: 01/16/2015 18:12   Dg Chest Portable 1 View  01/23/2015  CLINICAL DATA:  Acute onset of generalized chest pain. Initial encounter. EXAM: PORTABLE CHEST 1 VIEW COMPARISON:  Chest radiograph performed 10/14/2014, and CT of the chest performed 10/04/2014 FINDINGS: The lungs are well-aerated and clear. There is no evidence of focal opacification, pleural effusion or pneumothorax. The cardiomediastinal silhouette is borderline normal in size. An AICD is noted overlying the left chest wall with a single lead ending overlying the right ventricle. The patient is status post median sternotomy. No acute osseous abnormalities are seen. IMPRESSION: No acute cardiopulmonary process seen. Electronically Signed   By: Garald Balding M.D.   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  Pt said that last night he was watching TV at 2:30 AM and then he noticed sharp, substernal, nonradiating 7/10 chest pain that lasted until 5:30 AM. He has  never had chest pain like this before, though he has had several MIs.. At 2:30, he also checked his BP and it was 189/119 and then 220/117 that night. Pt took 4 aspirin per the 911 instructor. Pt has nitroglycerin at home but did not take it as he was not sure. Then he called EMS and he got the nitroglycerin and his chest pain  was relieved.He said that it was a normal day for him and he took all his meds, except the night time carvedilol. He did not notice that his ICD had fired. Also he was not hypoglycemic.  Pt denies any fevers, or systemic symptoms. He does endorse some blurry vision, and headache but no LOC. He has chronic shortness of breath/presumed COPD for which he uses inhalers, and is not on home oxygen. Does have diabetic neuropathy and decreased sensation in feet.   Also, he was recently admitted in a hospital in [place] when he was visiting his son as daughter thought he was having a stroke again but the workup was negative, and he was asked to follow up here.  In the ER, his BP was improved, his EKG was unchanged from prior and CXR was negative. 1st troponin was normal. Currently on admission, he was free of chest pain. His BP was 149/110. He denies any weakness, numbness, LOC, or any neurological symptoms.  FH: of diabetes and cardiac history- brother had MI at [address] [place]: ex-smoker quit in January. Has 40 year history of 1/2 ppd, No alcohol and no illicit drugs  Hospital Course by problem list: Active Problems:   Chest pain   Chest pain in the setting of hypertensive urgency,now resolved:  Due to his extensive cardiac history, he was observed overnight by telemetry. ACS was ruled out by normal troponins x3, and AM EKG had some nonspecific changes, but none concerning. He was set up for outpatient stress test, and will need repeat EKG on discharge. He was instructed to come to ER should he have chest pain, and advised to take the nitroglycerin tablets which he has at home for any chest pain.  He was chest pain-free upon discharge.  HTN: Pt with BP of 200/100 at home, and 149/120 on admission. His BP remained stable and home meds were continued.   History of CVA now on coumadin- with INR of 1.6 subtherapeutic. Coumadin was continued and pt has appt at the coumadin clinic tomorrow.  IDDM with neuropathy: lantus 40 units bid and novolog at home, poorly controlled with A1c of 11.2 on 01/20/2015. CBG remained stable and his home doses were continued and also resumed on discharge.  Dental abscess: He was on penicillin, and the penicillin was continued as it was originally prescribed.  Discharge Vitals:   BP 125/54 mmHg  Pulse 75  Temp(Src) 97.7 F (36.5 C) (Oral)  Resp 20  Ht 5' 11.5" (1.816 m)  Wt 256 lb 6.4 oz (116.302 kg)  BMI 35.27 kg/m2  SpO2 100%  Discharge Labs:  Results for orders placed or performed during the hospital encounter of 01/23/15 (from the past 24 hour(s))  Glucose, capillary     Status: Abnormal   Collection Time: 01/23/15 11:03 AM  Result Value Ref Range   Glucose-Capillary 274 (H) 65 - 99 mg/dL   Comment 1 Notify RN   Troponin I (q 6hr x 3)     Status: Abnormal   Collection Time: 01/23/15 12:43 PM  Result Value Ref Range   Troponin I 0.05 (H) <0.031 ng/mL  Glucose, capillary     Status: Abnormal   Collection Time: 01/23/15  4:15 PM  Result Value Ref Range   Glucose-Capillary 276 (H) 65 - 99 mg/dL   Comment 1 Notify RN   Troponin I (q 6hr x 3)     Status: None   Collection Time: 01/23/15  6:41 PM  Result Value Ref Range   Troponin I <  0.03 <0.031 ng/mL  Glucose, capillary     Status: Abnormal   Collection Time: 01/23/15 11:15 PM  Result Value Ref Range   Glucose-Capillary 227 (H) 65 - 99 mg/dL  Basic metabolic panel     Status: Abnormal   Collection Time: 01/24/15  3:56 AM  Result Value Ref Range   Sodium 137 135 - 145 mmol/L   Potassium 3.4 (L) 3.5 - 5.1 mmol/L   Chloride 103 101 - 111 mmol/L   CO2 27 22 - 32 mmol/L   Glucose, Bld 249  (H) 65 - 99 mg/dL   BUN 13 6 - 20 mg/dL   Creatinine, Ser 0.76 0.61 - 1.24 mg/dL   Calcium 8.5 (L) 8.9 - 10.3 mg/dL   GFR calc non Af Amer >60 >60 mL/min   GFR calc Af Amer >60 >60 mL/min   Anion gap 7 5 - 15  CBC     Status: Abnormal   Collection Time: 01/24/15  3:56 AM  Result Value Ref Range   WBC 6.8 4.0 - 10.5 K/uL   RBC 3.99 (L) 4.22 - 5.81 MIL/uL   Hemoglobin 11.8 (L) 13.0 - 17.0 g/dL   HCT 35.1 (L) 39.0 - 52.0 %   MCV 88.0 78.0 - 100.0 fL   MCH 29.6 26.0 - 34.0 pg   MCHC 33.6 30.0 - 36.0 g/dL   RDW 13.2 11.5 - 15.5 %   Platelets 222 150 - 400 K/uL  Protime-INR     Status: Abnormal   Collection Time: 01/24/15  3:56 AM  Result Value Ref Range   Prothrombin Time 20.1 (H) 11.6 - 15.2 seconds   INR 1.71 (H) 0.00 - 1.49  Glucose, capillary     Status: Abnormal   Collection Time: 01/24/15  6:20 AM  Result Value Ref Range   Glucose-Capillary 195 (H) 65 - 99 mg/dL   Comment 1 Notify RN     Signed: Burgess Estelle, MD 01/24/2015, 10:58 AM    Services Ordered on Discharge:  Equipment Ordered on Discharge:

## 2015-01-25 ENCOUNTER — Ambulatory Visit: Payer: Medicare HMO | Attending: Internal Medicine | Admitting: Pharmacist

## 2015-01-25 ENCOUNTER — Ambulatory Visit: Payer: Medicare HMO | Admitting: Endocrinology

## 2015-01-25 DIAGNOSIS — Z0289 Encounter for other administrative examinations: Secondary | ICD-10-CM

## 2015-01-25 LAB — POCT INR: INR: 1.6

## 2015-01-28 ENCOUNTER — Encounter: Payer: Medicare HMO | Admitting: Internal Medicine

## 2015-02-04 ENCOUNTER — Inpatient Hospital Stay: Payer: Medicare HMO | Admitting: Internal Medicine

## 2015-02-05 ENCOUNTER — Other Ambulatory Visit: Payer: Self-pay | Admitting: Internal Medicine

## 2015-02-06 ENCOUNTER — Telehealth: Payer: Self-pay | Admitting: Internal Medicine

## 2015-02-08 ENCOUNTER — Other Ambulatory Visit: Payer: Self-pay

## 2015-02-08 DIAGNOSIS — E1142 Type 2 diabetes mellitus with diabetic polyneuropathy: Secondary | ICD-10-CM

## 2015-02-10 MED ORDER — INSULIN DETEMIR 100 UNIT/ML ~~LOC~~ SOLN: 35.0000 [IU] | Freq: Two times a day (BID) | SUBCUTANEOUS | Status: DC

## 2015-02-10 MED ORDER — ASPIRIN 81 MG PO TBEC: 81.0000 mg | DELAYED_RELEASE_TABLET | Freq: Every day | ORAL | Status: DC

## 2015-02-14 ENCOUNTER — Other Ambulatory Visit: Payer: Self-pay

## 2015-02-14 DIAGNOSIS — E1142 Type 2 diabetes mellitus with diabetic polyneuropathy: Secondary | ICD-10-CM

## 2015-02-14 MED ORDER — INSULIN DETEMIR 100 UNIT/ML ~~LOC~~ SOLN: 35.0000 [IU] | Freq: Two times a day (BID) | SUBCUTANEOUS | Status: DC

## 2015-02-14 NOTE — Telephone Encounter (Signed)
Patient was prescribed insulin detemir (LEVEMIR) 100 UNIT/ML injection but is needing Levemir Flextouch Pen. Pt has been using this as it was prescribed by another dr and is hoping you could fill it. Thank you.

## 2015-02-15 ENCOUNTER — Telehealth: Payer: Self-pay | Admitting: Internal Medicine

## 2015-02-15 ENCOUNTER — Telehealth: Payer: Self-pay

## 2015-02-15 NOTE — Telephone Encounter (Signed)
Returned patient phone call Message stated person not accepting incoming calls at this time

## 2015-02-15 NOTE — Telephone Encounter (Signed)
Patient stated that they lost their medicine and is requesting a refill Atorvastatin Glipizide Omeprazole Losartan Warfin Asprin 81MG   Please follow up with patient

## 2015-02-21 ENCOUNTER — Ambulatory Visit: Payer: Medicare HMO | Attending: Internal Medicine | Admitting: Internal Medicine

## 2015-02-21 ENCOUNTER — Encounter: Payer: Self-pay | Admitting: Internal Medicine

## 2015-02-21 ENCOUNTER — Ambulatory Visit: Payer: Self-pay | Admitting: Internal Medicine

## 2015-02-21 VITALS — BP 122/82 | HR 71 | Temp 98.0°F | Resp 16 | Ht 71.0 in | Wt 251.8 lb

## 2015-02-21 DIAGNOSIS — Z888 Allergy status to other drugs, medicaments and biological substances status: Secondary | ICD-10-CM | POA: Diagnosis not present

## 2015-02-21 DIAGNOSIS — Z79899 Other long term (current) drug therapy: Secondary | ICD-10-CM | POA: Diagnosis not present

## 2015-02-21 DIAGNOSIS — I63429 Cerebral infarction due to embolism of unspecified anterior cerebral artery: Secondary | ICD-10-CM | POA: Insufficient documentation

## 2015-02-21 DIAGNOSIS — I2510 Atherosclerotic heart disease of native coronary artery without angina pectoris: Secondary | ICD-10-CM | POA: Diagnosis not present

## 2015-02-21 DIAGNOSIS — Z8546 Personal history of malignant neoplasm of prostate: Secondary | ICD-10-CM | POA: Insufficient documentation

## 2015-02-21 DIAGNOSIS — Z7982 Long term (current) use of aspirin: Secondary | ICD-10-CM | POA: Insufficient documentation

## 2015-02-21 DIAGNOSIS — Z794 Long term (current) use of insulin: Secondary | ICD-10-CM | POA: Diagnosis not present

## 2015-02-21 DIAGNOSIS — I6619 Occlusion and stenosis of unspecified anterior cerebral artery: Secondary | ICD-10-CM | POA: Insufficient documentation

## 2015-02-21 DIAGNOSIS — Z9114 Patient's other noncompliance with medication regimen: Secondary | ICD-10-CM | POA: Insufficient documentation

## 2015-02-21 DIAGNOSIS — Z7901 Long term (current) use of anticoagulants: Secondary | ICD-10-CM | POA: Insufficient documentation

## 2015-02-21 DIAGNOSIS — E785 Hyperlipidemia, unspecified: Secondary | ICD-10-CM | POA: Insufficient documentation

## 2015-02-21 DIAGNOSIS — Z87891 Personal history of nicotine dependence: Secondary | ICD-10-CM | POA: Diagnosis not present

## 2015-02-21 DIAGNOSIS — E1140 Type 2 diabetes mellitus with diabetic neuropathy, unspecified: Secondary | ICD-10-CM | POA: Insufficient documentation

## 2015-02-21 DIAGNOSIS — I739 Peripheral vascular disease, unspecified: Secondary | ICD-10-CM | POA: Diagnosis not present

## 2015-02-21 DIAGNOSIS — Z8673 Personal history of transient ischemic attack (TIA), and cerebral infarction without residual deficits: Secondary | ICD-10-CM | POA: Diagnosis not present

## 2015-02-21 DIAGNOSIS — E119 Type 2 diabetes mellitus without complications: Secondary | ICD-10-CM | POA: Diagnosis not present

## 2015-02-21 LAB — POCT URINALYSIS DIPSTICK
BILIRUBIN UA: NEGATIVE
GLUCOSE UA: 500
KETONES UA: NEGATIVE
LEUKOCYTES UA: NEGATIVE
Nitrite, UA: NEGATIVE
Protein, UA: 100
SPEC GRAV UA: 1.01
Urobilinogen, UA: >=8
pH, UA: 6

## 2015-02-21 LAB — POCT INR: INR: 1.4

## 2015-02-21 LAB — GLUCOSE, POCT (MANUAL RESULT ENTRY): POC Glucose: 345 mg/dl — AB (ref 70–99)

## 2015-02-21 MED ORDER — INSULIN ASPART 100 UNIT/ML ~~LOC~~ SOLN
10.0000 [IU] | Freq: Once | SUBCUTANEOUS | Status: AC
  Administered 2015-02-21: 10 [IU] via SUBCUTANEOUS

## 2015-02-21 MED ORDER — WARFARIN SODIUM 5 MG PO TABS: ORAL_TABLET | ORAL | Status: DC

## 2015-02-21 NOTE — Progress Notes (Signed)
Patient here for follow up from his stroke, HTN diabetes Patient presents with elevated blood sugar-10 units novolog given per office protocol

## 2015-02-21 NOTE — Progress Notes (Signed)
Patient ID: Thomas Sosa, male   DOB: 07-19-1955, 59 y.o.   MRN: [id number]  CC: follow up  HPI: Thomas Sosa is a 59 y.o. male here today for a follow up visit.  Patient has past medical history of diabetes, HTN, CAD, embolic stroke, tobacco use. Patient reports that he recently returned from [place] with family and was unable to make his coumadin clinic follow ups. His last INR was completed on 01/25/15 and it was sub-therapeutic at that time. Patient reports although he missed his appointment he has continued to take his coumadin.  Patient has been seen by [facility] but missed his last appointment. He is scheduled to have a sleep study next week. He has been unable to be reached by Endocrinology because his phone was disconnected. Patient has not been checking his sugars but reports medication compliance.   Patient has No headache, No chest pain, No abdominal pain - No Nausea, No new weakness tingling or numbness, No Cough - SOB.  Allergies  Allergen Reactions  . Lisinopril Cough   Past Medical History  Diagnosis Date  . Hypertension   . Diabetes mellitus without complication ([place])   . Diabetic neuropathy ([place]) Dx 2011  . Cancer of prostate ([place]) Dx 2005  . Hyperlipidemia   . Coronary artery disease   . Tobacco abuse   . Peripheral arterial disease ([place])   . Stroke [facility])    Current Outpatient Prescriptions on File Prior to Visit  Medication Sig Dispense Refill  . amitriptyline (ELAVIL) 50 MG tablet Take 50 mg by mouth at bedtime.    [name] aspirin 81 MG EC tablet Take 1 tablet (81 mg total) by mouth daily. 30 tablet 0  . atorvastatin (LIPITOR) 80 MG tablet Take 1 tablet (80 mg total) by mouth daily. 30 tablet 4  . carvedilol (COREG) 6.25 MG tablet Take 1 tablet (6.25 mg total) by mouth 2 (two) times daily with a meal. 60 tablet 4  . gabapentin (NEURONTIN) 600 MG tablet take 1 and 1/2 tablets by mouth three times a day 135 tablet 1  . glipiZIDE (GLUCOTROL XL) 5 MG 24 hr  tablet Take 1 tablet (5 mg total) by mouth daily with breakfast. 30 tablet 2  . insulin aspart (NOVOLOG) 100 UNIT/ML injection Inject 10 Units into the skin 3 (three) times daily before meals. < 400 = 14 units; > 400 = call MD 10 mL 11  . insulin detemir (LEVEMIR) 100 UNIT/ML injection Inject 0.35 mLs (35 Units total) into the skin 2 (two) times daily. 10 mL 11  . losartan (COZAAR) 50 MG tablet Take 1 tablet (50 mg total) by mouth daily. 30 tablet 4  . omeprazole (PRILOSEC) 20 MG capsule Take 1 capsule (20 mg total) by mouth daily. Acid reflux 30 capsule 4  . polyethylene glycol (MIRALAX / GLYCOLAX) packet Take 17 g by mouth 2 (two) times daily. (Patient taking differently: Take 17 g by mouth daily as needed for mild constipation. ) 14 each 0  . traMADol (ULTRAM) 50 MG tablet Take 2 tablets (100 mg total) by mouth every 6 (six) hours as needed for moderate pain. 30 tablet 0  . warfarin (COUMADIN) 5 MG tablet Take 1 tablet (5 mg total) by mouth daily. (Patient taking differently: Take 5-7.5 mg by mouth daily. Take 5mg  everyday except Monday, Wednesday and Thursday take 7.5mg ) 30 tablet 3  . clopidogrel (PLAVIX) 75 MG tablet Take 1 tablet (75 mg total) by mouth daily. (Patient not taking: Reported on  11/10/2014) 30 tablet 2  . HYDROcodone-acetaminophen (NORCO/VICODIN) 5-325 MG tablet Take 1-2 tablets by mouth every 6 (six) hours as needed for moderate pain. 14 tablet 0  . ONE TOUCH ULTRA TEST test strip TEST four times a day 100 each 6  . penicillin v potassium (VEETID) 500 MG tablet Take 1 tablet (500 mg total) by mouth 4 (four) times daily. 40 tablet 0   No current facility-administered medications on file prior to visit.   Family History  Problem Relation Age of Onset  . Heart Problems Mother   . Heart Problems Father    Social History   Social History  . Marital Status: Married    Spouse Name: N/A  . Number of Children: N/A  . Years of Education: N/A   Occupational History  . Not on  file.   Social History Main Topics  . Smoking status: Former Smoker    Quit date: 03/26/2014  . Smokeless tobacco: Not on file  . Alcohol Use: No  . Drug Use: Not on file  . Sexual Activity: Not on file   Other Topics Concern  . Not on file   Social History Narrative    Review of Systems: Constitutional: Negative for fever, chills, diaphoresis, activity change, appetite change and fatigue. HENT: Negative for ear pain, nosebleeds, congestion, facial swelling, rhinorrhea, neck pain, neck stiffness and ear discharge.  Eyes: Negative for pain, discharge, redness, itching and visual disturbance. Respiratory: Negative for cough, choking, chest tightness, shortness of breath, wheezing and stridor.  Cardiovascular: Negative for chest pain, palpitations and leg swelling. Gastrointestinal: Negative for abdominal distention. Genitourinary: Negative for dysuria, urgency, frequency, hematuria, flank pain, decreased urine volume, difficulty urinating and dyspareunia.  Musculoskeletal: Negative for back pain, joint swelling, arthralgias and gait problem. Neurological: Negative for dizziness, tremors, seizures, syncope, facial asymmetry, speech difficulty, weakness, light-headedness, numbness and headaches.  Hematological: Negative for adenopathy. Does not bruise/bleed easily. Psychiatric/Behavioral: Negative for hallucinations, behavioral problems, confusion, dysphoric mood, decreased concentration and agitation.    Objective:   Filed Vitals:   02/21/15 1723  BP: 122/82  Pulse: 71  Temp: 98 F (36.7 C)  Resp: 16    Physical Exam  Constitutional: He is oriented to person, place, and time.  Cardiovascular: Normal rate, regular rhythm and normal heart sounds.   Pulmonary/Chest: Effort normal and breath sounds normal.  Neurological: He is alert and oriented to person, place, and time.  Skin: Skin is warm and dry.     Lab Results  Component Value Date   WBC 6.8 01/24/2015   HGB 11.8*  01/24/2015   HCT 35.1* 01/24/2015   MCV 88.0 01/24/2015   PLT 222 01/24/2015   Lab Results  Component Value Date   CREATININE 0.76 01/24/2015   BUN 13 01/24/2015   NA 137 01/24/2015   K 3.4* 01/24/2015   CL 103 01/24/2015   CO2 27 01/24/2015    Lab Results  Component Value Date   HGBA1C 11.20 01/20/2015   Lipid Panel     Component Value Date/Time   CHOL 179 10/05/2014 0540   TRIG 253* 10/05/2014 0540   HDL 28* 10/05/2014 0540   CHOLHDL 6.4 10/05/2014 0540   VLDL 51* 10/05/2014 0540   LDLCALC 100* 10/05/2014 0540       Assessment and plan:   Thomas Sosa was seen today for follow-up.  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 -     Ambulatory referral to Neurology I have adjusted patients coumadin today. He will need to return next week with PharmD for adjustments as necessary. I have really stressed to patient that seriousness of his condition. He has has multiple infarcts due to his medication non compliance. I have explained that if he continues to have sub therapeutic levels he is placing himself at risk for repeat events and even death. Patient still appears to be nonchalant.   Type 2 diabetes mellitus without complication, without long-term current use of insulin ([facility]) -     Glucose (CBG) -     Microalbumin, urine -     Urinalysis Dipstick -     insulin aspart (novoLOG) injection 10 Units; Inject 0.1 mLs (10 Units total) into the skin once. I believe patient is non-complaint. He is not checking sugars and I do not believe he is taking insulin as directed. Patient is high risk for long term diabetic complications. I have stressed to him to call back in 1 week if he has not heard from Endocrinology.   Return in about 1 week  (around 02/28/2015) for [facility]       [name], [facility] [phone] 02/21/2015, 5:42 PM

## 2015-02-21 NOTE — Patient Instructions (Addendum)
Take 1.5 tablets (7.5mg ) everyday except Tuesday and Thursday take 1 tablet (5 mg).  You should receive a call from Endocrinology--diabetes specialist and Neurology--stroke specialist. If you do not heard from these specialist in 2 weeks please call us to make sure they went through

## 2015-02-22 LAB — MICROALBUMIN, URINE: Microalb, Ur: 37 mg/dL

## 2015-02-23 ENCOUNTER — Other Ambulatory Visit: Payer: Self-pay

## 2015-02-23 ENCOUNTER — Ambulatory Visit (HOSPITAL_BASED_OUTPATIENT_CLINIC_OR_DEPARTMENT_OTHER): Payer: Medicare HMO | Attending: Anesthesiology | Admitting: *Deleted

## 2015-02-23 VITALS — Ht 71.0 in | Wt 235.0 lb

## 2015-02-23 DIAGNOSIS — E1142 Type 2 diabetes mellitus with diabetic polyneuropathy: Secondary | ICD-10-CM

## 2015-02-23 DIAGNOSIS — G4700 Insomnia, unspecified: Secondary | ICD-10-CM | POA: Insufficient documentation

## 2015-02-23 DIAGNOSIS — R0683 Snoring: Secondary | ICD-10-CM | POA: Diagnosis not present

## 2015-02-23 DIAGNOSIS — G4719 Other hypersomnia: Secondary | ICD-10-CM | POA: Diagnosis not present

## 2015-02-23 DIAGNOSIS — G4733 Obstructive sleep apnea (adult) (pediatric): Secondary | ICD-10-CM | POA: Diagnosis present

## 2015-02-23 DIAGNOSIS — G4737 Central sleep apnea in conditions classified elsewhere: Secondary | ICD-10-CM | POA: Insufficient documentation

## 2015-02-23 DIAGNOSIS — R5383 Other fatigue: Secondary | ICD-10-CM | POA: Insufficient documentation

## 2015-02-23 DIAGNOSIS — I493 Ventricular premature depolarization: Secondary | ICD-10-CM | POA: Insufficient documentation

## 2015-02-24 MED ORDER — GLIPIZIDE ER 5 MG PO TB24: 5.0000 mg | ORAL_TABLET | Freq: Every day | ORAL | Status: DC

## 2015-02-28 ENCOUNTER — Ambulatory Visit (INDEPENDENT_AMBULATORY_CARE_PROVIDER_SITE_OTHER): Payer: Medicare HMO | Admitting: Neurology

## 2015-02-28 ENCOUNTER — Encounter: Payer: Self-pay | Admitting: Neurology

## 2015-02-28 VITALS — BP 124/76 | HR 70 | Ht 71.0 in | Wt 245.5 lb

## 2015-02-28 DIAGNOSIS — I69359 Hemiplegia and hemiparesis following cerebral infarction affecting unspecified side: Secondary | ICD-10-CM | POA: Diagnosis not present

## 2015-02-28 DIAGNOSIS — I69398 Other sequelae of cerebral infarction: Secondary | ICD-10-CM | POA: Diagnosis not present

## 2015-02-28 DIAGNOSIS — E1142 Type 2 diabetes mellitus with diabetic polyneuropathy: Secondary | ICD-10-CM

## 2015-02-28 HISTORY — DX: Hemiplegia and hemiparesis following cerebral infarction affecting unspecified side: I69.359

## 2015-02-28 NOTE — Patient Instructions (Signed)
Stroke Prevention Some medical conditions and behaviors are associated with an increased chance of having a stroke. You may prevent a stroke by making healthy choices and managing medical conditions. HOW CAN I REDUCE MY RISK OF HAVING A STROKE?   Stay physically active. Get at least 30 minutes of activity on most or all days.  Do not smoke. It may also be helpful to avoid exposure to secondhand smoke.  Limit alcohol use. Moderate alcohol use is considered to be:  No more than 2 drinks per day for men.  No more than 1 drink per day for nonpregnant women.  Eat healthy foods. This involves:  Eating 5 or more servings of fruits and vegetables a day.  Making dietary changes that address high blood pressure (hypertension), high cholesterol, diabetes, or obesity.  Manage your cholesterol levels.  Making food choices that are high in fiber and low in saturated fat, trans fat, and cholesterol may control cholesterol levels.  Take any prescribed medicines to control cholesterol as directed by your health care provider.  Manage your diabetes.  Controlling your carbohydrate and sugar intake is recommended to manage diabetes.  Take any prescribed medicines to control diabetes as directed by your health care provider.  Control your hypertension.  Making food choices that are low in salt (sodium), saturated fat, trans fat, and cholesterol is recommended to manage hypertension.  Ask your health care provider if you need treatment to lower your blood pressure. Take any prescribed medicines to control hypertension as directed by your health care provider.  If you are 18-39 years of age, have your blood pressure checked every 3-5 years. If you are 40 years of age or older, have your blood pressure checked every year.  Maintain a healthy weight.  Reducing calorie intake and making food choices that are low in sodium, saturated fat, trans fat, and cholesterol are recommended to manage  weight.  Stop drug abuse.  Avoid taking birth control pills.  Talk to your health care provider about the risks of taking birth control pills if you are over 35 years old, smoke, get migraines, or have ever had a blood clot.  Get evaluated for sleep disorders (sleep apnea).  Talk to your health care provider about getting a sleep evaluation if you snore a lot or have excessive sleepiness.  Take medicines only as directed by your health care provider.  For some people, aspirin or blood thinners (anticoagulants) are helpful in reducing the risk of forming abnormal blood clots that can lead to stroke. If you have the irregular heart rhythm of atrial fibrillation, you should be on a blood thinner unless there is a good reason you cannot take them.  Understand all your medicine instructions.  Make sure that other conditions (such as anemia or atherosclerosis) are addressed. SEEK IMMEDIATE MEDICAL CARE IF:   You have sudden weakness or numbness of the face, arm, or leg, especially on one side of the body.  Your face or eyelid droops to one side.  You have sudden confusion.  You have trouble speaking (aphasia) or understanding.  You have sudden trouble seeing in one or both eyes.  You have sudden trouble walking.  You have dizziness.  You have a loss of balance or coordination.  You have a sudden, severe headache with no known cause.  You have new chest pain or an irregular heartbeat. Any of these symptoms may represent a serious problem that is an emergency. Do not wait to see if the symptoms will   go away. Get medical help at once. Call your local emergency services (911 in [place] Do not drive yourself to the hospital.   This information is not intended to replace advice given to you by your health care provider. Make sure you discuss any questions you have with your health care provider.   Document Released: 04/19/2004 Document Revised: 04/02/2014 Document Reviewed:  09/12/2012 Elsevier Interactive Patient Education 2016 Elsevier Inc.  

## 2015-02-28 NOTE — Progress Notes (Signed)
Reason for visit: Stroke  Referring physician: Dr. Nolon Sosa is a 59 y.o. male  History of present illness:  Thomas Sosa is a 59 year old right-handed white male with a history of hypertension, lipidemia, and diabetes. The patient was admitted to the hospital in July 2016 with left-sided weakness, he was found to have an embolic stroke event to the right brain. He has been demonstrated to have a low ejection fraction of 25-30% on 2-D echocardiogram. The patient has had poorly controlled diabetes, he still indicates that his blood sugars are running in the 250 range, and he has had poorly controlled hypertension. He was recently seen in the emergency room around 01/24/2015 with chest pain and extremely high blood pressures. His blood pressures are in general high before he takes his medications in the morning, his blood pressures do better later in the day. The patient has had some improvement in his left-sided weakness and numbness. He has some residual numbness on the left flank area, his left arm strength has improved. The patient reports a diabetic peripheral neuropathy with numbness below the knees bilaterally, he denies any falls. He denies any issues controlling the bowels or the bladder, and he is swallowing well. He has been placed on Coumadin therapy. He returns to this office for an evaluation. Prior carotid Doppler studies done were unremarkable.  Past Medical History  Diagnosis Date  . Hypertension   . Diabetes mellitus without complication ([place])   . Diabetic neuropathy ([place]) Dx 2011  . Cancer of prostate ([place]) Dx 2005  . Hyperlipidemia   . Coronary artery disease   . Tobacco abuse   . Peripheral arterial disease ([place])   . Stroke (Thomas Sosa)   . Hemiparesis and alteration of sensations as late effects of stroke ([place]) 02/28/2015    Past Surgical History  Procedure Laterality Date  . Replacement total knee  Lt 2013/ Rt 2005  . Ep implantable device N/A 10/13/2014   Procedure: ICD Implant;  Surgeon: Deboraha Sprang, MD;  Location: [facility];  Service: Cardiovascular;  Laterality: N/A;  . Tee without cardioversion N/A 10/11/2014    Procedure: TRANSESOPHAGEAL ECHOCARDIOGRAM (TEE);  Surgeon: Dorothy Spark, MD;  Location: [facility] ENDOSCOPY;  Service: Cardiovascular;  Laterality: N/A;    Family History  Problem Relation Age of Onset  . Heart Problems Mother   . Heart Problems Father   . Heart attack Brother     Social history:  reports that he quit smoking about 11 months ago. He has never used smokeless tobacco. He reports that he does not drink alcohol or use illicit drugs.  Medications:  Prior to Admission medications   Medication Sig Start Date End Date Taking? Authorizing Provider  amitriptyline (ELAVIL) 50 MG tablet Take 50 mg by mouth at bedtime.   Yes Historical Provider, MD  aspirin 81 MG EC tablet Take 1 tablet (81 mg total) by mouth daily. 02/10/15  Yes [name], NP  atorvastatin (LIPITOR) 80 MG tablet Take 1 tablet (80 mg total) by mouth daily. 01/20/15  Yes [name], NP  carvedilol (COREG) 6.25 MG tablet Take 1 tablet (6.25 mg total) by mouth 2 (two) times daily with a meal. 01/20/15  Yes [name], NP  gabapentin (NEURONTIN) 600 MG tablet take 1 and 1/2 tablets by mouth three times a day 01/04/15  Yes Olugbemiga E Doreene Burke, MD  glipiZIDE (GLUCOTROL XL) 5 MG 24 hr tablet Take 1 tablet (5 mg total) by mouth  daily with breakfast. 02/24/15  Yes [name], NP  HYDROcodone-acetaminophen (NORCO/VICODIN) 5-325 MG tablet Take 1-2 tablets by mouth every 6 (six) hours as needed for moderate pain. 01/16/15  Yes Fredia Sorrow, MD  insulin aspart (NOVOLOG) 100 UNIT/ML injection Inject 10 Units into the skin 3 (three) times daily before meals. < 400 = 14 units; > 400 = call MD 10/14/14  Yes Belkys A Regalado, MD  insulin detemir (LEVEMIR) 100 UNIT/ML injection Inject 0.35 mLs (35 Units total) into the skin 2 (two) times daily.  02/14/15  Yes [name], NP  losartan (COZAAR) 50 MG tablet Take 1 tablet (50 mg total) by mouth daily. 01/20/15  Yes [name], NP  omeprazole (PRILOSEC) 20 MG capsule Take 1 capsule (20 mg total) by mouth daily. Acid reflux 01/20/15  Yes [name], NP  ONE TOUCH ULTRA TEST test strip TEST four times a day 06/07/14  Yes [name], NP  oxyCODONE-acetaminophen (PERCOCET/ROXICET) 5-325 MG tablet Take 1 tablet by mouth every 8 (eight) hours as needed. 01/06/15  Yes Historical Provider, MD  penicillin v potassium (VEETID) 500 MG tablet Take 1 tablet (500 mg total) by mouth 4 (four) times daily. 01/16/15  Yes Fredia Sorrow, MD  polyethylene glycol (MIRALAX / GLYCOLAX) packet Take 17 g by mouth 2 (two) times daily. Patient taking differently: Take 17 g by mouth daily as needed for mild constipation.  10/14/14  Yes Belkys A Regalado, MD  traMADol (ULTRAM) 50 MG tablet Take 2 tablets (100 mg total) by mouth every 6 (six) hours as needed for moderate pain. 10/14/14  Yes Belkys A Regalado, MD  warfarin (COUMADIN) 5 MG tablet Take 1.5 tablets (7.5mg ) everyday except Tuesday and Thursday take 1 tablet (5 mg). 02/21/15  Yes [name], NP      Allergies  Allergen Reactions  . Lisinopril Cough    ROS:  Out of a complete 14 system review of symptoms, the patient complains only of the following symptoms, and all other reviewed systems are negative.  Fevers, chills, weight loss Swelling in the legs Dizziness Itching Shortness of breath, snoring Diarrhea Urination problems, impotence Feeling cold, increased thirst Memory loss, weakness Insomnia, snoring  Blood pressure 124/76, pulse 70, height 5\' 11"  (1.803 m), weight 245 lb 8 oz (111.358 kg).  Physical Exam  General: The patient is alert and cooperative at the time of the examination.  Eyes: Pupils are equal, round, and reactive to light. Discs are flat bilaterally.  Neck: The neck is supple, no carotid bruits are  noted.  Respiratory: The respiratory examination is clear.  Cardiovascular: The cardiovascular examination reveals a regular rate and rhythm, no obvious murmurs or rubs are noted.  Skin: Extremities are without significant edema.  Neurologic Exam  Mental status: The patient is alert and oriented x 3 at the time of the examination. The patient has apparent normal recent and remote memory, with an apparently normal attention span and concentration ability.  Cranial nerves: Facial symmetry is present. There is good sensation of the face to pinprick and soft touch bilaterally. The strength of the facial muscles and the muscles to head turning and shoulder shrug are normal bilaterally. Speech is well enunciated, no aphasia or dysarthria is noted. Extraocular movements are full. Visual fields are full. The tongue is midline, and the patient has symmetric elevation of the soft palate. No obvious hearing deficits are noted. Affect is slightly flat.  Motor: The motor testing reveals 5 over 5 strength  of all 4 extremities. Good symmetric motor tone is noted throughout.  Sensory: Sensory testing is intact to pinprick, soft touch, vibration sensation, and position sense on the upper extremities. The patient has a pinprick stocking sensory deficit up to the knees bilaterally, he claims absence of vibration sensation and position sensation in the feet, vibration sensation is present in the knees. No evidence of extinction is noted.  Coordination: Cerebellar testing reveals good finger-nose-finger and heel-to-shin bilaterally.  Gait and station: Gait is normal. Tandem gait is slightly unsteady. Romberg is negative. No drift is seen.  Reflexes: Deep tendon reflexes are symmetric, but are depressed bilaterally. Toes are downgoing bilaterally.   MRI brain 10/10/14:  IMPRESSION: Since 10/04/2014, increased number of acute infarcts in the right cerebral hemisphere, MCA and border zone arterial  distribution. Petechial hemorrhage is minimal and stable from 10/04/2014.  * MRI scan images were reviewed online. I agree with the written report.     Hospital workup evaluation, July 2016:  Ct Angio Chest Aorta W/cm &/or Wo/cm 10/04/2014 IMPRESSION: 1. No evidence of acute aortic dissection, aneurysm or other acute vascular abnormality. 2. Cardiomegaly with left ventricular dilatation. 3. Advanced calcification of the coronary arteries. Post surgical changes of prior multivessel CABG. Although evaluation is limited by non cardiac gated technique, the proximal aspect of the bypass grafts opacify with contrast material suggesting patency. Electronically Signed By: Jacqulynn Cadet M.D. On: 10/04/2014 15:04   Mr Brain Wo Contrast 10/10/2014 Since 10/04/2014, increased number of acute infarcts in the right cerebral hemisphere, MCA and border zone arterial distribution. Petechial hemorrhage is minimal and stable from 10/04/2014.   Ct Angio Head & Neck W/cm &/or Wo Cm 10/11/2014 Atherosclerotic disease at both carotid bifurcations but no stenosis greater than 20% on either side. Severe intracranial atherosclerotic disease affecting the carotid siphon regions. Serial 50-70% stenoses in the carotid siphon regions could be hemodynamically significant. No focal proximal anterior or middle cerebral artery finding. 50% stenosis of the right vertebral artery origin. No stenosis of the dominant left vertebral artery. Advanced atherosclerotic disease of both vertebral arteries in the intracranial portion with serial stenoses of 50-70%. 50% stenosis of the proximal basilar artery.   Carotid Ultrasound - Bilateral: 1-39% ICA stenosis. Vertebral artery flow is antegrade.  TTE - Left ventricle: The cavity size was moderately dilated. Systolicfunction was severely reduced. The estimated ejection fractionwas in the range of 25% to 30%. Diffuse hypokinesis. - Left atrium: The atrium was mildly  dilated. - Right atrium: The atrium was mildly dilated. Impressions: No cardiac source of emboli was indentified.  TEE - Left ventricle: The cavity size was moderately dilated. Systolicfunction was severely reduced. The estimated ejection fractionwas in the range of 25% to 30%. Diffuse hypokinesis. - No cardiac source of emboli was indentified. Negative bubble study. - Aorta: Mild atherosclerotic plaque.    Assessment/Plan:  1. Right brain stroke, likely embolic  2. Diabetes  3. Diabetic peripheral neuropathy  4. Hypertension  5. Dyslipidemia  The patient has had poorly controlled hypertension and diabetes. We discussed being aggressive in treating the stroke risk factors. The patient is being treated for the dyslipidemia as well. He is trying to eat a healthy diet, and lose weight. The patient has been placed on Coumadin therapy, he has evidence of a low ejection fraction on the prior workup, stroke likely was embolic in nature. Cardiology wished to have him anticoagulated. He will follow-up in about 6 months, sooner if needed. Hemoglobin A1c was 11.2 one month ago.  C.  Floyde Parkins MD 02/28/2015 9:06 PM  [facility] [address] [place], [place] [address]  Phone [phone] Fax [phone]

## 2015-03-01 ENCOUNTER — Ambulatory Visit: Payer: Medicare HMO | Attending: Family Medicine | Admitting: Pharmacist

## 2015-03-01 LAB — POCT INR: INR: 1.2

## 2015-03-02 ENCOUNTER — Telehealth: Payer: Self-pay | Admitting: Internal Medicine

## 2015-03-02 ENCOUNTER — Telehealth: Payer: Self-pay | Admitting: Cardiology

## 2015-03-02 ENCOUNTER — Telehealth: Payer: Self-pay

## 2015-03-02 ENCOUNTER — Encounter: Payer: Self-pay | Admitting: Internal Medicine

## 2015-03-02 ENCOUNTER — Ambulatory Visit (INDEPENDENT_AMBULATORY_CARE_PROVIDER_SITE_OTHER): Payer: Medicare HMO | Admitting: *Deleted

## 2015-03-02 DIAGNOSIS — I255 Ischemic cardiomyopathy: Secondary | ICD-10-CM | POA: Diagnosis not present

## 2015-03-02 DIAGNOSIS — I5022 Chronic systolic (congestive) heart failure: Secondary | ICD-10-CM | POA: Diagnosis not present

## 2015-03-02 LAB — CUP PACEART INCLINIC DEVICE CHECK
Battery Remaining Longevity: 136 mo
Brady Statistic RV Percent Paced: 0 %
HighPow Impedance: 65 Ohm
Implantable Lead Location: 753860
Lead Channel Impedance Value: 361 Ohm
Lead Channel Impedance Value: 475 Ohm
Lead Channel Setting Pacing Pulse Width: 0.4 ms
Lead Channel Setting Sensing Sensitivity: 0.3 mV
MDC IDC LEAD IMPLANT DT: 20160720
MDC IDC MSMT BATTERY VOLTAGE: 3.11 V
MDC IDC MSMT LEADCHNL RV PACING THRESHOLD AMPLITUDE: 1 V
MDC IDC MSMT LEADCHNL RV PACING THRESHOLD PULSEWIDTH: 0.4 ms
MDC IDC MSMT LEADCHNL RV SENSING INTR AMPL: 18 mV
MDC IDC MSMT LEADCHNL RV SENSING INTR AMPL: 18.625 mV
MDC IDC SESS DTM: 20161207155656
MDC IDC SET LEADCHNL RV PACING AMPLITUDE: 2.5 V

## 2015-03-02 NOTE — Telephone Encounter (Signed)
Patient returned call to nurse. Patient restated, cardiologist told patient a few episodes were recorded on defibrillator, patient should call in to have defibrillator checked. Patient reports not having electricity and can not call in to have defibrillator checked.  Nurse spoke to Roselyn Reef, Education officer, museum at [facility], nurse will forward message to [place] to see if there are any resources for patient.

## 2015-03-02 NOTE — Telephone Encounter (Signed)
Nurse called patient, reached voicemail on home telephone number listed. Left message for patient to call [place] Fant with [facility], at [phone]. Nurse called mobile number, reached recording explaining voicemail box is full.  Patient called and left message for nurse explaining he had been to cardiologist today and they told him he had a few episodes related to his defibrillator. Patient reports having no power and can't call in to cardiologist office to have defibrillator checked.  Nurse was returning patient's call.

## 2015-03-02 NOTE — Telephone Encounter (Signed)
error 

## 2015-03-02 NOTE — Telephone Encounter (Signed)
Pt. Called requesting to speak to nurse about his defibrillator. Pt. Stated nurse had told him to call her back when he had spoken to his cardiologist. Please f/u with pt.

## 2015-03-02 NOTE — Telephone Encounter (Signed)
Pt coming in today at [facility] aware.

## 2015-03-02 NOTE — Telephone Encounter (Signed)
Nurse called patient, patient verified date of birth. Patient reports not getting an answer when he called cardiologist. Patient left message and is waiting on call back.  After researching chart nurse found note from today stating "Pt called in a stated that his ICD vibrated. Requested that pt send a remote transmission from home monitor. Pt said he could not do that b/c his power had just been disconnected. Offered a pt for Wednesday 12/7 or Thursday 12/8 at 2:00 for either day. Pt said he would call back and let me know if he could get transportation". Nurse read note to patient.  Patient reports he has an appointment today at 2pm and is getting reading to leave to go to appointment now.  Nurse stressed importance of going to appointment today. Patient voices understanding and has no further questions at this time.

## 2015-03-02 NOTE — Progress Notes (Signed)
ICD check in clinic. Normal device function. Threshold and sensing consistent with previous device measurements. Impedance trends stable over time. 79 NSVT---longest w/ EGM 24 beats. Histogram distribution appropriate for patient and level of activity. No changes made this session. Device programmed at appropriate safety margins. Device programmed to optimize intrinsic conduction. Estimated longevity 11.55yrs. Alert tones demonstrated for patient, pt knows to call clinic if heard. ROV w/ SK 05/31/15. Pt will reconnect [facility] once his power is turned back on.

## 2015-03-02 NOTE — Telephone Encounter (Signed)
Pt called in a stated that his ICD vibrated. Requested that pt send a remote transmission from home monitor. Pt said he could not do that b/c his power had just been disconnected. Offered a pt for Wednesday 12/7 or Thursday 12/8 at 2:00 for either day. Pt said he would call back and let me know if he could get transportation.

## 2015-03-02 NOTE — Telephone Encounter (Signed)
Patient called nurset, patient verified date of birth. Patient reports defibrillator does not feel right, patient reports feeling a vibration with defibrillator, "started about 10 minutes ago and only lasted a couple seconds" Patient denies chest pain at this time.  Patient reports this is the first time he has ever felt the vibration. Patient reports having defibrillator checked 3 months ago.  Patient reports he needs to call and make an appointment to have defibrillator checked.  Nurse spoke with Chari Manning. Per Mateo Flow patient needs to call the cardiologist as soon as possible.  Nurse explained to patient importance of calling cardiologist, per chart it appears patient last saw Dr. Ned Grace. Patient can not find telephone number to cardiologist. Nurse gave patient telephone number, [phone], according to AVS printed on 10/20/14. Nurse reiterated importance of calling cardiology now to report feeling vibration and feeling the defibrillator does not feel right. Patient voices understanding and has no further questions at this time.

## 2015-03-06 DIAGNOSIS — G4733 Obstructive sleep apnea (adult) (pediatric): Secondary | ICD-10-CM | POA: Diagnosis not present

## 2015-03-06 NOTE — Progress Notes (Signed)
  Patient Name: [place], [place] Date: 02/23/2015 Gender: Male D.O.B: 06-04-1955 Age (years): 59 Referring Provider: Brandy Hale Height (inches): 71 Interpreting Physician: Baird Lyons MD, ABSM Weight (lbs): 235 RPSGT: Gerhard Perches BMI: 33 MRN: [id number] Neck Size: 17.00 CLINICAL INFORMATION Sleep Study Type: NPSG Indication for sleep study: Excessive Daytime Sleepiness, Fatigue, OSA, Snoring, Witnessed Apneas Epworth Sleepiness Score: 7  SLEEP STUDY TECHNIQUE As per the AASM Manual for the Scoring of Sleep and Associated Events v2.3 (April 2016) with a hypopnea requiring 4% desaturations. The channels recorded and monitored were frontal, central and occipital EEG, electrooculogram (EOG), submentalis EMG (chin), nasal and oral airflow, thoracic and abdominal wall motion, anterior tibialis EMG, snore microphone, electrocardiogram, and pulse oximetry. MEDICATIONS Patient's medications include: charted for review. Medications self-administered by patient during sleep study : No sleep medicine administered.  SLEEP ARCHITECTURE The study was initiated at 11:08:56 PM and ended at 5:13:19 AM. Sleep onset time was 0.0 minutes and the sleep efficiency was 42.6%. The total sleep time was 155.4 minutes. Stage REM latency was 105.4 minutes. The patient spent 21.48% of the night in stage N1 sleep, 69.51% in stage N2 sleep, 0.00% in stage N3 and 9.01% in REM. Alpha intrusion was absent. Supine sleep was 66.29%. Wake after sleep onset 209 minutes  RESPIRATORY PARAMETERS The overall apnea/hypopnea index (AHI) was 56.8 per hour. There were 84 total apneas, including 0 obstructive, 84 central and 0 mixed apneas. There were 63 hypopneas and 0 RERAs. The AHI during Stage REM sleep was 25.7 per hour. AHI while supine was 55.9 per hour. The mean oxygen saturation was 93.25%. The minimum SpO2 during sleep was 83.00%. Soft snoring was noted during this study.  CARDIAC DATA The 2  lead EKG demonstrated sinus rhythm. The mean heart rate was 74.31 beats per minute. Other EKG findings include: PVCs.  LEG MOVEMENT DATA The total PLMS were 0 with a resulting PLMS index of 0.00. Associated arousal with leg movement index was 0.0 .  IMPRESSIONS - Severe obstructive sleep apnea occurred during this study (AHI = 56.8/h). - Moderate central sleep apnea occurred during this study (CAI = 32.4/h). - Mild oxygen desaturation was noted during this study (Min O2 = 83.00%). - The patient snored with Soft snoring volume. - EKG findings include PVCs. - Clinically significant periodic limb movements did not occur during sleep. No significant associated arousals. - Patient woke for bathroom around 01:00 AM and never able to sustain sleep after. Split CPAP titration could not be done. Says this is typical home sleep pattern.  DIAGNOSIS - Obstructive Sleep Apnea (327.23 [G47.33 ICD-10]) - Central Sleep Apnea (327.27 [G47.37 ICD-10]) - Insomnia- difficulty maintaining sleep  RECOMMENDATIONS - CPAP titration to determine optimal pressure required to alleviate sleep disordered breathing. BiPAP or ASV titration may be required to eliminate central sleep apnea if clinically necessary. - Avoid alcohol, sedatives and other CNS depressants that may worsen sleep apnea and disrupt normal sleep architecture. - Sleep hygiene should be reviewed to assess factors that may improve sleep quality. - Weight management and regular exercise should be initiated or continued if appropriate.  Deneise Lever Diplomate, American Board of Sleep Medicine  ELECTRONICALLY SIGNED ON:  03/06/2015, 9:28 AM [place] PH: [phone]   FX: [phone] [place]

## 2015-03-07 ENCOUNTER — Telehealth: Payer: Self-pay

## 2015-03-07 NOTE — Telephone Encounter (Signed)
Attempted to contact patient per provider Patient not available Left message to return our call

## 2015-03-08 ENCOUNTER — Other Ambulatory Visit: Payer: Self-pay | Admitting: Internal Medicine

## 2015-03-08 ENCOUNTER — Telehealth: Payer: Self-pay | Admitting: Pharmacist

## 2015-03-08 ENCOUNTER — Ambulatory Visit: Payer: Medicare HMO | Attending: Family Medicine | Admitting: Pharmacist

## 2015-03-08 DIAGNOSIS — E1142 Type 2 diabetes mellitus with diabetic polyneuropathy: Secondary | ICD-10-CM

## 2015-03-08 LAB — POCT INR: INR: 1.7

## 2015-03-08 MED ORDER — GABAPENTIN 600 MG PO TABS: ORAL_TABLET | ORAL | Status: DC

## 2015-03-08 NOTE — Telephone Encounter (Signed)
Patient called saying that he had received a call from [place] but he wasn't sure what it was concerning.  I did not call him but I know that we were trying to reach him to let him know that we had his [employer] paperwork completed. Patient said that he would pick it up when he sees me next Tuesday, December 20th, for [facility].    Nicoletta Ba, PharmD, BCPS, [facility] [phone]

## 2015-03-15 ENCOUNTER — Encounter: Payer: Medicare HMO | Admitting: Pharmacist

## 2015-03-17 ENCOUNTER — Telehealth: Payer: Self-pay

## 2015-03-17 NOTE — Telephone Encounter (Signed)
Called patient to verify nature of patient s visit Patient states he is coming in for his INR check  Patient will be put on stacey's schedule

## 2015-03-18 ENCOUNTER — Ambulatory Visit: Payer: Medicare HMO | Admitting: Internal Medicine

## 2015-03-22 ENCOUNTER — Other Ambulatory Visit: Payer: Medicare HMO | Admitting: Pharmacist

## 2015-03-22 ENCOUNTER — Ambulatory Visit (HOSPITAL_BASED_OUTPATIENT_CLINIC_OR_DEPARTMENT_OTHER): Payer: Medicare HMO | Attending: Internal Medicine | Admitting: Radiology

## 2015-03-22 VITALS — Ht 71.5 in | Wt 235.0 lb

## 2015-03-22 DIAGNOSIS — I493 Ventricular premature depolarization: Secondary | ICD-10-CM | POA: Diagnosis not present

## 2015-03-22 DIAGNOSIS — G4733 Obstructive sleep apnea (adult) (pediatric): Secondary | ICD-10-CM

## 2015-03-22 DIAGNOSIS — R0683 Snoring: Secondary | ICD-10-CM | POA: Insufficient documentation

## 2015-03-22 DIAGNOSIS — G4737 Central sleep apnea in conditions classified elsewhere: Secondary | ICD-10-CM | POA: Insufficient documentation

## 2015-03-28 DIAGNOSIS — G4733 Obstructive sleep apnea (adult) (pediatric): Secondary | ICD-10-CM | POA: Diagnosis not present

## 2015-03-28 NOTE — Progress Notes (Signed)
   NAME: Thomas Sosa DATE OF BIRTH:  1955-09-16 MEDICAL RECORD NUMBER [id number]  LOCATION: [facility]  PHYSICIAN: Taleah Bellantoni D  DATE OF STUDY: 03/22/2015 CLINICAL INFORMATION The patient is referred for a PAP titration to treat sleep apnea.   Date of NPSG, Split Night or HST:  Diagnostic NPSG 02/23/15,   AHI  56.8/ hr, desat to 83%, body weight 235 lbs  SLEEP STUDY TECHNIQUE As per the AASM Manual for the Scoring of Sleep and Associated Events v2.3 (April 2016) with a hypopnea requiring 4% desaturations. The channels recorded and monitored were frontal, central and occipital EEG, electrooculogram (EOG), submentalis EMG (chin), nasal and oral airflow, thoracic and abdominal wall motion, anterior tibialis EMG, snore microphone, electrocardiogram, and pulse oximetry. Bilevel positive airway pressure (BPAP) was initiated at the beginning of the study and titrated to treat sleep-disordered breathing.  MEDICATIONS Medications taken by the patient : charted for review Medications administered by patient during sleep study : gabapentin  RESPIRATORY PARAMETERS Optimal IPAP Pressure (cm): 17 AHI at Optimal Pressure (/hr) N/A Optimal EPAP Pressure (cm): 11   Overall Minimal O2 (%): 88.00 Minimal O2 at Optimal Pressure (%): 88.00  SLEEP ARCHITECTURE Start Time: 10:41:27 PM Stop Time: 5:18:03 AM Total Time (min): 396.6 Total Sleep Time (min): 337.2 Sleep Latency (min): 11.9 Sleep Efficiency (%): 85.0 REM Latency (min): 216.5 WASO (min): 47.5 Stage N1 (%): 11.71 Stage N2 (%): 78.35 Stage N3 (%): 0.00 Stage R (%): 9.94 Supine (%): 100.00 Arousal Index (/hr): 21.2     CARDIAC DATA The 2 lead EKG demonstrated sinus rhythm. The mean heart rate was 64.06 beats per minute. Other EKG findings include: PVCs.  LEG MOVEMENT DATA The total Periodic Limb Movements of Sleep (PLMS) were 35. The PLMS index was 6.23. A PLMS index of <15 is considered normal in adults.  IMPRESSIONS -  An optimal BiPAP pressure was selected for this patient ( 17 / 11 cm of water) - CPAP pressures were not tolerated. BIPAP was also used in an effort to better control central apneas. - Moderate Central Sleep Apnea was noted during this titration (CAI = 25.4/h). - Mild oxygen desaturations were observed during this titration (min O2 = 88.00%). - The patient snored with Moderate snoring volume. - 2-lead EKG demonstrated: PVCs - Mild periodic limb movements were observed during this study. Arousals associated with PLMs were rare.  DIAGNOSIS - Obstructive Sleep Apnea (327.23 [G47.33 ICD-10]) - Central Sleep Apnea (327.27 [G47.37 ICD-10])  RECOMMENDATIONS - Trial of BiPAP therapy on 17/11 cm H2O with a Large size Fisher[employer]Paykel Full Face Mask Simplus mask and heated humidification. - Avoid alcohol, sedatives and other CNS depressants that may worsen sleep apnea and disrupt normal sleep architecture. - Sleep hygiene should be reviewed to assess factors that may improve sleep quality. - Weight management and regular exercise should be initiated or continued.    Deneise Lever Diplomate, American Board of Sleep Medicine  ELECTRONICALLY SIGNED ON:  03/28/2015, 10:12 AM [place] PH: [phone]   FX: [phone] [place]

## 2015-04-06 ENCOUNTER — Other Ambulatory Visit: Payer: Self-pay | Admitting: *Deleted

## 2015-04-19 ENCOUNTER — Encounter: Payer: Medicare HMO | Admitting: Pharmacist

## 2015-04-19 ENCOUNTER — Ambulatory Visit: Payer: Medicare HMO | Attending: Internal Medicine | Admitting: Pharmacist

## 2015-04-19 ENCOUNTER — Telehealth: Payer: Self-pay | Admitting: Internal Medicine

## 2015-04-19 DIAGNOSIS — I10 Essential (primary) hypertension: Secondary | ICD-10-CM

## 2015-04-19 DIAGNOSIS — E1142 Type 2 diabetes mellitus with diabetic polyneuropathy: Secondary | ICD-10-CM

## 2015-04-19 DIAGNOSIS — I63429 Cerebral infarction due to embolism of unspecified anterior cerebral artery: Secondary | ICD-10-CM

## 2015-04-19 LAB — POCT INR: INR: 1.5

## 2015-04-19 MED ORDER — GABAPENTIN 600 MG PO TABS: ORAL_TABLET | ORAL | Status: DC

## 2015-04-19 MED ORDER — WARFARIN SODIUM 5 MG PO TABS: ORAL_TABLET | ORAL | Status: DC

## 2015-04-19 NOTE — Telephone Encounter (Signed)
Vernon from the sleep disorder center called requesting to know the status of a form that was faxed over last week for the pt. To be able to receive a cpap devise. Please f/u

## 2015-04-26 ENCOUNTER — Other Ambulatory Visit: Payer: Self-pay | Admitting: Internal Medicine

## 2015-04-26 DIAGNOSIS — G4733 Obstructive sleep apnea (adult) (pediatric): Secondary | ICD-10-CM

## 2015-04-27 ENCOUNTER — Telehealth: Payer: Self-pay | Admitting: Internal Medicine

## 2015-04-27 DIAGNOSIS — I639 Cerebral infarction, unspecified: Secondary | ICD-10-CM

## 2015-04-27 DIAGNOSIS — K219 Gastro-esophageal reflux disease without esophagitis: Secondary | ICD-10-CM

## 2015-04-27 DIAGNOSIS — E1142 Type 2 diabetes mellitus with diabetic polyneuropathy: Secondary | ICD-10-CM

## 2015-04-27 DIAGNOSIS — I10 Essential (primary) hypertension: Secondary | ICD-10-CM

## 2015-04-27 MED ORDER — ATORVASTATIN CALCIUM 80 MG PO TABS: 80.0000 mg | ORAL_TABLET | Freq: Every day | ORAL | Status: DC

## 2015-04-27 MED ORDER — CARVEDILOL 6.25 MG PO TABS: 6.2500 mg | ORAL_TABLET | Freq: Two times a day (BID) | ORAL | Status: DC

## 2015-04-27 MED ORDER — OMEPRAZOLE 20 MG PO CPDR: 20.0000 mg | DELAYED_RELEASE_CAPSULE | Freq: Every day | ORAL | Status: DC

## 2015-04-27 MED ORDER — POLYETHYLENE GLYCOL 3350 17 G PO PACK: 17.0000 g | PACK | Freq: Two times a day (BID) | ORAL | Status: AC

## 2015-04-27 MED ORDER — LOSARTAN POTASSIUM 50 MG PO TABS: 50.0000 mg | ORAL_TABLET | Freq: Every day | ORAL | Status: DC

## 2015-04-27 MED ORDER — GABAPENTIN 600 MG PO TABS: ORAL_TABLET | ORAL | Status: DC

## 2015-04-27 MED ORDER — GLIPIZIDE ER 5 MG PO TB24: 5.0000 mg | ORAL_TABLET | Freq: Every day | ORAL | Status: DC

## 2015-04-27 NOTE — Telephone Encounter (Signed)
Patient needs refills on his 13 medication.Thomas KitchenMarland KitchenMarland KitchenMarland Kitchenplease send refills to [facility] (pharmacy on profile)

## 2015-05-05 ENCOUNTER — Telehealth: Payer: Self-pay | Admitting: Pulmonary Disease

## 2015-05-05 NOTE — Telephone Encounter (Signed)
Called over to sleep center and they are now closed. WCB in AM

## 2015-05-06 NOTE — Telephone Encounter (Signed)
Attempted to call the sleep lab. No answer, line rang several times. Will try back.

## 2015-05-09 NOTE — Telephone Encounter (Signed)
Pt. Called requesting to know the status of the form that was faxed over for him to be able to receive a cpap devise. Please f/u with pt/

## 2015-05-09 NOTE — Telephone Encounter (Signed)
Spoke with Kia at the sleep lab. They are wanting to know if RA would be willing to sign the orders on pt's BiPAP. His PCP is not responding to their calls about this. Pt has a sleep consult with RA on 08/09/15.  RA - please advise. Thanks.

## 2015-05-09 NOTE — Telephone Encounter (Signed)
Called and left message with Tyra to have Aia return my call.

## 2015-05-09 NOTE — Telephone Encounter (Signed)
Kia returned call  Call back at [phone]

## 2015-05-10 ENCOUNTER — Telehealth: Payer: Self-pay

## 2015-05-10 NOTE — Telephone Encounter (Signed)
I called spoke with Coralyn Mark over at the sleep lab. She will inform Kia RA unable to sign orders since not seen in office. Will sign off message

## 2015-05-10 NOTE — Telephone Encounter (Signed)
Called and spoke with patient this am  Patient is aware his primary does not fill out paper work for [employer] That form need to be filled out by pulmonology Referral was placed in epic and patient is aware their office will  Contact him to set up the appointment

## 2015-05-10 NOTE — Telephone Encounter (Signed)
Cannot sign CMN for a pt we have not seen Can try to get him earlier with me, dr dedios or other sleep doc

## 2015-05-17 ENCOUNTER — Encounter: Payer: Medicare HMO | Admitting: Pharmacist

## 2015-05-31 ENCOUNTER — Ambulatory Visit (INDEPENDENT_AMBULATORY_CARE_PROVIDER_SITE_OTHER): Payer: Medicare HMO | Admitting: Internal Medicine

## 2015-05-31 ENCOUNTER — Other Ambulatory Visit: Payer: Self-pay | Admitting: Internal Medicine

## 2015-05-31 ENCOUNTER — Encounter: Payer: Self-pay | Admitting: Internal Medicine

## 2015-05-31 VITALS — BP 162/96 | HR 90 | Ht 71.5 in | Wt 257.8 lb

## 2015-05-31 DIAGNOSIS — IMO0001 Reserved for inherently not codable concepts without codable children: Secondary | ICD-10-CM

## 2015-05-31 DIAGNOSIS — I639 Cerebral infarction, unspecified: Secondary | ICD-10-CM | POA: Diagnosis not present

## 2015-05-31 DIAGNOSIS — E1165 Type 2 diabetes mellitus with hyperglycemia: Principal | ICD-10-CM

## 2015-05-31 DIAGNOSIS — I255 Ischemic cardiomyopathy: Secondary | ICD-10-CM

## 2015-05-31 DIAGNOSIS — I5022 Chronic systolic (congestive) heart failure: Secondary | ICD-10-CM

## 2015-05-31 DIAGNOSIS — Z95810 Presence of automatic (implantable) cardiac defibrillator: Secondary | ICD-10-CM

## 2015-05-31 DIAGNOSIS — Z794 Long term (current) use of insulin: Principal | ICD-10-CM

## 2015-05-31 LAB — CUP PACEART INCLINIC DEVICE CHECK
Battery Remaining Longevity: 135 mo
Date Time Interrogation Session: 20170307164745
HIGH POWER IMPEDANCE MEASURED VALUE: 63 Ohm
Implantable Lead Implant Date: 20160720
Lead Channel Impedance Value: 342 Ohm
Lead Channel Pacing Threshold Amplitude: 0.875 V
Lead Channel Pacing Threshold Pulse Width: 0.4 ms
Lead Channel Sensing Intrinsic Amplitude: 14 mV
Lead Channel Sensing Intrinsic Amplitude: 22.625 mV
MDC IDC LEAD LOCATION: 753860
MDC IDC MSMT BATTERY VOLTAGE: 3.07 V
MDC IDC MSMT LEADCHNL RV IMPEDANCE VALUE: 456 Ohm
MDC IDC SET LEADCHNL RV PACING AMPLITUDE: 2 V
MDC IDC SET LEADCHNL RV PACING PULSEWIDTH: 0.4 ms
MDC IDC SET LEADCHNL RV SENSING SENSITIVITY: 0.3 mV
MDC IDC STAT BRADY RV PERCENT PACED: 0 %

## 2015-05-31 MED ORDER — CARVEDILOL 12.5 MG PO TABS: 12.5000 mg | ORAL_TABLET | Freq: Two times a day (BID) | ORAL | Status: DC

## 2015-05-31 MED ORDER — ISOSORB DINITRATE-HYDRALAZINE 20-37.5 MG PO TABS: 1.0000 | ORAL_TABLET | Freq: Two times a day (BID) | ORAL | Status: DC

## 2015-05-31 NOTE — Patient Instructions (Addendum)
Medication Instructions: 1) Increase coreg (carvedilol) to 12.5 mg one tablet by mouth twice daily 2) Start Bidil 20/37.5 mg one tablet by mouth twice daily  Labwork: - Your physician recommends that you have lab work today: [employer]  Procedures/Testing: - none  Follow-Up: - Your physician recommends that you schedule a follow-up appointment in: 2-4 weeks with Dr. Gwenlyn Found the PA/ NP at the [facility] office  - Remote monitoring is used to monitor your Pacemaker of ICD from home. This monitoring reduces the number of office visits required to check your device to one time per year. It allows [place] to keep an eye on the functioning of your device to ensure it is working properly. You are scheduled for a device check from home on 08/30/15. You may send your transmission at any time that day. If you have a wireless device, the transmission will be sent automatically. After your physician reviews your transmission, you will receive a postcard with your next transmission date.  - Your physician wants you to follow-up in: 6 months with Chanetta Marshall, NP for Dr. Caryl Comes. You will receive a reminder letter in the mail two months in advance. If you don't receive a letter, please call our office to schedule the follow-up appointment.  Any Additional Special Instructions Will Be Listed Below (If Applicable).     If you need a refill on your cardiac medications before your next appointment, please call your pharmacy.

## 2015-05-31 NOTE — Progress Notes (Signed)
Denise please call patient and let him know that I have placed a endocrinology referral per his Cardiologist for uncontrolled diabetes. It is time for him to follow up with me regarding his diabetes in the meantime. Please bring sugar log to appointment

## 2015-05-31 NOTE — Progress Notes (Signed)
Patient Care Team: [name], NP as PCP - General (Internal Medicine)   HPI  Thomas Sosa is a 60 y.o. male  seen in follow-up for ICD implanted 7/16 for syncope in the context of ischemic cardiomyopathy, nonsustained ventricular tachycardia and a cryptogenic stroke. A Medtronic device was utilized with AF diagnostics.  Records and Results Reviewed* hospitalized 10/16 with hypertension and chest pain. Troponins were negative and he was discharged on medical therapy   Past Medical History  Diagnosis Date  . Hypertension   . Diabetes mellitus without complication ([place])   . Diabetic neuropathy ([place]) Dx 2011  . Cancer of prostate ([place]) Dx 2005  . Hyperlipidemia   . Coronary artery disease   . Tobacco abuse   . Peripheral arterial disease ([place])   . Stroke ([place])   . Hemiparesis and alteration of sensations as late effects of stroke ([place]) 02/28/2015    Past Surgical History  Procedure Laterality Date  . Replacement total knee  Lt 2013/ Rt 2005  . Ep implantable device N/A 10/13/2014    Procedure: ICD Implant;  Surgeon: Deboraha Sprang, MD;  Location: [facility];  Service: Cardiovascular;  Laterality: N/A;  . Tee without cardioversion N/A 10/11/2014    Procedure: TRANSESOPHAGEAL ECHOCARDIOGRAM (TEE);  Surgeon: Dorothy Spark, MD;  Location: [facility] END[facility]COPY;  Service: Cardiovascular;  Laterality: N/A;    Current Outpatient Prescriptions  Medication Sig Dispense Refill  . amitriptyline (ELAVIL) 50 MG tablet Take 50 mg by mouth at bedtime.    [name] aspirin 81 MG EC tablet Take 1 tablet (81 mg total) by mouth daily. 30 tablet 0  . atorvastatin (LIPITOR) 80 MG tablet Take 1 tablet (80 mg total) by mouth daily. 30 tablet 4  . carvedilol (COREG) 6.25 MG tablet Take 1 tablet (6.25 mg total) by mouth 2 (two) times daily with a meal. 60 tablet 4  . gabapentin (NEURONTIN) 600 MG tablet take 1 and 1/2 tablets by mouth three times a day 135 tablet 1  . glipiZIDE (GLUCOTROL XL) 5 MG  24 hr tablet Take 1 tablet (5 mg total) by mouth daily with breakfast. 30 tablet 2  . HYDROcodone-acetaminophen (NORCO/VICODIN) 5-325 MG tablet Take 1-2 tablets by mouth every 6 (six) hours as needed for moderate pain. 14 tablet 0  . insulin aspart (NOVOLOG) 100 UNIT/ML injection Inject 10 Units into the skin 3 (three) times daily before meals. < 400 = 14 units; > 400 = call MD 10 mL 11  . insulin detemir (LEVEMIR) 100 UNIT/ML injection Inject 0.35 mLs (35 Units total) into the skin 2 (two) times daily. 10 mL 11  . losartan (COZAAR) 50 MG tablet Take 1 tablet (50 mg total) by mouth daily. 30 tablet 4  . omeprazole (PRILOSEC) 20 MG capsule Take 1 capsule (20 mg total) by mouth daily. Acid reflux 30 capsule 4  . ONE TOUCH ULTRA TEST test strip TEST four times a day 100 each 6  . oxyCODONE-acetaminophen (PERCOCET/ROXICET) 5-325 MG tablet Take 1 tablet by mouth every 8 (eight) hours as needed.    . penicillin v potassium (VEETID) 500 MG tablet Take 1 tablet (500 mg total) by mouth 4 (four) times daily. 40 tablet 0  . polyethylene glycol (MIRALAX / GLYCOLAX) packet Take 17 g by mouth 2 (two) times daily. 14 each 0  . traMADol (ULTRAM) 50 MG tablet Take 2 tablets (100 mg total) by mouth every 6 (six) hours as needed for moderate pain. [place]  tablet 0  . warfarin (COUMADIN) 5 MG tablet Take 2 tablets (10 mg) everyday except Sunday take 1.5 tablets (7.5 mg) 60 tablet 3   No current facility-administered medications for this visit.    Allergies  Allergen Reactions  . Lisinopril Cough      Review of Systems negative except from HPI and PMH  Physical Exam BP 162/96 mmHg  Pulse 90  Ht 5' 11.5" (1.816 m)  Wt 257 lb 12.8 oz (116.937 kg)  BMI 35.46 kg/m2 Well developed and well nourished in no acute distress HENT normal E scleral and icterus clear Neck Supple JVP flat; carotids brisk and full Clear to ausculation  Regular rate and rhythm, no murmurs gallops or rub Soft with active bowel sounds No  clubbing cyanosis  Edema Alert and oriented, grossly normal motor and sensory function Skin Warm and Dry  ECG demonstrates sinus rhythm at 90 Intervals 16/11/40 Frequent PVCs   Assessment and  Plan  Ischemic Cardiomyopathy  ICD  Medtronic  The patient's device was interrogated and the information was fully reviewed.  The device was reprogrammed to minimize inappropriate detections of atrial fibrillation   Cryptogenic stroke  Ventricular tachycardia  PVCs  Hypertension   Diabetes poorly controlled  He is polyuric. He says his blood sugars have been over 400. I will be in touch with his PCP.  As relates to his heart failure, he is currently euvolemic but he still has significant dyspnea on exertion. In the context of his cardiomyopathy will increase his carvedilol 6.25--12.5. We'll begin him on BiDil for augmented control of his blood pressure. He may well be a candidate for [employer].  We will arrange for follow-up with Dr. Hiram Comber his primary cardiologist.  He is noted to have nonsustained VT; there've been no therapy delivered by his device

## 2015-06-01 ENCOUNTER — Telehealth: Payer: Self-pay | Admitting: Internal Medicine

## 2015-06-01 ENCOUNTER — Telehealth: Payer: Self-pay

## 2015-06-01 ENCOUNTER — Telehealth: Payer: Self-pay | Admitting: *Deleted

## 2015-06-01 LAB — BASIC METABOLIC PANEL
BUN: 22 mg/dL (ref 7–25)
CALCIUM: 8.7 mg/dL (ref 8.6–10.3)
CO2: 25 mmol/L (ref 20–31)
CREATININE: 0.92 mg/dL (ref 0.70–1.25)
Chloride: 100 mmol/L (ref 98–110)
GLUCOSE: 311 mg/dL — AB (ref 65–99)
Potassium: 3.8 mmol/L (ref 3.5–5.3)
Sodium: 138 mmol/L (ref 135–146)

## 2015-06-01 NOTE — Telephone Encounter (Signed)
Bidil approved through 03/25/2016. Referral # [id number].

## 2015-06-01 NOTE — Telephone Encounter (Signed)
Patient informed, Bidil approved.

## 2015-06-01 NOTE — Telephone Encounter (Signed)
Prior auth for Bidil submitted today.

## 2015-06-01 NOTE — Telephone Encounter (Signed)
New message  Pt c/o medication issue: 1. Name of Medication: isosorbide-hydrALAZINE (BIDIL) 20-37.5 MG tablet   4. What is your medication issue? Pt called states that this is medication is not covered by his insurance.

## 2015-06-01 NOTE — Telephone Encounter (Signed)
Prior auth for Bidil 20/37.5 sent to [facility].

## 2015-06-01 NOTE — Telephone Encounter (Signed)
approval over as well. Spoke with Danton Clap and she confirmed they had the approval but he has to meet his deductiable, ask her to call & explain that to pt. she agreed.

## 2015-06-02 ENCOUNTER — Telehealth: Payer: Self-pay | Admitting: Internal Medicine

## 2015-06-02 NOTE — Telephone Encounter (Signed)
I called the patient with his lab results. He states that he cannot afford the Bi-Dil that Dr. Caryl Comes wanted to start him on- this will be over $200/ month. I advised I will forward to MD to review and we will be back in touch with him.

## 2015-06-09 NOTE — Telephone Encounter (Signed)
lmtcb

## 2015-06-09 NOTE — Telephone Encounter (Signed)
Let him look at hydral 25 bid and isor dinitrate 40 bid

## 2015-06-15 ENCOUNTER — Ambulatory Visit (INDEPENDENT_AMBULATORY_CARE_PROVIDER_SITE_OTHER): Payer: Medicare HMO | Admitting: Cardiology

## 2015-06-15 ENCOUNTER — Encounter: Payer: Self-pay | Admitting: Cardiology

## 2015-06-15 VITALS — BP 94/70 | HR 76 | Ht 71.5 in | Wt 250.5 lb

## 2015-06-15 DIAGNOSIS — Z951 Presence of aortocoronary bypass graft: Secondary | ICD-10-CM

## 2015-06-15 DIAGNOSIS — Z7901 Long term (current) use of anticoagulants: Secondary | ICD-10-CM | POA: Insufficient documentation

## 2015-06-15 DIAGNOSIS — Z95810 Presence of automatic (implantable) cardiac defibrillator: Secondary | ICD-10-CM

## 2015-06-15 DIAGNOSIS — I255 Ischemic cardiomyopathy: Secondary | ICD-10-CM | POA: Diagnosis not present

## 2015-06-15 DIAGNOSIS — K089 Disorder of teeth and supporting structures, unspecified: Secondary | ICD-10-CM

## 2015-06-15 DIAGNOSIS — I63549 Cerebral infarction due to unspecified occlusion or stenosis of unspecified cerebellar artery: Secondary | ICD-10-CM

## 2015-06-15 DIAGNOSIS — I739 Peripheral vascular disease, unspecified: Secondary | ICD-10-CM

## 2015-06-15 DIAGNOSIS — I5022 Chronic systolic (congestive) heart failure: Secondary | ICD-10-CM | POA: Diagnosis not present

## 2015-06-15 DIAGNOSIS — E1142 Type 2 diabetes mellitus with diabetic polyneuropathy: Secondary | ICD-10-CM

## 2015-06-15 MED ORDER — HYDRALAZINE HCL 25 MG PO TABS: 25.0000 mg | ORAL_TABLET | Freq: Two times a day (BID) | ORAL | Status: DC

## 2015-06-15 MED ORDER — ISOSORBIDE DINITRATE 20 MG PO TABS: 20.0000 mg | ORAL_TABLET | Freq: Two times a day (BID) | ORAL | Status: DC

## 2015-06-15 NOTE — Assessment & Plan Note (Signed)
He essentially needs all his teeth pulled

## 2015-06-15 NOTE — Assessment & Plan Note (Signed)
Currently compensated. He has trouble affording medications

## 2015-06-15 NOTE — Assessment & Plan Note (Signed)
Uncontrolled DM with neuropathy- now followed at pain clinic for leg/foot pain

## 2015-06-15 NOTE — Progress Notes (Signed)
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 He had a CABG in [place] in 2013 (no details). He was referred to Dr Gwenlyn Found who saw the patient once in Dec 2015 for PV evaluation as a referral l from a podiatrist.  LE dopplers suggested bilat LE disease with ABI of 0.78 on Rt, 0.65 on Lt. Dr Gwenlyn Found felt there was no critical ischemia and no interventional options anyway. The pt was next seen in July 2016 when he had a stroke. We were asked to do a TEE- EF 25-30%. He had some NSVT in the hospital and a MDT ICD was placed. In Oct 2016 he was admitted with uncontrolled DM. He is referred now back to Dr Gwenlyn Found for primary cardiology follow up.           The pt tells me his main compliant is his chronic leg pain. His legs and feet hurt all the time. He has recently been put on Percocet which helps some. I get the impression he is concerned the pain is secondary to poor circulation.  From a cardiac standpoint he says he is doing OK, chronic DOE, no angina, no unstable CHF symptoms. Dr Caryl Comes had suggested possibly Entresto but the pt is non compliant with several of his medications including BiDil and Cozaar secondary to cost, he doesn't seem to be a good candidate for Entresto at this time.     Current Outpatient Prescriptions  Medication Sig Dispense Refill  . amitriptyline (ELAVIL) 50 MG tablet Take 50 mg by mouth at bedtime.    [name] aspirin 81 MG EC tablet Take 1 tablet (81 mg total) by mouth daily. 30 tablet 0  . atorvastatin (LIPITOR) 80 MG tablet Take 1 tablet (80 mg total) by mouth daily. 30 tablet 4  . carvedilol (COREG) 12.5 MG tablet Take 1 tablet (12.5 mg total) by mouth 2 (two) times daily. 60 tablet 6  . gabapentin (NEURONTIN) 600 MG tablet take 1 and 1/2 tablets by mouth three times a day 135 tablet 1  . glipiZIDE (GLUCOTROL XL) 5 MG 24 hr tablet Take 1  tablet (5 mg total) by mouth daily with breakfast. 30 tablet 2  . HYDROcodone-acetaminophen (NORCO/VICODIN) 5-325 MG tablet Take 1-2 tablets by mouth every 6 (six) hours as needed for moderate pain. 14 tablet 0  . insulin aspart (NOVOLOG) 100 UNIT/ML injection Inject 10 Units into the skin 3 (three) times daily before meals. < 400 = 14 units; > 400 = call MD 10 mL 11  . insulin detemir (LEVEMIR) 100 UNIT/ML injection Inject 0.35 mLs (35 Units total) into the skin 2 (two) times daily. 10 mL 11  . omeprazole (PRILOSEC) 20 MG capsule Take 1 capsule (20 mg total) by mouth daily. Acid reflux 30 capsule 4  . ONE TOUCH ULTRA TEST test strip TEST four times a day 100 each 6  . oxyCODONE-acetaminophen (PERCOCET/ROXICET) 5-325 MG tablet Take 1 tablet by mouth every 8 (eight) hours as needed.    . penicillin v potassium (VEETID) 500 MG tablet Take 1 tablet (500 mg total) by mouth 4 (four) times daily. 40 tablet 0  . polyethylene glycol (MIRALAX / GLYCOLAX) packet Take 17 g by mouth 2 (two) times daily. 14 each 0  . traMADol (ULTRAM) 50 MG tablet Take 2 tablets (100 mg total) by mouth  every 6 (six) hours as needed for moderate pain. 30 tablet 0  . warfarin (COUMADIN) 5 MG tablet Take 2 tablets (10 mg) everyday except Sunday take 1.5 tablets (7.5 mg) 60 tablet 3  . hydrALAZINE (APRESOLINE) 25 MG tablet Take 1 tablet (25 mg total) by mouth 2 (two) times daily. 60 tablet 11  . isosorbide dinitrate (ISORDIL) 20 MG tablet Take 1 tablet (20 mg total) by mouth 2 (two) times daily. 60 tablet 11   No current facility-administered medications for this visit.    Allergies  Allergen Reactions  . Lisinopril Cough    Social History   Social History  . Marital Status: Married    Spouse Name: N/A  . Number of Children: 10  . Years of Education: 12   Occupational History  . disability    Social History Main Topics  . Smoking status: Former Smoker    Quit date: 03/26/2014  . Smokeless tobacco: Never Used  .  Alcohol Use: No  . Drug Use: No  . Sexual Activity: Not on file   Other Topics Concern  . Not on file   Social History Narrative   Patient drinks about 10 cups of caffeine daily.   Patient is right handed.      Review of Systems: General: negative for chills, fever, night sweats or weight changes.  Cardiovascular: negative for chest pain, dyspnea on exertion, edema, orthopnea, palpitations, paroxysmal nocturnal dyspnea or shortness of breath Dermatological: negative for rash Respiratory: negative for cough or wheezing Urologic: negative for hematuria Abdominal: negative for nausea, vomiting, diarrhea, bright red blood per rectum, melena, or hematemesis Neurologic: negative for visual changes, syncope, or dizziness All other systems reviewed and are otherwise negative except as noted above.    Blood pressure 94/70, pulse 76, height 5' 11.5" (1.816 m), weight 250 lb 8 oz (113.626 kg).  General appearance: alert, cooperative, no distress, moderately obese and poor dentition Neck: no carotid bruit and no JVD Lungs: clear to auscultation bilaterally Heart: regular rate and rhythm Abdomen: obese Extremities: both LE warm, no significant edema, mild skin changes, decreased pulses bilateraly, no open wounds  Pulses: diminnished Skin: Skin color, texture, turgor normal. 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 He has not had angina  Systolic CHF, chronic (HCC) Currently compensated. He has trouble affording medications  Peripheral arterial disease Significant PVD by dopplers Dec 2015- medical Rx per Dr Gwenlyn Found  Stroke July 2016  Type 2 diabetes mellitus with peripheral neuropathy ([place]) Uncontrolled DM with neuropathy- now followed at pain clinic for leg/foot pain  ICD- MDT- implanted July 2016 Dr Caryl Comes follows  Poor dentition He essentially needs  all his teeth pulled  Chronic anticoagulation with Coumadin For stroke- PCP follows  PLAN  Thomas Sosa is pleasant (he gave me a recipe for cheesecake) but noncompliant with medications. He di say he is not smoking. I suggested we try low Isordil and Hydralazine separately as generics. His B/P is a little low, I don't think he can tolerate Cozaar. We'll see how compliant he is and f/u him up in a few months.   I reviewed his LEA dopplers from Dec 2015 with Dr Gwenlyn Found- he again indicated there was nothing he could offer and would not consider further evaluation unless there was evidence of critical limb ischemia. I discussed this with the pt. I also suggested  to the pt that his symptoms were more consistent with diabetic neuropathy.   He also needs all his teeth pulled. I tried to call the [facility] office but it was too late, the pt will try and contact them.   Kerin Ransom K PA-C 06/15/2015 5:10 PM

## 2015-06-15 NOTE — Assessment & Plan Note (Signed)
Significant PVD by dopplers Dec 2015- medical Rx per Dr Gwenlyn Found

## 2015-06-15 NOTE — Assessment & Plan Note (Signed)
July 2016

## 2015-06-15 NOTE — Assessment & Plan Note (Signed)
In [place] 2013 (?) no details. He has not had angina

## 2015-06-15 NOTE — Assessment & Plan Note (Signed)
Dr Klein follows 

## 2015-06-15 NOTE — Assessment & Plan Note (Signed)
EF 25-30% by echo July 2016

## 2015-06-15 NOTE — Patient Instructions (Addendum)
Medication Instructions:  STOP LOSARTAN   STOP BIDIL   START ISOSORBIDE 20 MG TWICE A DAY   START HYDRALAZINE 25 MG TWICE A DAY  Labwork: NONE  Testing/Procedures: NONE  Follow-Up: Your physician recommends that you schedule a follow-up appointment in: 3 MONTH OV WITH LUKE K [place]  Any Other Special Instructions Will Be Listed Below (If Applicable). WILL TRY TO CALL THE DENTAL CLINIC AT [place] AND TO SEE IF CAN GET A NAME OF DENTIST YOU CAN SEE, OFFICE CURRENTLY CLOSED ([phone])   If you need a refill on your cardiac medications before your next appointment, please call your pharmacy.

## 2015-06-16 NOTE — Telephone Encounter (Signed)
I spoke with the patient at his alternate #. I didn't realize until on the phone with him that he saw Kerin Ransom, [place] yesterday. Lurena Joiner started him on isosorbide and hydralazine at that visit.

## 2015-06-16 NOTE — Telephone Encounter (Signed)
I attempted to call the patient.  Voice mail is full at his preferred #.

## 2015-06-23 ENCOUNTER — Encounter: Payer: Self-pay | Admitting: Endocrinology

## 2015-07-11 ENCOUNTER — Other Ambulatory Visit: Payer: Self-pay | Admitting: Internal Medicine

## 2015-07-12 ENCOUNTER — Ambulatory Visit: Payer: Medicare HMO | Attending: Internal Medicine | Admitting: Pharmacist

## 2015-07-12 DIAGNOSIS — E1142 Type 2 diabetes mellitus with diabetic polyneuropathy: Secondary | ICD-10-CM

## 2015-07-12 DIAGNOSIS — I63429 Cerebral infarction due to embolism of unspecified anterior cerebral artery: Secondary | ICD-10-CM

## 2015-07-12 LAB — POCT INR: INR: 1

## 2015-07-12 MED ORDER — WARFARIN SODIUM 5 MG PO TABS: ORAL_TABLET | ORAL | Status: DC

## 2015-07-12 MED ORDER — GLIPIZIDE ER 5 MG PO TB24: 5.0000 mg | ORAL_TABLET | Freq: Every day | ORAL | Status: DC

## 2015-07-19 ENCOUNTER — Telehealth: Payer: Self-pay | Admitting: Cardiology

## 2015-07-19 NOTE — Telephone Encounter (Signed)
Pt called the office and stated that he will be moving back to [place] for a year or two. He wanted to know what he needed to do about his ICD. I instructed pt that we can follow his ICD until he found a new EP in [place]. Pt verbalized understanding and stated that once he had more information he would call us back .

## 2015-08-09 ENCOUNTER — Institutional Professional Consult (permissible substitution): Payer: Medicare HMO | Admitting: Pulmonary Disease

## 2015-08-17 ENCOUNTER — Encounter: Payer: Self-pay | Admitting: Pulmonary Disease

## 2015-08-23 ENCOUNTER — Encounter: Payer: Medicare HMO | Admitting: Pharmacist

## 2015-08-29 ENCOUNTER — Ambulatory Visit (INDEPENDENT_AMBULATORY_CARE_PROVIDER_SITE_OTHER): Payer: Medicare HMO | Admitting: Neurology

## 2015-08-29 ENCOUNTER — Encounter: Payer: Self-pay | Admitting: Neurology

## 2015-08-29 ENCOUNTER — Telehealth: Payer: Self-pay

## 2015-08-29 DIAGNOSIS — R269 Unspecified abnormalities of gait and mobility: Secondary | ICD-10-CM

## 2015-08-29 DIAGNOSIS — I639 Cerebral infarction, unspecified: Secondary | ICD-10-CM

## 2015-08-29 NOTE — Telephone Encounter (Signed)
Pt no showed f/u appt today.  

## 2015-08-29 NOTE — Progress Notes (Signed)
  The patient did not show for a revisit appointment today.

## 2015-08-30 ENCOUNTER — Telehealth: Payer: Self-pay | Admitting: Internal Medicine

## 2015-08-30 ENCOUNTER — Telehealth: Payer: Self-pay | Admitting: Cardiology

## 2015-08-30 ENCOUNTER — Ambulatory Visit (INDEPENDENT_AMBULATORY_CARE_PROVIDER_SITE_OTHER): Payer: Medicare HMO | Admitting: *Deleted

## 2015-08-30 DIAGNOSIS — I255 Ischemic cardiomyopathy: Secondary | ICD-10-CM

## 2015-08-30 NOTE — Telephone Encounter (Signed)
LMOM that we have received 6 transmissions today- he can stop sending manual transmissions. Gave device clinic # if he has further questions.

## 2015-08-30 NOTE — Telephone Encounter (Signed)
Spoke with pt and reminded pt of remote transmission that is due today. Pt verbalized understanding.   

## 2015-08-30 NOTE — Telephone Encounter (Signed)
New message      1. Has your device fired? No -   2. Is you device beeping? No   3. Are you experiencing draining or swelling at device site? No   4. Are you calling to see if we received your device transmission?  Patient stating he dong his transmission X 3 keep inform him to do it again.    5. Have you passed out? No

## 2015-08-30 NOTE — Progress Notes (Signed)
Remote ICD transmission.   

## 2015-08-31 ENCOUNTER — Encounter: Payer: Self-pay | Admitting: Neurology

## 2015-09-08 LAB — CUP PACEART REMOTE DEVICE CHECK
Battery Remaining Longevity: 134 mo
Battery Voltage: 3.03 V
HighPow Impedance: 59 Ohm
Implantable Lead Implant Date: 20160720
Lead Channel Pacing Threshold Pulse Width: 0.4 ms
Lead Channel Sensing Intrinsic Amplitude: 19.25 mV
Lead Channel Setting Pacing Amplitude: 2 V
Lead Channel Setting Pacing Pulse Width: 0.4 ms
Lead Channel Setting Sensing Sensitivity: 0.3 mV
MDC IDC LEAD LOCATION: 753860
MDC IDC MSMT LEADCHNL RV IMPEDANCE VALUE: 342 Ohm
MDC IDC MSMT LEADCHNL RV IMPEDANCE VALUE: 399 Ohm
MDC IDC MSMT LEADCHNL RV PACING THRESHOLD AMPLITUDE: 0.625 V
MDC IDC MSMT LEADCHNL RV SENSING INTR AMPL: 19.25 mV
MDC IDC SESS DTM: 20170606172018
MDC IDC STAT BRADY RV PERCENT PACED: 0 %

## 2015-09-14 ENCOUNTER — Emergency Department (HOSPITAL_COMMUNITY)
Admission: EM | Admit: 2015-09-14 | Discharge: 2015-09-14 | Disposition: A | Discharge status: Home / Self Care | Payer: Medicare HMO | Attending: Emergency Medicine | Admitting: Emergency Medicine

## 2015-09-14 ENCOUNTER — Emergency Department (HOSPITAL_COMMUNITY): Payer: Medicare HMO

## 2015-09-14 ENCOUNTER — Encounter (HOSPITAL_COMMUNITY): Payer: Self-pay | Admitting: Emergency Medicine

## 2015-09-14 ENCOUNTER — Encounter: Payer: Self-pay | Admitting: Cardiology

## 2015-09-14 DIAGNOSIS — Z96653 Presence of artificial knee joint, bilateral: Secondary | ICD-10-CM | POA: Diagnosis not present

## 2015-09-14 DIAGNOSIS — I10 Essential (primary) hypertension: Secondary | ICD-10-CM | POA: Insufficient documentation

## 2015-09-14 DIAGNOSIS — I2510 Atherosclerotic heart disease of native coronary artery without angina pectoris: Secondary | ICD-10-CM | POA: Diagnosis not present

## 2015-09-14 DIAGNOSIS — Z7984 Long term (current) use of oral hypoglycemic drugs: Secondary | ICD-10-CM | POA: Insufficient documentation

## 2015-09-14 DIAGNOSIS — S43401A Unspecified sprain of right shoulder joint, initial encounter: Secondary | ICD-10-CM | POA: Insufficient documentation

## 2015-09-14 DIAGNOSIS — Z8673 Personal history of transient ischemic attack (TIA), and cerebral infarction without residual deficits: Secondary | ICD-10-CM | POA: Diagnosis not present

## 2015-09-14 DIAGNOSIS — Z79899 Other long term (current) drug therapy: Secondary | ICD-10-CM | POA: Diagnosis not present

## 2015-09-14 DIAGNOSIS — S161XXA Strain of muscle, fascia and tendon at neck level, initial encounter: Secondary | ICD-10-CM | POA: Diagnosis not present

## 2015-09-14 DIAGNOSIS — Z7901 Long term (current) use of anticoagulants: Secondary | ICD-10-CM | POA: Diagnosis not present

## 2015-09-14 DIAGNOSIS — E1140 Type 2 diabetes mellitus with diabetic neuropathy, unspecified: Secondary | ICD-10-CM | POA: Insufficient documentation

## 2015-09-14 DIAGNOSIS — Z87891 Personal history of nicotine dependence: Secondary | ICD-10-CM | POA: Insufficient documentation

## 2015-09-14 DIAGNOSIS — W2209XA Striking against other stationary object, initial encounter: Secondary | ICD-10-CM | POA: Diagnosis not present

## 2015-09-14 DIAGNOSIS — Y929 Unspecified place or not applicable: Secondary | ICD-10-CM | POA: Insufficient documentation

## 2015-09-14 DIAGNOSIS — S53401A Unspecified sprain of right elbow, initial encounter: Secondary | ICD-10-CM | POA: Insufficient documentation

## 2015-09-14 DIAGNOSIS — Y939 Activity, unspecified: Secondary | ICD-10-CM | POA: Insufficient documentation

## 2015-09-14 DIAGNOSIS — Z7982 Long term (current) use of aspirin: Secondary | ICD-10-CM | POA: Diagnosis not present

## 2015-09-14 DIAGNOSIS — S199XXA Unspecified injury of neck, initial encounter: Secondary | ICD-10-CM | POA: Diagnosis present

## 2015-09-14 DIAGNOSIS — Z794 Long term (current) use of insulin: Secondary | ICD-10-CM | POA: Insufficient documentation

## 2015-09-14 DIAGNOSIS — Z8546 Personal history of malignant neoplasm of prostate: Secondary | ICD-10-CM | POA: Insufficient documentation

## 2015-09-14 DIAGNOSIS — W19XXXA Unspecified fall, initial encounter: Secondary | ICD-10-CM

## 2015-09-14 DIAGNOSIS — Y999 Unspecified external cause status: Secondary | ICD-10-CM | POA: Insufficient documentation

## 2015-09-14 MED ORDER — METHOCARBAMOL 500 MG PO TABS: 500.0000 mg | ORAL_TABLET | Freq: Two times a day (BID) | ORAL | Status: AC

## 2015-09-14 NOTE — ED Notes (Signed)
Patient alert and oriented at discharge.  Patient driving home.

## 2015-09-14 NOTE — ED Notes (Signed)
Pt states 4 days ago he fell and hit his neck on a refrigerator. Pt states he has 2 knee replacements and sometimes his knees just give out and that's why he fell. Pt denies any loss conscious. Pt reports pain when turning neck side to side.  Pt denies any dizziness.

## 2015-09-14 NOTE — Discharge Instructions (Signed)
Take robaxin as prescribed for spasms and neck pain. Do not drive if taking this medication. ACE wrap for swelling. Please follow up with your doctor for recheck.    Cervical Sprain A cervical sprain is an injury in the neck in which the strong, fibrous tissues (ligaments) that connect your neck bones stretch or tear. Cervical sprains can range from mild to severe. Severe cervical sprains can cause the neck vertebrae to be unstable. This can lead to damage of the spinal cord and can result in serious nervous system problems. The amount of time it takes for a cervical sprain to get better depends on the cause and extent of the injury. Most cervical sprains heal in 1 to 3 weeks. CAUSES  Severe cervical sprains may be caused by:   Contact sport injuries (such as from football, rugby, wrestling, hockey, auto racing, gymnastics, diving, martial arts, or boxing).   Motor vehicle collisions.   Whiplash injuries. This is an injury from a sudden forward and backward whipping movement of the head and neck.  Falls.  Mild cervical sprains may be caused by:   Being in an awkward position, such as while cradling a telephone between your ear and shoulder.   Sitting in a chair that does not offer proper support.   Working at a poorly Landscape architect station.   Looking up or down for long periods of time.  SYMPTOMS   Pain, soreness, stiffness, or a burning sensation in the front, back, or sides of the neck. This discomfort may develop immediately after the injury or slowly, 24 hours or more after the injury.   Pain or tenderness directly in the middle of the back of the neck.   Shoulder or upper back pain.   Limited ability to move the neck.   Headache.   Dizziness.   Weakness, numbness, or tingling in the hands or arms.   Muscle spasms.   Difficulty swallowing or chewing.   Tenderness and swelling of the neck.  DIAGNOSIS  Most of the time your health care provider  can diagnose a cervical sprain by taking your history and doing a physical exam. Your health care provider will ask about previous neck injuries and any known neck problems, such as arthritis in the neck. X-rays may be taken to find out if there are any other problems, such as with the bones of the neck. Other tests, such as a CT scan or MRI, may also be needed.  TREATMENT  Treatment depends on the severity of the cervical sprain. Mild sprains can be treated with rest, keeping the neck in place (immobilization), and pain medicines. Severe cervical sprains are immediately immobilized. Further treatment is done to help with pain, muscle spasms, and other symptoms and may include:  Medicines, such as pain relievers, numbing medicines, or muscle relaxants.   Physical therapy. This may involve stretching exercises, strengthening exercises, and posture training. Exercises and improved posture can help stabilize the neck, strengthen muscles, and help stop symptoms from returning.  HOME CARE INSTRUCTIONS   Put ice on the injured area.   Put ice in a plastic bag.   Place a towel between your skin and the bag.   Leave the ice on for 15-20 minutes, 3-4 times a day.   If your injury was severe, you may have been given a cervical collar to wear. A cervical collar is a two-piece collar designed to keep your neck from moving while it heals.  Do not remove the collar unless instructed  by your health care provider.  If you have long hair, keep it outside of the collar.  Ask your health care provider before making any adjustments to your collar. Minor adjustments may be required over time to improve comfort and reduce pressure on your chin or on the back of your head.  Ifyou are allowed to remove the collar for cleaning or bathing, follow your health care provider's instructions on how to do so safely.  Keep your collar clean by wiping it with mild soap and water and drying it completely. If the  collar you have been given includes removable pads, remove them every 1-2 days and hand wash them with soap and water. Allow them to air dry. They should be completely dry before you wear them in the collar.  If you are allowed to remove the collar for cleaning and bathing, wash and dry the skin of your neck. Check your skin for irritation or sores. If you see any, tell your health care provider.  Do not drive while wearing the collar.   Only take over-the-counter or prescription medicines for pain, discomfort, or fever as directed by your health care provider.   Keep all follow-up appointments as directed by your health care provider.   Keep all physical therapy appointments as directed by your health care provider.   Make any needed adjustments to your workstation to promote good posture.   Avoid positions and activities that make your symptoms worse.   Warm up and stretch before being active to help prevent problems.  SEEK MEDICAL CARE IF:   Your pain is not controlled with medicine.   You are unable to decrease your pain medicine over time as planned.   Your activity level is not improving as expected.  SEEK IMMEDIATE MEDICAL CARE IF:   You develop any bleeding.  You develop stomach upset.  You have signs of an allergic reaction to your medicine.   Your symptoms get worse.   You develop new, unexplained symptoms.   You have numbness, tingling, weakness, or paralysis in any part of your body.  MAKE SURE YOU:   Understand these instructions.  Will watch your condition.  Will get help right away if you are not doing well or get worse.   This information is not intended to replace advice given to you by your health care provider. Make sure you discuss any questions you have with your health care provider.   Document Released: 01/07/2007 Document Revised: 03/17/2013 Document Reviewed: 09/17/2012 Elsevier Interactive Patient Education 2016 [place] in the Home  Falls can cause injuries and can affect people from all age groups. There are many simple things that you can do to make your home safe and to help prevent falls. WHAT CAN I DO ON THE OUTSIDE OF MY HOME?  Regularly repair the edges of walkways and driveways and fix any cracks.  Remove high doorway thresholds.  Trim any shrubbery on the main path into your home.  Use bright outdoor lighting.  Clear walkways of debris and clutter, including tools and rocks.  Regularly check that handrails are securely fastened and in good repair. Both sides of any steps should have handrails.  Install guardrails along the edges of any raised decks or porches.  Have leaves, snow, and ice cleared regularly.  Use sand or salt on walkways during winter months.  In the garage, clean up any spills right away, including grease or oil spills. WHAT CAN I DO IN  THE BATHROOM?  Use night lights.  Install grab bars by the toilet and in the tub and shower. Do not use towel bars as grab bars.  Use non-skid mats or decals on the floor of the tub or shower.  If you need to sit down while you are in the shower, use a plastic, non-slip stool.[name]  Keep the floor dry. Immediately clean up any water that spills on the floor.  Remove soap buildup in the tub or shower on a regular basis.  Attach bath mats securely with double-sided non-slip rug tape.  Remove throw rugs and other tripping hazards from the floor. WHAT CAN I DO IN THE BEDROOM?  Use night lights.  Make sure that a bedside light is easy to reach.  Do not use oversized bedding that drapes onto the floor.  Have a firm chair that has side arms to use for getting dressed.  Remove throw rugs and other tripping hazards from the floor. WHAT CAN I DO IN THE KITCHEN?   Clean up any spills right away.  Avoid walking on wet floors.  Place frequently used items in easy-to-reach places.  If you need to reach for  something above you, use a sturdy step stool that has a grab bar.  Keep electrical cables out of the way.  Do not use floor polish or wax that makes floors slippery. If you have to use wax, make sure that it is non-skid floor wax.  Remove throw rugs and other tripping hazards from the floor. WHAT CAN I DO IN THE STAIRWAYS?  Do not leave any items on the stairs.  Make sure that there are handrails on both sides of the stairs. Fix handrails that are broken or loose. Make sure that handrails are as long as the stairways.  Check any carpeting to make sure that it is firmly attached to the stairs. Fix any carpet that is loose or worn.  Avoid having throw rugs at the top or bottom of stairways, or secure the rugs with carpet tape to prevent them from moving.  Make sure that you have a light switch at the top of the stairs and the bottom of the stairs. If you do not have them, have them installed. WHAT ARE SOME OTHER FALL PREVENTION TIPS?  Wear closed-toe shoes that fit well and support your feet. Wear shoes that have rubber soles or low heels.  When you use a stepladder, make sure that it is completely opened and that the sides are firmly locked. Have someone hold the ladder while you are using it. Do not climb a closed stepladder.  Add color or contrast paint or tape to grab bars and handrails in your home. Place contrasting color strips on the first and last steps.  Use mobility aids as needed, such as canes, walkers, scooters, and crutches.  Turn on lights if it is dark. Replace any light bulbs that burn out.  Set up furniture so that there are clear paths. Keep the furniture in the same spot.  Fix any uneven floor surfaces.  Choose a carpet design that does not hide the edge of steps of a stairway.  Be aware of any and all pets.  Review your medicines with your healthcare provider. Some medicines can cause dizziness or changes in blood pressure, which increase your risk of  falling. Talk with your health care provider about other ways that you can decrease your risk of falls. This may include working with a physical therapist or trainer  to improve your strength, balance, and endurance.   This information is not intended to replace advice given to you by your health care provider. Make sure you discuss any questions you have with your health care provider.   Document Released: 03/02/2002 Document Revised: 07/27/2014 Document Reviewed: 04/16/2014 Elsevier Interactive Patient Education [employer].

## 2015-09-14 NOTE — ED Provider Notes (Signed)
CSN: [id number]     Arrival date & time 09/14/15  1312 History  By signing my name below, I, Reola Mosher, attest that this documentation has been prepared under the direction and in the presence of Add Dinapoli, PA-C.   Electronically Signed: Reola Mosher, ED Scribe. 09/14/2015. 2:16 PM.   Chief Complaint  Patient presents with  . Fall   The history is provided by the patient. No language interpreter was used.    HPI Comments: Thomas Sosa is a 60 y.o. male with a PMHx of HTN, DM, diabetic neuropathy, prostate cancer, HLD, and CAD who presents to the Emergency Department complaining of sudden onset, gradually worsening, constant, aching, 8/10, non-radiating bilateral neck pain s/p mechanical fall that occurred 4 days PTA. He has associated neck stiffness which has resolved slightly since the incident, an intermittent headache, right shoulder, and right elbow pain. Pt states that he was walking when he fell forward and hit the left side of his neck on the side of a refrigerator and continued to fall to the ground. Pt denies LOC during the accident. His pain is aggravated upon moving his neck, or moving his right arm. He has tried using heat and ice to alleviate his pain with no relief. He has not taken any new medications to try to alleviate his pain.  He states that he saw no obvious deformity or bruising after the accident. Pt has been able to ambulate normally, and sometimes uses the assistance of a cane which he reports is normal for him. Pt is currently taking anticoagulants. He denies bilateral upper extremity weakness.    Past Medical History  Diagnosis Date  . Hypertension   . Diabetes mellitus without complication ([place])   . Diabetic neuropathy ([place]) Dx 2011  . Cancer of prostate ([place]) Dx 2005  . Hyperlipidemia   . Coronary artery disease   . Tobacco abuse   . Peripheral arterial disease ([place])   . Stroke ([place])   . Hemiparesis and alteration of sensations as late  effects of stroke ([place]) 02/28/2015   Past Surgical History  Procedure Laterality Date  . Replacement total knee  Lt 2013/ Rt 2005  . Ep implantable device N/A 10/13/2014    Procedure: ICD Implant;  Surgeon: Deboraha Sprang, MD;  Location: [facility];  Service: Cardiovascular;  Laterality: N/A;  . Tee without cardioversion N/A 10/11/2014    Procedure: TRANSESOPHAGEAL ECHOCARDIOGRAM (TEE);  Surgeon: Dorothy Spark, MD;  Location: [facility] ENDOSCOPY;  Service: Cardiovascular;  Laterality: N/A;   Family History  Problem Relation Age of Onset  . Heart Problems Mother   . Heart Problems Father   . Heart attack Brother    Social History  Substance Use Topics  . Smoking status: Former Smoker    Quit date: 03/26/2014  . Smokeless tobacco: Never Used  . Alcohol Use: No    Review of Systems  Musculoskeletal: Positive for myalgias, neck pain and neck stiffness.       +R shoulder pain, R elbow pain  Neurological: Negative for weakness.   Allergies  Lisinopril  Home Medications   Prior to Admission medications   Medication Sig Start Date End Date Taking? Authorizing Provider  amitriptyline (ELAVIL) 50 MG tablet Take 50 mg by mouth at bedtime.    Historical Provider, MD  aspirin 81 MG EC tablet Take 1 tablet (81 mg total) by mouth daily. 02/10/15   [name], NP  atorvastatin (LIPITOR) 80 MG tablet Take 1  tablet (80 mg total) by mouth daily. 04/27/15   [name], NP  carvedilol (COREG) 12.5 MG tablet Take 1 tablet (12.5 mg total) by mouth 2 (two) times daily. 05/31/15   Deboraha Sprang, MD  gabapentin (NEURONTIN) 600 MG tablet Take 1.5 tablets (900 mg total) by mouth 3 (three) times daily. Must have office visit for refills 07/12/15   Tresa Garter, MD  glipiZIDE (GLUCOTROL XL) 5 MG 24 hr tablet Take 1 tablet (5 mg total) by mouth daily with breakfast. 07/12/15   Tresa Garter, MD  hydrALAZINE (APRESOLINE) 25 MG tablet Take 1 tablet (25 mg total) by mouth 2 (two) times  daily. 06/15/15   Erlene Quan, PA-C  HYDROcodone-acetaminophen (NORCO/VICODIN) 5-325 MG tablet Take 1-2 tablets by mouth every 6 (six) hours as needed for moderate pain. 01/16/15   Fredia Sorrow, MD  insulin aspart (NOVOLOG) 100 UNIT/ML injection Inject 10 Units into the skin 3 (three) times daily before meals. < 400 = 14 units; > 400 = call MD 10/14/14   Belkys A Regalado, MD  insulin detemir (LEVEMIR) 100 UNIT/ML injection Inject 0.35 mLs (35 Units total) into the skin 2 (two) times daily. 02/14/15   [name], NP  isosorbide dinitrate (ISORDIL) 20 MG tablet Take 1 tablet (20 mg total) by mouth 2 (two) times daily. 06/15/15   Erlene Quan, PA-C  omeprazole (PRILOSEC) 20 MG capsule Take 1 capsule (20 mg total) by mouth daily. Acid reflux 04/27/15   [name], NP  ONE TOUCH ULTRA TEST test strip TEST four times a day 06/07/14   [name], NP  oxyCODONE-acetaminophen (PERCOCET/ROXICET) 5-325 MG tablet Take 1 tablet by mouth every 8 (eight) hours as needed. 01/06/15   Historical Provider, MD  penicillin v potassium (VEETID) 500 MG tablet Take 1 tablet (500 mg total) by mouth 4 (four) times daily. 01/16/15   Fredia Sorrow, MD  polyethylene glycol (MIRALAX / GLYCOLAX) packet Take 17 g by mouth 2 (two) times daily. 04/27/15   [name], NP  traMADol (ULTRAM) 50 MG tablet Take 2 tablets (100 mg total) by mouth every 6 (six) hours as needed for moderate pain. 10/14/14   Belkys A Regalado, MD  warfarin (COUMADIN) 5 MG tablet Take 2 tablets (10 mg) everyday except Sunday take 1.5 tablets (7.5 mg) 07/12/15   Olugbemiga E Jegede, MD   BP 162/102 mmHg  Pulse 91  Temp(Src) 98 F (36.7 C) (Oral)  Resp 16  Ht 6' (1.829 m)  Wt 270 lb (122.471 kg)  BMI 36.61 kg/m2  SpO2 97%   Physical Exam  Constitutional: He appears well-developed and well-nourished.  HENT:  Head: Normocephalic.  Eyes: Conjunctivae are normal.  Neck: Neck supple.  Midline and bilateral paravertebral cervical spine  tenderness. Full range of motion of the neck. Pain with range of motion all directions. Strength is intact against resistance with the neck motion in all directions.  Cardiovascular: Normal rate.   Pulmonary/Chest: Effort normal. No respiratory distress.  Abdominal: He exhibits no distension.  Musculoskeletal: Normal range of motion.  No midline thoracic or lumbar spine tenderness. Tender to palpation over right shoulder, diffusely. Full range of motion, pain with range of motion of right shoulder. Tenderness to palpation over medial and lateral right elbow joint. Pain with range of motion of the elbow joint. Normal wrist and hand.   Neurological: He is alert.  5/5 and equal lower and upper extremity strength. Grip strength is 5 out of  5 and equal bilaterally 2+ and equal patellar reflexes bilaterally. Pt able to dorsiflex bilateral toes and feet with good strength against resistance. Equal sensation bilaterally over thighs and lower legs.   Skin: Skin is warm and dry.  Psychiatric: He has a normal mood and affect. His behavior is normal.  Nursing note and vitals reviewed.  ED Course  Procedures (including critical care time) DIAGNOSTIC STUDIES: Oxygen Saturation is 97% on RA, normal by my interpretation.   COORDINATION OF CARE: 2:06 PM-Discussed next steps with pt including DG R shoulder, DG R elbow, and DG C-spine. Pt verbalized understanding and is agreeable with the plan.   Imaging Review Dg Cervical Spine Complete  09/14/2015  CLINICAL DATA:  [place] 4 days ago, neck pain EXAM: CERVICAL SPINE - COMPLETE 4+ VIEW COMPARISON:  None; correlation CT angio neck 10/11/2014 FINDINGS: Prevertebral soft tissues normal thickness. Osseous mineralization normal. Disc space narrowing with endplate spur formation C2-C3 through C5-C6. Vertebral body heights maintained without fracture or bone destruction. Minimal retrolisthesis C3-C4 and C4-C5, grossly stable. Encroachment upon cervical neural foramina at  multiple levels bilaterally greatest RIGHT C3-C4 and LEFT C5-C6. Atherosclerotic calcifications of the carotid systems bilaterally. C1-C2 alignment normal. 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 BILATERAL carotid arterial calcifications. Electronically Signed   By: Lavonia Dana M.D.   On: 09/14/2015 14:44   Dg Shoulder Right  09/14/2015  CLINICAL DATA:  [place] 4 days ago, RIGHT shoulder pain EXAM: RIGHT SHOULDER - 2+ VIEW COMPARISON:  None FINDINGS: Osseous mineralization normal. AC joint alignment normal. No acute fracture, dislocation or bone destruction. Visualized ribs unremarkable. Pacemaker leads and prior median sternotomy noted. IMPRESSION: Normal exam. Electronically Signed   By: Lavonia Dana M.D.   On: 09/14/2015 14:50   Dg Elbow Complete Right  09/14/2015  CLINICAL DATA:  [place] 4 days ago, RIGHT elbow pain EXAM: RIGHT ELBOW - COMPLETE 3+ VIEW COMPARISON:  None FINDINGS: Osseous mineralization normal. Joint space narrowing RIGHT elbow predominately epitrochlear joint. No acute fracture, dislocation or bone destruction. Small amount of chondrocalcinosis present. No definite joint effusion. IMPRESSION: Degenerative changes and minimal chondrocalcinosis question CPPD. No acute fracture dislocation. Electronically Signed   By: Lavonia Dana M.D.   On: 09/14/2015 14:51   I have personally reviewed and evaluated these images as part of my medical decision-making.   MDM   Final diagnoses:  Fall, initial encounter  Cervical strain, initial encounter  Elbow sprain, right, initial encounter  Shoulder sprain, right, initial encounter   Patient emergency department after a fall 4 days ago. He continues to have some neck pain and right shoulder and right elbow pain. X-rays obtained and are all negative. He is neurovascularly intact. I given Ace wrap for his right elbow. He actually states that his neck pain is improving,  however his friend made him come to the emergency department to get checked out. I will give a prescription for Robaxin, to help him sleep at night since he states that is the most painful. I will have him follow-up with his primary care doctor for recheck especially if pain is not improving. Return precautions discussed.  Blood pressure elevated emergency department, patient states "my blood pressure is always elevated." Instructed to make sure he takes his medications and follow-up for recheck with primary care doctor.  Filed Vitals:   09/14/15 1322 09/14/15 1324 09/14/15 1519  BP:  162/102 162/108  Pulse: 91  77  Temp: 98 F (36.7  C)  99 F (37.2 C)  TempSrc: Oral  Oral  Resp: 16  17  Height: 6' (1.829 m)    Weight: 122.471 kg    SpO2: 97%  99%    I personally performed the services described in this documentation, which was scribed in my presence. The recorded information has been reviewed and is accurate.   Jeannett Senior, PA-C 09/14/15 1647  Tanna Furry, MD 09/21/15 [phone]

## 2015-09-16 ENCOUNTER — Encounter (HOSPITAL_COMMUNITY): Payer: Self-pay | Admitting: Emergency Medicine

## 2015-09-16 ENCOUNTER — Emergency Department (HOSPITAL_COMMUNITY): Payer: Medicare HMO

## 2015-09-16 ENCOUNTER — Inpatient Hospital Stay (HOSPITAL_COMMUNITY)
Admission: EM | Admit: 2015-09-16 | Discharge: 2015-09-24 | DRG: 380 | Disposition: A | Discharge status: Home / Self Care | Payer: Medicare HMO | Attending: Internal Medicine | Admitting: Internal Medicine

## 2015-09-16 ENCOUNTER — Other Ambulatory Visit: Payer: Self-pay

## 2015-09-16 DIAGNOSIS — E119 Type 2 diabetes mellitus without complications: Secondary | ICD-10-CM

## 2015-09-16 DIAGNOSIS — D62 Acute posthemorrhagic anemia: Secondary | ICD-10-CM | POA: Diagnosis present

## 2015-09-16 DIAGNOSIS — I255 Ischemic cardiomyopathy: Secondary | ICD-10-CM | POA: Diagnosis not present

## 2015-09-16 DIAGNOSIS — K922 Gastrointestinal hemorrhage, unspecified: Secondary | ICD-10-CM | POA: Diagnosis not present

## 2015-09-16 DIAGNOSIS — R531 Weakness: Secondary | ICD-10-CM | POA: Diagnosis present

## 2015-09-16 DIAGNOSIS — K2211 Ulcer of esophagus with bleeding: Secondary | ICD-10-CM | POA: Diagnosis not present

## 2015-09-16 DIAGNOSIS — E785 Hyperlipidemia, unspecified: Secondary | ICD-10-CM | POA: Diagnosis present

## 2015-09-16 DIAGNOSIS — R112 Nausea with vomiting, unspecified: Secondary | ICD-10-CM

## 2015-09-16 DIAGNOSIS — E860 Dehydration: Secondary | ICD-10-CM | POA: Diagnosis not present

## 2015-09-16 DIAGNOSIS — Z794 Long term (current) use of insulin: Secondary | ICD-10-CM

## 2015-09-16 DIAGNOSIS — G8194 Hemiplegia, unspecified affecting left nondominant side: Secondary | ICD-10-CM | POA: Diagnosis not present

## 2015-09-16 DIAGNOSIS — Z95810 Presence of automatic (implantable) cardiac defibrillator: Secondary | ICD-10-CM | POA: Diagnosis not present

## 2015-09-16 DIAGNOSIS — I159 Secondary hypertension, unspecified: Secondary | ICD-10-CM | POA: Insufficient documentation

## 2015-09-16 DIAGNOSIS — E1142 Type 2 diabetes mellitus with diabetic polyneuropathy: Secondary | ICD-10-CM

## 2015-09-16 DIAGNOSIS — I2510 Atherosclerotic heart disease of native coronary artery without angina pectoris: Secondary | ICD-10-CM | POA: Diagnosis present

## 2015-09-16 DIAGNOSIS — R29702 NIHSS score 2: Secondary | ICD-10-CM | POA: Diagnosis not present

## 2015-09-16 DIAGNOSIS — Z7901 Long term (current) use of anticoagulants: Secondary | ICD-10-CM

## 2015-09-16 DIAGNOSIS — K210 Gastro-esophageal reflux disease with esophagitis: Secondary | ICD-10-CM | POA: Diagnosis present

## 2015-09-16 DIAGNOSIS — Z951 Presence of aortocoronary bypass graft: Secondary | ICD-10-CM

## 2015-09-16 DIAGNOSIS — E876 Hypokalemia: Secondary | ICD-10-CM | POA: Diagnosis not present

## 2015-09-16 DIAGNOSIS — R1110 Vomiting, unspecified: Secondary | ICD-10-CM | POA: Diagnosis present

## 2015-09-16 DIAGNOSIS — I5022 Chronic systolic (congestive) heart failure: Secondary | ICD-10-CM | POA: Diagnosis present

## 2015-09-16 DIAGNOSIS — G9340 Encephalopathy, unspecified: Secondary | ICD-10-CM

## 2015-09-16 DIAGNOSIS — E118 Type 2 diabetes mellitus with unspecified complications: Secondary | ICD-10-CM | POA: Diagnosis not present

## 2015-09-16 DIAGNOSIS — Z9114 Patient's other noncompliance with medication regimen: Secondary | ICD-10-CM

## 2015-09-16 DIAGNOSIS — K920 Hematemesis: Secondary | ICD-10-CM

## 2015-09-16 DIAGNOSIS — E1151 Type 2 diabetes mellitus with diabetic peripheral angiopathy without gangrene: Secondary | ICD-10-CM | POA: Diagnosis present

## 2015-09-16 DIAGNOSIS — Z87891 Personal history of nicotine dependence: Secondary | ICD-10-CM

## 2015-09-16 DIAGNOSIS — Z96659 Presence of unspecified artificial knee joint: Secondary | ICD-10-CM | POA: Diagnosis present

## 2015-09-16 DIAGNOSIS — I639 Cerebral infarction, unspecified: Secondary | ICD-10-CM | POA: Clinically undetermined

## 2015-09-16 DIAGNOSIS — R739 Hyperglycemia, unspecified: Secondary | ICD-10-CM

## 2015-09-16 DIAGNOSIS — I472 Ventricular tachycardia: Secondary | ICD-10-CM | POA: Diagnosis not present

## 2015-09-16 DIAGNOSIS — E1165 Type 2 diabetes mellitus with hyperglycemia: Secondary | ICD-10-CM | POA: Diagnosis present

## 2015-09-16 DIAGNOSIS — Z8546 Personal history of malignant neoplasm of prostate: Secondary | ICD-10-CM

## 2015-09-16 DIAGNOSIS — Z7982 Long term (current) use of aspirin: Secondary | ICD-10-CM

## 2015-09-16 LAB — COMPREHENSIVE METABOLIC PANEL
ALBUMIN: 3.4 g/dL — AB (ref 3.5–5.0)
ALK PHOS: 80 U/L (ref 38–126)
ALT: 11 U/L — AB (ref 17–63)
AST: 13 U/L — AB (ref 15–41)
Anion gap: 14 (ref 5–15)
BILIRUBIN TOTAL: 1.3 mg/dL — AB (ref 0.3–1.2)
BUN: 17 mg/dL (ref 6–20)
CALCIUM: 9 mg/dL (ref 8.9–10.3)
CO2: 30 mmol/L (ref 22–32)
CREATININE: 0.88 mg/dL (ref 0.61–1.24)
Chloride: 96 mmol/L — ABNORMAL LOW (ref 101–111)
GFR calc Af Amer: >60 mL/min (ref >60)
GFR calc non Af Amer: >60 mL/min (ref >60)
GLUCOSE: 277 mg/dL — AB (ref 65–99)
Potassium: 3.5 mmol/L (ref 3.5–5.1)
SODIUM: 140 mmol/L (ref 135–145)
TOTAL PROTEIN: 8.2 g/dL — AB (ref 6.5–8.1)

## 2015-09-16 LAB — CBC
HEMATOCRIT: 44.9 % (ref 39.0–52.0)
HEMOGLOBIN: 15.8 g/dL (ref 13.0–17.0)
MCH: 30.6 pg (ref 26.0–34.0)
MCHC: 35.2 g/dL (ref 30.0–36.0)
MCV: 87 fL (ref 78.0–100.0)
Platelets: 270 10*3/uL (ref 150–400)
RBC: 5.16 MIL/uL (ref 4.22–5.81)
RDW: 12.9 % (ref 11.5–15.5)
WBC: 7 10*3/uL (ref 4.0–10.5)

## 2015-09-16 LAB — URINE MICROSCOPIC-ADD ON
BACTERIA UA: NONE SEEN
WBC, UA: NONE SEEN WBC/hpf (ref 0–5)

## 2015-09-16 LAB — URINALYSIS, ROUTINE W REFLEX MICROSCOPIC
Ketones, ur: >80 mg/dL — AB
Leukocytes, UA: NEGATIVE
NITRITE: NEGATIVE
Protein, ur: >300 mg/dL — AB
SPECIFIC GRAVITY, URINE: 1.041 — AB (ref 1.005–1.030)
pH: 5.5 (ref 5.0–8.0)

## 2015-09-16 LAB — PROTIME-INR
INR: 1.11 (ref 0.00–1.49)
Prothrombin Time: 14.5 seconds (ref 11.6–15.2)

## 2015-09-16 LAB — CBG MONITORING, ED: Glucose-Capillary: 287 mg/dL — ABNORMAL HIGH (ref 65–99)

## 2015-09-16 LAB — LIPASE, BLOOD: Lipase: 17 U/L (ref 11–51)

## 2015-09-16 LAB — I-STAT TROPONIN, ED: Troponin i, poc: 0.02 ng/mL (ref 0.00–0.08)

## 2015-09-16 MED ORDER — HYDRALAZINE HCL 20 MG/ML IJ SOLN
20.0000 mg | Freq: Once | INTRAMUSCULAR | Status: AC
  Administered 2015-09-16: 20 mg via INTRAVENOUS
  Filled 2015-09-16: qty 1

## 2015-09-16 MED ORDER — SODIUM CHLORIDE 0.9 % IV SOLN
80.0000 mg | INTRAVENOUS | Status: AC
  Administered 2015-09-16: 80 mg via INTRAVENOUS
  Filled 2015-09-16: qty 80

## 2015-09-16 MED ORDER — MORPHINE SULFATE (PF) 2 MG/ML IV SOLN
2.0000 mg | INTRAVENOUS | Status: DC | PRN
  Administered 2015-09-17 – 2015-09-19 (×4): 2 mg via INTRAVENOUS
  Filled 2015-09-16 (×4): qty 1

## 2015-09-16 MED ORDER — ALBUTEROL SULFATE (2.5 MG/3ML) 0.083% IN NEBU: 2.5000 mg | INHALATION_SOLUTION | RESPIRATORY_TRACT | Status: DC | PRN

## 2015-09-16 MED ORDER — ONDANSETRON HCL 4 MG/2ML IJ SOLN
4.0000 mg | Freq: Once | INTRAMUSCULAR | Status: AC
  Administered 2015-09-16: 4 mg via INTRAVENOUS
  Filled 2015-09-16: qty 2

## 2015-09-16 MED ORDER — SODIUM CHLORIDE 0.9 % IV SOLN
8.0000 mg/h | INTRAVENOUS | Status: DC
  Administered 2015-09-16 – 2015-09-18 (×4): 8 mg/h via INTRAVENOUS
  Filled 2015-09-16 (×8): qty 80

## 2015-09-16 MED ORDER — ONDANSETRON HCL 4 MG PO TABS: 4.0000 mg | ORAL_TABLET | Freq: Four times a day (QID) | ORAL | Status: DC | PRN

## 2015-09-16 MED ORDER — ONDANSETRON HCL 4 MG/2ML IJ SOLN
4.0000 mg | Freq: Four times a day (QID) | INTRAMUSCULAR | Status: DC | PRN
  Administered 2015-09-22: 4 mg via INTRAVENOUS
  Filled 2015-09-16: qty 2

## 2015-09-16 MED ORDER — SODIUM CHLORIDE 0.9 % IV SOLN: Freq: Once | INTRAVENOUS | Status: DC

## 2015-09-16 MED ORDER — SODIUM CHLORIDE 0.9 % IV SOLN
INTRAVENOUS | Status: AC
  Administered 2015-09-17: 01:00:00 via INTRAVENOUS

## 2015-09-16 MED ORDER — MORPHINE SULFATE (PF) 4 MG/ML IV SOLN
4.0000 mg | Freq: Once | INTRAVENOUS | Status: AC
  Administered 2015-09-16: 4 mg via INTRAVENOUS
  Filled 2015-09-16: qty 1

## 2015-09-16 MED ORDER — HYDRALAZINE HCL 20 MG/ML IJ SOLN
10.0000 mg | INTRAMUSCULAR | Status: DC | PRN
  Administered 2015-09-17: 10 mg via INTRAVENOUS
  Filled 2015-09-16: qty 1

## 2015-09-16 MED ORDER — SODIUM CHLORIDE 0.9 % IV BOLUS (SEPSIS)
500.0000 mL | Freq: Once | INTRAVENOUS | Status: AC
  Administered 2015-09-16: 500 mL via INTRAVENOUS

## 2015-09-16 MED ORDER — SODIUM CHLORIDE 0.9% FLUSH
3.0000 mL | Freq: Two times a day (BID) | INTRAVENOUS | Status: DC
  Administered 2015-09-18 – 2015-09-22 (×2): 3 mL via INTRAVENOUS

## 2015-09-16 MED ORDER — SODIUM CHLORIDE 0.9 % IV BOLUS (SEPSIS)
1000.0000 mL | Freq: Once | INTRAVENOUS | Status: AC
  Administered 2015-09-17: 1000 mL via INTRAVENOUS

## 2015-09-16 MED ORDER — INSULIN ASPART 100 UNIT/ML ~~LOC~~ SOLN
0.0000 [IU] | SUBCUTANEOUS | Status: DC
  Administered 2015-09-17: 5 [IU] via SUBCUTANEOUS
  Administered 2015-09-17: 3 [IU] via SUBCUTANEOUS
  Administered 2015-09-17 – 2015-09-18 (×6): 2 [IU] via SUBCUTANEOUS
  Administered 2015-09-18 (×2): 3 [IU] via SUBCUTANEOUS
  Administered 2015-09-18 – 2015-09-19 (×4): 2 [IU] via SUBCUTANEOUS
  Administered 2015-09-19 (×2): 5 [IU] via SUBCUTANEOUS
  Administered 2015-09-19: 2 [IU] via SUBCUTANEOUS
  Administered 2015-09-19: 5 [IU] via SUBCUTANEOUS
  Administered 2015-09-20: 2 [IU] via SUBCUTANEOUS
  Administered 2015-09-20 (×3): 3 [IU] via SUBCUTANEOUS
  Administered 2015-09-20: 2 [IU] via SUBCUTANEOUS
  Administered 2015-09-20 – 2015-09-21 (×2): 5 [IU] via SUBCUTANEOUS
  Administered 2015-09-21: 7 [IU] via SUBCUTANEOUS

## 2015-09-16 MED ORDER — IOPAMIDOL (ISOVUE-300) INJECTION 61%
100.0000 mL | Freq: Once | INTRAVENOUS | Status: AC | PRN
  Administered 2015-09-16: 100 mL via INTRAVENOUS

## 2015-09-16 NOTE — ED Provider Notes (Signed)
CSN: [id number]     Arrival date & time 09/16/15  1809 History   First MD Initiated Contact with Patient 09/16/15 1814     Chief Complaint  Patient presents with  . Abdominal Pain     (Consider location/radiation/quality/duration/timing/severity/associated sxs/prior Treatment) HPI   Thomas Sosa is a 60 y.o M with a pmhx of DM, HLD, CAD, CVA on coumadin, PAD who presents to the ED today c/o N/V. Pt states that for the last 3 days he has had significant N/V and has been vomiting up black contents that look like coffee ground. Pt has not been able to eat or drink anything in 3 days. He as associated chills and diffuse abdominal tenderness, but this is increased in the epigastrium. Pt also reports burning in his chest with vomiting that he states feels similar to his reflux. He has not had a BM in 3 days. Pt feels fatigued and weak in his bilateral LE. Pt states that he has been out of his coumadin, BP medication and insulin for 1 month. He denies SOB, fevers, diarrhea, chest pain, LOC or change in vision.   Past Medical History  Diagnosis Date  . Hypertension   . Diabetes mellitus without complication ([place])   . Diabetic neuropathy ([place]) Dx 2011  . Cancer of prostate ([place]) Dx 2005  . Hyperlipidemia   . Coronary artery disease   . Tobacco abuse   . Peripheral arterial disease ([place])   . Stroke ([place])   . Hemiparesis and alteration of sensations as late effects of stroke ([place]) 02/28/2015   Past Surgical History  Procedure Laterality Date  . Replacement total knee  Lt 2013/ Rt 2005  . Ep implantable device N/A 10/13/2014    Procedure: ICD Implant;  Surgeon: Deboraha Sprang, MD;  Location: [facility];  Service: Cardiovascular;  Laterality: N/A;  . Tee without cardioversion N/A 10/11/2014    Procedure: TRANSESOPHAGEAL ECHOCARDIOGRAM (TEE);  Surgeon: Dorothy Spark, MD;  Location: [facility] ENDOSCOPY;  Service: Cardiovascular;  Laterality: N/A;   Family History  Problem Relation Age of Onset   . Heart Problems Mother   . Heart Problems Father   . Heart attack Brother    Social History  Substance Use Topics  . Smoking status: Former Smoker    Quit date: 03/26/2014  . Smokeless tobacco: Never Used  . Alcohol Use: No    Review of Systems  All other systems reviewed and are negative.     Allergies  Lisinopril  Home Medications   Prior to Admission medications   Medication Sig Start Date End Date Taking? Authorizing Provider  amitriptyline (ELAVIL) 50 MG tablet Take 50 mg by mouth at bedtime.   Yes Historical Provider, MD  aspirin 81 MG EC tablet Take 1 tablet (81 mg total) by mouth daily. 02/10/15  Yes [name], NP  atorvastatin (LIPITOR) 80 MG tablet Take 1 tablet (80 mg total) by mouth daily. 04/27/15  Yes [name], NP  carvedilol (COREG) 12.5 MG tablet Take 1 tablet (12.5 mg total) by mouth 2 (two) times daily. 05/31/15  Yes Deboraha Sprang, MD  gabapentin (NEURONTIN) 600 MG tablet Take 1.5 tablets (900 mg total) by mouth 3 (three) times daily. Must have office visit for refills 07/12/15  Yes Tresa Garter, MD  glipiZIDE (GLUCOTROL XL) 5 MG 24 hr tablet Take 1 tablet (5 mg total) by mouth daily with breakfast. 07/12/15  Yes Tresa Garter, MD  hydrALAZINE (APRESOLINE) 25 MG  tablet Take 1 tablet (25 mg total) by mouth 2 (two) times daily. 06/15/15  Yes Luke K Kilroy, PA-C  insulin detemir (LEVEMIR) 100 UNIT/ML injection Inject 0.35 mLs (35 Units total) into the skin 2 (two) times daily. 02/14/15  Yes [name], NP  isosorbide dinitrate (ISORDIL) 20 MG tablet Take 1 tablet (20 mg total) by mouth 2 (two) times daily. 06/15/15  Yes Luke K Kilroy, PA-C  omeprazole (PRILOSEC) 20 MG capsule Take 1 capsule (20 mg total) by mouth daily. Acid reflux 04/27/15  Yes [name], NP  polyethylene glycol (MIRALAX / GLYCOLAX) packet Take 17 g by mouth 2 (two) times daily. 04/27/15  Yes [name], NP  warfarin (COUMADIN) 5 MG tablet Take 2 tablets (10 mg)  everyday except Sunday take 1.5 tablets (7.5 mg) Patient taking differently: Take 7.5-15 mg by mouth daily. Take 2 tablets (10 mg) everyday except Take 3 tablets (15 mg) on Tues and Take 1.5 tablets (7.5 mg) on Sunday 07/12/15  Yes Olugbemiga E Doreene Burke, MD  HYDROcodone-acetaminophen (NORCO/VICODIN) 5-325 MG tablet Take 1-2 tablets by mouth every 6 (six) hours as needed for moderate pain. Patient not taking: Reported on 09/16/2015 01/16/15   Fredia Sorrow, MD  insulin aspart (NOVOLOG) 100 UNIT/ML injection Inject 10 Units into the skin 3 (three) times daily before meals. < 400 = 14 units; > 400 = call MD 10/14/14   Elmarie Shiley, MD  losartan (COZAAR) 50 MG tablet Take 50 mg by mouth daily.  09/15/15   Historical Provider, MD  methocarbamol (ROBAXIN) 500 MG tablet Take 1 tablet (500 mg total) by mouth 2 (two) times daily. 09/14/15   Tatyana Kirichenko, PA-C  ONE TOUCH ULTRA TEST test strip TEST four times a day 06/07/14   [name], NP  oxyCODONE-acetaminophen (PERCOCET/ROXICET) 5-325 MG tablet Take 1 tablet by mouth every 8 (eight) hours as needed for moderate pain or severe pain.  01/06/15   Historical Provider, MD  penicillin v potassium (VEETID) 500 MG tablet Take 1 tablet (500 mg total) by mouth 4 (four) times daily. Patient not taking: Reported on 09/16/2015 01/16/15   Fredia Sorrow, MD  traMADol (ULTRAM) 50 MG tablet Take 2 tablets (100 mg total) by mouth every 6 (six) hours as needed for moderate pain. 10/14/14   Belkys A Regalado, MD   BP 202/127 mmHg  Pulse 96  Temp(Src) 98.5 F (36.9 C) (Oral)  Resp 18  Ht 6' (1.829 m)  Wt 106.595 kg  BMI 31.86 kg/m2  SpO2 96% Physical Exam  Constitutional: He is oriented to person, place, and time. He appears well-developed and well-nourished. No distress.  Pt actively vomiting black emesis   HENT:  Head: Normocephalic and atraumatic.  Mouth/Throat: No oropharyngeal exudate.  Eyes: Conjunctivae and EOM are normal. Pupils are equal, round,  and reactive to light. Right eye exhibits no discharge. Left eye exhibits no discharge. No scleral icterus.  Cardiovascular: Normal rate, regular rhythm, normal heart sounds and intact distal pulses.  Exam reveals no gallop and no friction rub.   No murmur heard. Pulmonary/Chest: Effort normal and breath sounds normal. No respiratory distress. He has no wheezes. He has no rales. He exhibits no tenderness.  Abdominal: Soft. He exhibits no distension. There is tenderness ( epigastric and LUQ). There is no guarding.  Musculoskeletal: Normal range of motion. He exhibits no edema.  Neurological: He is alert and oriented to person, place, and time.  Strength 5/5 throughout. No sensory deficits.  Skin: Skin is warm and dry. No rash noted. He is not diaphoretic. No erythema. No pallor.  Psychiatric: He has a normal mood and affect. His behavior is normal.  Nursing note and vitals reviewed.   ED Course  Procedures (including critical care time) Labs Review Labs Reviewed  URINALYSIS, ROUTINE W REFLEX MICROSCOPIC (NOT AT [facility]) - Abnormal; Notable for the following:    Specific Gravity, Urine 1.041 (*)    Glucose, UA >1000 (*)    Hgb urine dipstick SMALL (*)    Bilirubin Urine SMALL (*)    Ketones, ur >80 (*)    Protein, ur >300 (*)    All other components within normal limits  COMPREHENSIVE METABOLIC PANEL - Abnormal; Notable for the following:    Chloride 96 (*)    Glucose, Bld 277 (*)    Total Protein 8.2 (*)    Albumin 3.4 (*)    AST 13 (*)    ALT 11 (*)    Total Bilirubin 1.3 (*)    All other components within normal limits  URINE MICROSCOPIC-ADD ON - Abnormal; Notable for the following:    Squamous Epithelial / LPF 0-5 (*)    Casts HYALINE CASTS (*)    All other components within normal limits  CBG MONITORING, ED - Abnormal; Notable for the following:    Glucose-Capillary 287 (*)    All other components within normal limits  CBC  LIPASE, BLOOD  PROTIME-INR  I-STAT TROPOININ,  ED  POCT GASTRIC OCCULT BLOOD (1-CARD TO LAB)    Imaging Review Dg Abd 1 View  09/16/2015  CLINICAL DATA:  Acute onset of coffee-ground emesis and mid abdominal pain. Initial encounter. EXAM: ABDOMEN - 1 VIEW COMPARISON:  None. FINDINGS: The visualized bowel gas pattern is unremarkable. Scattered air and stool filled loops of colon are seen; no abnormal dilatation of small bowel loops is seen to suggest small bowel obstruction. No free intra-abdominal air is identified, though evaluation for free air is limited on a single supine view. The visualized osseous structures are within normal limits; the sacroiliac joints are unremarkable in appearance. Brachytherapy seeds are partially imaged at the prostate bed. IMPRESSION: Unremarkable bowel gas pattern; no free intra-abdominal air seen. Electronically Signed   By: Garald Balding M.D.   On: 09/16/2015 19:42   I have personally reviewed and evaluated these images and lab results as part of my medical decision-making.   EKG Interpretation None      MDM   Final diagnoses:  Hematemesis with nausea    60 year old male with past medical history of CVA on Coumadin, DM presents the ED with a 3 day history of cyclical nausea and vomiting black emesis. Patient also has associated epigastric and left upper quadrant abdominal pain. Patient appears uncomfortable in the ED, actively vomiting black emesis. Patient initially hypertensive with a blood pressure of 202/172. He has not had his BP medication in the month. He has also been off his Coumadin and insulin for 1 month as well. Patient given IV Protonix, fluids, Zofran and pain medication with significant symptomatic improvement. Patient was also given IV hydralazine as this was the medication he was previously taking for blood pressure. This lowered his BP and is now 163/93. BUN 17. Given that this is within normal range suspect that the black emesis may not actually be blood. Gastricult ordered. His  hemoglobin is stable at 15.8. This is actually improved since last year. His INR is 1.1, this is subtherapeutic for him. KUB negative  for free air. Will obtain CT abd/pelvis. Pt signed out to Christ Kick, MD at shift change pending CT abd. If negative and pt vitals remain stable, pt may be safe for discharge with protonix and GI follow up.    Dondra Spry [place], PA-C 09/16/15 2058  Daleen Bo, MD 09/17/15 [phone]

## 2015-09-16 NOTE — ED Provider Notes (Signed)
  Face-to-face evaluation   History: He presents for evaluation of emesis, dark in color, and states that his reflux is acting up. He also plans a headache.  Physical exam: Alert, calm, cooperative. He is hypertensive. He is mentating normally. No facial asymmetry. Moves arms and legs equally.  Medications  pantoprazole (PROTONIX) 80 mg in sodium chloride 0.9 % 250 mL (0.32 mg/mL) infusion (8 mg/hr Intravenous New Bag/Given 09/16/15 2022)  sodium chloride 0.9 % bolus 1,000 mL (not administered)  sodium chloride 0.9 % bolus 500 mL (500 mLs Intravenous New Bag/Given 09/16/15 1858)  ondansetron (ZOFRAN) injection 4 mg (4 mg Intravenous Given 09/16/15 1859)  morphine 4 MG/ML injection 4 mg (4 mg Intravenous Given 09/16/15 1901)  hydrALAZINE (APRESOLINE) injection 20 mg (20 mg Intravenous Given 09/16/15 1937)  pantoprazole (PROTONIX) 80 mg in sodium chloride 0.9 % 100 mL IVPB (0 mg Intravenous Stopped 09/16/15 2022)  iopamidol (ISOVUE-300) 61 % injection 100 mL (100 mLs Intravenous Contrast Given 09/16/15 2103)    Patient Vitals for the past 24 hrs:  BP Temp Temp src Pulse Resp SpO2 Height Weight  09/16/15 2007 169/93 mmHg 98.5 F (36.9 C) Oral 103 21 100 % - -  09/16/15 2000 169/93 mmHg - - 104 14 100 % - -  09/16/15 1937 (!) 207/116 mmHg - - - - - - -  09/16/15 1900 (!) 207/130 mmHg - - 96 19 98 % - -  09/16/15 1823 (!) 202/127 mmHg 98.5 F (36.9 C) Oral 96 18 96 % 6' (1.829 m) 235 lb (106.595 kg)    9:56 PM Reevaluation with update and discussion. After initial assessment and treatment, an updated evaluation reveals He states that he ran out of his insulin, 3 days ago, and his blood pressure medicine one month ago. He does not have any money with which to procedures medications. Shakil Dirk L     MDM- vomiting, suspected blood-containing products, but normal BUN. Hypertension related to lack of prescribed medication, and elevated glucose for same reason. Patient does not know where he can  get money to his medicine. He has moderately hydration, and ketosis, with normal anion gap. He will require admission for stabilization, and medication, to prevent worsening of his condition.   9:58 PM-Consult complete with hospitalist. Patient case explained and discussed. He agrees to admit patient for further evaluation and treatment. Call ended at 22:22   Medical screening examination/treatment/procedure(s) were conducted as a shared visit with non-physician practitioner(s) and myself.  I personally evaluated the patient during the encounter  Daleen Bo, MD 09/17/15 [phone]

## 2015-09-16 NOTE — ED Notes (Signed)
PA at the bedside.

## 2015-09-16 NOTE — ED Notes (Addendum)
Per EMS pt c/o abdominal pain and vomiting x 2 days.  Reports watery, black emesis.  Reports mid abdominal pain.  States 6 episodes of vomiting over the past 2 days.  Reports ran out of all medications 3 days ago.  Noted to be hypertensive with EMS and on arrival to ED.  Patient reports that he has been unable to keep and food or liquids down x 2 days.

## 2015-09-16 NOTE — H&P (Addendum)
History and Physical    [name] [id number] DOB: 07/08/1955 DOA: 09/16/2015  Referring MD/NP/PA: Dr. Eulis Foster PCP: [name], NP  Patient coming from: Home  Chief Complaint: Nausea/ Vomiting  HPI: Thomas Sosa is a 60 y.o. male with medical history significant of HTN, DM 2, prostate cancer s/p radioactive seed implantation, CAD, tobacco abuse, GERD, CVA previously on anticoagulation therapy; who presents with complaints of nausea and vomiting over the last 2 days. Since start of symptoms patient notes that he can vomit has been dark black in color like coffee grounds. He's had at least 4 episodes yesterday and 4 episodes today. He has not tried anything to relieve symptoms as he is out of all of his medications. He is been without most of his medications over the last month except for insulin, omeprazole, and gabapentin. He states that he ran out of these 3 medications approximately 3 days ago and does not have the money to fill any prescriptions that he may be given at this time. He states that he has pretty severe acid reflux and has just been drinking water over the last 2 days as he's not been able to tolerate any food. Associated symptoms include a constant epigastric and left side abdominal pain, lightheadedness with changes in position, urinary frequency, and constipation(last bowel movement 4 days ago). Denies having any severe shortness of breath, open wounds, fever, or  headache. ED Course: Upon admission into the emergency department patient was evaluated and seen to have at least one episode of emesis while in the ED. Patient was seen to be afebrile, with pulse up to 103, blood pressure 207/130, no other vital signs relatively within normal limits. Lab work was unremarkable except for chloride 96 and blood glucose of 277. Patient's emesis was not initially guaiac and the specimen was started away. He is placed on a Protonix drip and given 2 L of normal saline IV fluids along with 20  mg of hydralazine to control his blood pressures..  Review of Systems: As per HPI otherwise 10 point review of systems negative.   Past Medical History  Diagnosis Date  . Hypertension   . Diabetes mellitus without complication ([place])   . Diabetic neuropathy ([place]) Dx 2011  . Cancer of prostate ([place]) Dx 2005  . Hyperlipidemia   . Coronary artery disease   . Tobacco abuse   . Peripheral arterial disease ([place])   . Stroke ([place])   . Hemiparesis and alteration of sensations as late effects of stroke ([place]) 02/28/2015    Past Surgical History  Procedure Laterality Date  . Replacement total knee  Lt 2013/ Rt 2005  . Ep implantable device N/A 10/13/2014    Procedure: ICD Implant;  Surgeon: Deboraha Sprang, MD;  Location: [facility];  Service: Cardiovascular;  Laterality: N/A;  . Tee without cardioversion N/A 10/11/2014    Procedure: TRANSESOPHAGEAL ECHOCARDIOGRAM (TEE);  Surgeon: Dorothy Spark, MD;  Location: [facility] ENDOSCOPY;  Service: Cardiovascular;  Laterality: N/A;     reports that he quit smoking about 17 months ago. He has never used smokeless tobacco. He reports that he does not drink alcohol or use illicit drugs.  Allergies  Allergen Reactions  . Lisinopril Cough    Family History  Problem Relation Age of Onset  . Heart Problems Mother   . Heart Problems Father   . Heart attack Brother     Prior to Admission medications   Medication Sig Start Date End Date Taking? Authorizing Provider  amitriptyline (ELAVIL) 50 MG tablet Take 50 mg by mouth at bedtime.   Yes Historical Provider, MD  aspirin 81 MG EC tablet Take 1 tablet (81 mg total) by mouth daily. 02/10/15  Yes [name], NP  atorvastatin (LIPITOR) 80 MG tablet Take 1 tablet (80 mg total) by mouth daily. 04/27/15  Yes [name], NP  carvedilol (COREG) 12.5 MG tablet Take 1 tablet (12.5 mg total) by mouth 2 (two) times daily. 05/31/15  Yes Deboraha Sprang, MD  gabapentin (NEURONTIN) 600 MG tablet Take 1.5 tablets  (900 mg total) by mouth 3 (three) times daily. Must have office visit for refills 07/12/15  Yes Tresa Garter, MD  glipiZIDE (GLUCOTROL XL) 5 MG 24 hr tablet Take 1 tablet (5 mg total) by mouth daily with breakfast. 07/12/15  Yes Tresa Garter, MD  hydrALAZINE (APRESOLINE) 25 MG tablet Take 1 tablet (25 mg total) by mouth 2 (two) times daily. 06/15/15  Yes Luke K Kilroy, PA-C  insulin detemir (LEVEMIR) 100 UNIT/ML injection Inject 0.35 mLs (35 Units total) into the skin 2 (two) times daily. 02/14/15  Yes [name], NP  isosorbide dinitrate (ISORDIL) 20 MG tablet Take 1 tablet (20 mg total) by mouth 2 (two) times daily. 06/15/15  Yes Luke K Kilroy, PA-C  omeprazole (PRILOSEC) 20 MG capsule Take 1 capsule (20 mg total) by mouth daily. Acid reflux 04/27/15  Yes [name], NP  polyethylene glycol (MIRALAX / GLYCOLAX) packet Take 17 g by mouth 2 (two) times daily. 04/27/15  Yes [name], NP  warfarin (COUMADIN) 5 MG tablet Take 2 tablets (10 mg) everyday except Sunday take 1.5 tablets (7.5 mg) Patient taking differently: Take 7.5-15 mg by mouth daily. Take 2 tablets (10 mg) everyday except Take 3 tablets (15 mg) on Tues and Take 1.5 tablets (7.5 mg) on Sunday 07/12/15  Yes Olugbemiga E Doreene Burke, MD  HYDROcodone-acetaminophen (NORCO/VICODIN) 5-325 MG tablet Take 1-2 tablets by mouth every 6 (six) hours as needed for moderate pain. Patient not taking: Reported on 09/16/2015 01/16/15   Fredia Sorrow, MD  insulin aspart (NOVOLOG) 100 UNIT/ML injection Inject 10 Units into the skin 3 (three) times daily before meals. < 400 = 14 units; > 400 = call MD 10/14/14   Elmarie Shiley, MD  losartan (COZAAR) 50 MG tablet Take 50 mg by mouth daily.  09/15/15   Historical Provider, MD  methocarbamol (ROBAXIN) 500 MG tablet Take 1 tablet (500 mg total) by mouth 2 (two) times daily. 09/14/15   Tatyana Kirichenko, PA-C  ONE TOUCH ULTRA TEST test strip TEST four times a day 06/07/14   [name], NP    oxyCODONE-acetaminophen (PERCOCET/ROXICET) 5-325 MG tablet Take 1 tablet by mouth every 8 (eight) hours as needed for moderate pain or severe pain.  01/06/15   Historical Provider, MD  penicillin v potassium (VEETID) 500 MG tablet Take 1 tablet (500 mg total) by mouth 4 (four) times daily. Patient not taking: Reported on 09/16/2015 01/16/15   Fredia Sorrow, MD  traMADol (ULTRAM) 50 MG tablet Take 2 tablets (100 mg total) by mouth every 6 (six) hours as needed for moderate pain. 10/14/14   Elmarie Shiley, MD    Physical Exam: Filed Vitals:   09/16/15 1900 09/16/15 1937 09/16/15 2000 09/16/15 2007  BP: 207/130 207/116 169/93 169/93  Pulse: 96  104 103  Temp:    98.5 F (36.9 C)  TempSrc:    Oral  Resp: 19  14 21  Height:      Weight:      SpO2: 98%  100% 100%      Constitutional: NAD, calm, comfortable Filed Vitals:   09/16/15 1900 09/16/15 1937 09/16/15 2000 09/16/15 2007  BP: 207/130 207/116 169/93 169/93  Pulse: 96  104 103  Temp:    98.5 F (36.9 C)  TempSrc:    Oral  Resp: 19  14 21   Height:      Weight:      SpO2: 98%  100% 100%   Eyes: PERRL, lids and conjunctivae normal ENMT: Mucous membranes are moist. Posterior pharynx clear of any exudate or lesions.Normal dentition.  Neck: normal, supple, no masses, no thyromegaly Respiratory: clear to auscultation bilaterally, no wheezing, no crackles. Normal respiratory effort. No accessory muscle use.  Cardiovascular: Regular rate and rhythm, no murmurs / rubs / gallops. No extremity edema. 2+ pedal pulses. No carotid bruits.  Abdomen: Tenderness to palpation epigastrically and on the left flank., no masses palpated. No hepatosplenomegaly. Bowel sounds positive.  Musculoskeletal: no clubbing / cyanosis. No joint deformity upper and lower extremities. Good ROM, no contractures. Normal muscle tone.  Skin: no rashes, lesions, ulcers. No induration Neurologic: CN 2-12 grossly intact. Sensation intact, DTR normal. Strength 5/5  in all 4.  Psychiatric: Normal judgment and insight. Alert and oriented x 3. Normal mood.     Labs on Admission: I have personally reviewed following labs and imaging studies  CBC:  Recent Labs Lab 09/16/15 1832  WBC 7.0  HGB 15.8  HCT 44.9  MCV 87.0  PLT [id number]   Basic Metabolic Panel:  Recent Labs Lab 09/16/15 1923  NA 140  K 3.5  CL 96*  CO2 30  GLUCOSE 277*  BUN 17  CREATININE 0.88  CALCIUM 9.0   GFR: Estimated Creatinine Clearance: 112.6 mL/min (by C-G formula based on Cr of 0.88). Liver Function Tests:  Recent Labs Lab 09/16/15 1923  AST 13*  ALT 11*  ALKPHOS 80  BILITOT 1.3*  PROT 8.2*  ALBUMIN 3.4*    Recent Labs Lab 09/16/15 1923  LIPASE 17   No results for input(s): AMMONIA in the last 168 hours. Coagulation Profile:  Recent Labs Lab 09/16/15 1923  INR 1.11   Cardiac Enzymes: No results for input(s): CKTOTAL, CKMB, CKMBINDEX, TROPONINI in the last 168 hours. BNP (last 3 results) No results for input(s): PROBNP in the last 8760 hours. HbA1C: No results for input(s): HGBA1C in the last 72 hours. CBG:  Recent Labs Lab 09/16/15 1839  GLUCAP 287*   Lipid Profile: No results for input(s): CHOL, HDL, LDLCALC, TRIG, CHOLHDL, LDLDIRECT in the last 72 hours. Thyroid Function Tests: No results for input(s): TSH, T4TOTAL, FREET4, T3FREE, THYROIDAB in the last 72 hours. Anemia Panel: No results for input(s): VITAMINB12, FOLATE, FERRITIN, TIBC, IRON, RETICCTPCT in the last 72 hours. Urine analysis:    Component Value Date/Time   COLORURINE YELLOW 09/16/2015 2000   APPEARANCEUR CLEAR 09/16/2015 2000   LABSPEC 1.041* 09/16/2015 2000   PHURINE 5.5 09/16/2015 2000   GLUCOSEU >1000* 09/16/2015 2000   HGBUR SMALL* 09/16/2015 2000   BILIRUBINUR SMALL* 09/16/2015 2000   BILIRUBINUR neg 02/21/2015 1720   KETONESUR >80* 09/16/2015 2000   PROTEINUR >300* 09/16/2015 2000   PROTEINUR 100 02/21/2015 1720   UROBILINOGEN >=8.0 02/21/2015 1720    NITRITE NEGATIVE 09/16/2015 2000   NITRITE neg 02/21/2015 1720   LEUKOCYTESUR NEGATIVE 09/16/2015 2000   Sepsis Labs: No results found for this or any  previous visit (from the past 240 hour(s)).   Radiological Exams on Admission: Dg Abd 1 View  09/16/2015  CLINICAL DATA:  Acute onset of coffee-ground emesis and mid abdominal pain. Initial encounter. EXAM: ABDOMEN - 1 VIEW COMPARISON:  None. FINDINGS: The visualized bowel gas pattern is unremarkable. Scattered air and stool filled loops of colon are seen; no abnormal dilatation of small bowel loops is seen to suggest small bowel obstruction. No free intra-abdominal air is identified, though evaluation for free air is limited on a single supine view. The visualized osseous structures are within normal limits; the sacroiliac joints are unremarkable in appearance. Brachytherapy seeds are partially imaged at the prostate bed. IMPRESSION: Unremarkable bowel gas pattern; no free intra-abdominal air seen. Electronically Signed   By: Garald Balding M.D.   On: 09/16/2015 19:42   Ct Abdomen Pelvis W Contrast  09/16/2015  CLINICAL DATA:  Vomiting coffee ground emesis, epigastric pain EXAM: CT ABDOMEN AND PELVIS WITH CONTRAST TECHNIQUE: Multidetector CT imaging of the abdomen and pelvis was performed using the standard protocol following bolus administration of intravenous contrast. CONTRAST:  132mL ISOVUE-300 IOPAMIDOL (ISOVUE-300) INJECTION 61% COMPARISON:  CT chest 10/04/2014 FINDINGS: Lower chest: Lung bases are unremarkable. Heart size within normal limits. No pericardial effusion. There is circumferential thickening of distal esophageal wall up to 1.3 cm. Esophagitis or gastroesophageal reflux cannot be excluded. Clinical correlation is necessary. Hepatobiliary: Enhanced liver shows no biliary ductal dilatation. No solid hepatic mass. No calcified gallstones are noted within contracted gallbladder. Pancreas: Enhanced pancreas is unremarkable. Spleen: Enhanced  spleen is unremarkable. Adrenals/Urinary Tract: No adrenal gland mass. Enhanced kidneys are symmetrical in size. No hydronephrosis or hydroureter. Delayed renal images shows bilateral renal symmetrical excretion. Bilateral visualized proximal ureter is unremarkable. The urinary bladder is moderate distended. Stomach/Bowel: There is no gastric outlet obstruction. No small bowel obstruction. No thickened or dilated small bowel loops. Normal appendix is noted in axial image 55. There is no pericecal inflammation. No colonic obstruction. Few diverticula are noted descending colon. No evidence of acute diverticulitis. No distal colonic obstruction. Vascular/Lymphatic: Atherosclerotic plaques and calcifications are noted abdominal aorta. Atherosclerotic calcifications are noted SMA. Atherosclerotic calcifications are noted celiac trunk origin and SMA origin. Atherosclerotic plaques and calcifications are noted bilateral common iliac arteries. 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 Atherosclerotic calcifications are noted bilateral femoral artery. Reproductive: Multiple radiation seeds are noted in prostate gland region. No pelvic mass is noted. No pelvic adenopathy. No inguinal adenopathy. Other: There is no evidence of ascites or free abdominal air. Musculoskeletal: No destructive bony lesions are noted. Sagittal images of the spine shows degenerative changes lower thoracic spine. Mild degenerative changes lumbar spine. No destructive bony lesions are noted within pelvis. Mild degenerative changes bilateral SI joints. IMPRESSION: 1. There is significant thickening of distal esophageal wall measures up to 1.3 cm thickness. Esophagitis or gastroesophageal reflux cannot be excluded. Clinical correlation is necessary. 2. No small bowel obstruction. 3. Normal appendix.  No pericecal inflammation. 4. No hydronephrosis or  hydroureter. 5. Atherosclerotic vascular calcifications are noted. No aortic aneurysm. 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 Right common iliac artery measures 2.3 cm in diameter. Left common iliac artery measures 2.2 cm in diameter. 6. Radiation seeds are noted in prostate gland region. 7. Degenerative changes thoracolumbar spine. Electronically Signed   By: Lahoma Crocker M.D.   On: 09/16/2015 21:34  EKG: Independently reviewed. Sinus rhythm with PVCs  Assessment/Plan Possible Hematemesis/GI bleed: Acute patient with at least 8 episodes of dark black vomitus over the last 2 days. Patient had one episode in the ER, but sample was not hemoccult tested. - Acute admit to a telemetry bed - NPO except for ice chips - Protonix drip - Check hemoccult of gastric contents, if patient has a another episode of emesis - Zofran prn N/V - Will need to consult GI in the a.m.   Suspected acute blood loss anemia: Patient's hemoglobin on admission noted be 15.8 - H&H q 4hrs - T&S for possible need of blood products  Essential hypertension: Uncontrolled. Blood pressures as high as 207/116 on admission. Patient has being out of home blood pressure medications. - Hydralazine IV when necessary  Diabetes mellitus type 2 with hyperglycemia blood glucose on admission 277 with no signs of anion gap - CBGs every 4 hours for now with a sensitive sliding scale insulin  Aneurysmal dilatation bilateral common iliac artery. Incidental findings on CT scan Right common iliac artery measures 2.3 cm in diameter. Left common iliac artery measures 2.2 cm in diameter. - will need to have follow-up and monitoring as outpatient  No primary care provider/ inability to obtain medications - Social work consult  Ischemic cardiomyopathy s/p AICD   History of CVA on chronic anticoagulation therapy: Patient has been off  anticoagulation therapy of warfarin for the last month. - On hold currently in the  setting of a possible GI bleed  Will need to reconcile all patients meds when tolerating oral intake    DVT prophylaxis: SCDs   Code Status: Full Family Communication: None Disposition Plan:  Consults called: None Admission status: Observation telemetry  Norval Morton MD [facility] Pager [phone]  If 7PM-7AM, please contact night-coverage [email or web address] Password [facility]  09/16/2015, 10:13 PM

## 2015-09-16 NOTE — ED Notes (Signed)
Patient vomited 563mL black emesis.  ED PA aware.

## 2015-09-17 ENCOUNTER — Encounter (HOSPITAL_COMMUNITY): Payer: Self-pay | Admitting: Emergency Medicine

## 2015-09-17 DIAGNOSIS — E118 Type 2 diabetes mellitus with unspecified complications: Secondary | ICD-10-CM | POA: Diagnosis not present

## 2015-09-17 DIAGNOSIS — K922 Gastrointestinal hemorrhage, unspecified: Secondary | ICD-10-CM | POA: Diagnosis not present

## 2015-09-17 DIAGNOSIS — Z95810 Presence of automatic (implantable) cardiac defibrillator: Secondary | ICD-10-CM | POA: Diagnosis not present

## 2015-09-17 DIAGNOSIS — I255 Ischemic cardiomyopathy: Secondary | ICD-10-CM | POA: Diagnosis not present

## 2015-09-17 LAB — TYPE AND SCREEN
ABO/RH(D): A POS
Antibody Screen: NEGATIVE

## 2015-09-17 LAB — ABO/RH: ABO/RH(D): A POS

## 2015-09-17 LAB — BASIC METABOLIC PANEL
Anion gap: 8 (ref 5–15)
BUN: 16 mg/dL (ref 6–20)
CO2: 31 mmol/L (ref 22–32)
Calcium: 8.6 mg/dL — ABNORMAL LOW (ref 8.9–10.3)
Chloride: 99 mmol/L — ABNORMAL LOW (ref 101–111)
Creatinine, Ser: 0.8 mg/dL (ref 0.61–1.24)
Glucose, Bld: 223 mg/dL — ABNORMAL HIGH (ref 65–99)
Potassium: 3.6 mmol/L (ref 3.5–5.1)
Sodium: 138 mmol/L (ref 135–145)

## 2015-09-17 LAB — TROPONIN I
TROPONIN I: 0.05 ng/mL — AB (ref <0.031)
Troponin I: 0.06 ng/mL — ABNORMAL HIGH (ref <0.031)
Troponin I: 0.06 ng/mL — ABNORMAL HIGH (ref <0.031)

## 2015-09-17 LAB — HEMOGLOBIN AND HEMATOCRIT, BLOOD
HCT: 38.5 % — ABNORMAL LOW (ref 39.0–52.0)
HCT: 40.8 % (ref 39.0–52.0)
HCT: 41.4 % (ref 39.0–52.0)
HCT: 45.2 % (ref 39.0–52.0)
HEMATOCRIT: 38.7 % — AB (ref 39.0–52.0)
HEMOGLOBIN: 14.1 g/dL (ref 13.0–17.0)
HEMOGLOBIN: 13.2 g/dL (ref 13.0–17.0)
HEMOGLOBIN: 13.7 g/dL (ref 13.0–17.0)
HEMOGLOBIN: 15.7 g/dL (ref 13.0–17.0)
Hemoglobin: 14.3 g/dL (ref 13.0–17.0)

## 2015-09-17 LAB — GLUCOSE, CAPILLARY
GLUCOSE-CAPILLARY: 180 mg/dL — AB (ref 65–99)
GLUCOSE-CAPILLARY: 194 mg/dL — AB (ref 65–99)
GLUCOSE-CAPILLARY: 253 mg/dL — AB (ref 65–99)
Glucose-Capillary: 202 mg/dL — ABNORMAL HIGH (ref 65–99)
Glucose-Capillary: 158 mg/dL — ABNORMAL HIGH (ref 65–99)
Glucose-Capillary: 181 mg/dL — ABNORMAL HIGH (ref 65–99)

## 2015-09-17 NOTE — Clinical Social Work Note (Addendum)
CSW received consults on 6/23 and 6/24 regarding patient not being able to afford his medications and needing a PCP. In the attending MD's 6/24 progress note, the plan is to use case manager for medication assistance. CSW signing off as a consult has been made to case manager for patient's medication needs.  Cassandr Cederberg Givens, MSW, LCSW Licensed Clinical Social Worker [place] [phone]

## 2015-09-17 NOTE — Progress Notes (Signed)
PROGRESS NOTE    Thomas Sosa  [id number] DOB: 02-Aug-1955 DOA: 09/16/2015 PCP: [name], NP   Brief Narrative:  60 year old male history of hypertension, diabetes, prostate cancer status post radioactive seed implantation, CVA on anticoagulation therapy, presented with complaints of nausea and vomiting with dark emesis. Patient stated he had run out of his medications for proximal one month. He also ran out of his omeprazole proximally 3 days ago. His nausea and vomiting started approximately 2 days ago, he has been unable tolerate any food or drink. Of note, patient has been off of his Coumadin for approximately one month as he was unable to afford any of his medications. Admitted for hematemesis possible GI bleed. GI consulted and pending.  Assessment & Plan   Hematemesis/possible GI bleed -Patient did have his omeprazole 3 days ago, nausea and vomiting started around the same time. -Pending Hemoccult of gastric contents, however patient has not had another episode of emesis during hospitalization -Hemoglobin and hematocrit currently 13.7/38.7 , continue to monitor -Continue Protonix drip, Zofran for nausea and vomiting -Currently NPO -Gastroneurology consult and appreciated  Acute blood loss anemia -Hemoglobin upon admission 14.3, upon reviewing records, patient has been ranging from 13-15. -Continue treatment plan as above  Essential hypertension -Patient about obtaining his home blood pressure medications -Currently NPO -Continue hydralazine as needed  Diabetes mellitus, type II -Continue CBG monitoring with insulin sliding scale  Aneurysmal dilatation bilateral common iliac artery -Incidental finding on CT scan -Outpatient monitoring  Ischemic cardiomyopathy/coronary artery disease -Denies any chest pain at this time -Troponin mildly elevated 0.06, continue to monitor. If continues to rise, will consult cardiology -Status post AICD -Echocardiogram 10/06/2014  shows an EF of 25-30%  History of CVA -Was on chronic anticoagulation with Coumadin however patient has been off of his warfarin for the last month -Will not start at this point given possible GI bleed  DVT Prophylaxis  SCDs  Code Status: Full  Family Communication: None at bedside  Disposition Plan: Admitted for observation. Pending GI consult. Will consult case management for medication assistance.  Consultants Gastroenterology  Procedures  None  Antibiotics   Anti-infectives    None      Subjective:   Thomas Sosa seen and examined today.  Patient denies any current nausea or vomiting. Has not vomited since admission. Denies any abdominal pain. States urine in his medications approximately one month ago due to financial reasons. Denies any chest pain, shortness of breath, disease or headache.  Objective:   Filed Vitals:   09/16/15 2000 09/16/15 2007 09/16/15 2345 09/17/15 0508  BP: 169/93 169/93  160/87  Pulse: 104 103 113 98  Temp:  98.5 F (36.9 C) 98.4 F (36.9 C) 97.8 F (36.6 C)  TempSrc:  Oral Oral Oral  Resp: 14 21 18 20   Height:      Weight:      SpO2: 100% 100% 99% 99%    Intake/Output Summary (Last 24 hours) at 09/17/15 1207 Last data filed at 09/17/15 0644  Gross per 24 hour  Intake 1259.17 ml  Output   1250 ml  Net   9.17 ml   Filed Weights   09/16/15 1823  Weight: 106.595 kg (235 lb)    Exam  General: Well developed, well nourished, NAD, appears stated age  HEENT: NCAT, mucous membranes moist.   Cardiovascular: S1 S2 auscultated, no rubs, murmurs or gallops. Regular rate and rhythm.  Respiratory: Clear to auscultation bilaterally   Abdomen: Soft, nontender, nondistended, + bowel sounds  Extremities: warm dry without cyanosis clubbing or edema  Neuro: AAOx3, nonfocal  Psych: Normal affect and demeanor    Data Reviewed: I have personally reviewed following labs and imaging studies  CBC:  Recent Labs Lab 09/16/15 1832  09/16/15 2354 09/17/15 0257 09/17/15 0615 09/17/15 1042  WBC 7.0  --   --   --   --   HGB 15.8 15.7 14.3 13.2 13.7  HCT 44.9 45.2 40.8 38.5* 38.7*  MCV 87.0  --   --   --   --   PLT 270  --   --   --   --    Basic Metabolic Panel:  Recent Labs Lab 09/16/15 1923 09/17/15 0257  NA 140 138  K 3.5 3.6  CL 96* 99*  CO2 30 31  GLUCOSE 277* 223*  BUN 17 16  CREATININE 0.88 0.80  CALCIUM 9.0 8.6*   GFR: Estimated Creatinine Clearance: 123.9 mL/min (by C-G formula based on Cr of 0.8). Liver Function Tests:  Recent Labs Lab 09/16/15 1923  AST 13*  ALT 11*  ALKPHOS 80  BILITOT 1.3*  PROT 8.2*  ALBUMIN 3.4*    Recent Labs Lab 09/16/15 1923  LIPASE 17   No results for input(s): AMMONIA in the last 168 hours. Coagulation Profile:  Recent Labs Lab 09/16/15 1923  INR 1.11   Cardiac Enzymes:  Recent Labs Lab 09/16/15 2354 09/17/15 0615 09/17/15 1042  TROPONINI 0.05* 0.06* 0.06*   BNP (last 3 results) No results for input(s): PROBNP in the last 8760 hours. HbA1C: No results for input(s): HGBA1C in the last 72 hours. CBG:  Recent Labs Lab 09/16/15 1839 09/17/15 0012 09/17/15 0359 09/17/15 0743 09/17/15 1139  GLUCAP 287* 253* 202* 158* 180*   Lipid Profile: No results for input(s): CHOL, HDL, LDLCALC, TRIG, CHOLHDL, LDLDIRECT in the last 72 hours. Thyroid Function Tests: No results for input(s): TSH, T4TOTAL, FREET4, T3FREE, THYROIDAB in the last 72 hours. Anemia Panel: No results for input(s): VITAMINB12, FOLATE, FERRITIN, TIBC, IRON, RETICCTPCT in the last 72 hours. Urine analysis:    Component Value Date/Time   COLORURINE YELLOW 09/16/2015 2000   APPEARANCEUR CLEAR 09/16/2015 2000   LABSPEC 1.041* 09/16/2015 2000   PHURINE 5.5 09/16/2015 2000   GLUCOSEU >1000* 09/16/2015 2000   HGBUR SMALL* 09/16/2015 2000   BILIRUBINUR SMALL* 09/16/2015 2000   BILIRUBINUR neg 02/21/2015 1720   KETONESUR >80* 09/16/2015 2000   PROTEINUR >300* 09/16/2015  2000   PROTEINUR 100 02/21/2015 1720   UROBILINOGEN >=8.0 02/21/2015 1720   NITRITE NEGATIVE 09/16/2015 2000   NITRITE neg 02/21/2015 1720   LEUKOCYTESUR NEGATIVE 09/16/2015 2000   Sepsis Labs: @LABRCNTIP (procalcitonin:4,lacticidven:4)  )No results found for this or any previous visit (from the past 240 hour(s)).    Radiology Studies: Dg Abd 1 View  09/16/2015  CLINICAL DATA:  Acute onset of coffee-ground emesis and mid abdominal pain. Initial encounter. EXAM: ABDOMEN - 1 VIEW COMPARISON:  None. FINDINGS: The visualized bowel gas pattern is unremarkable. Scattered air and stool filled loops of colon are seen; no abnormal dilatation of small bowel loops is seen to suggest small bowel obstruction. No free intra-abdominal air is identified, though evaluation for free air is limited on a single supine view. The visualized osseous structures are within normal limits; the sacroiliac joints are unremarkable in appearance. Brachytherapy seeds are partially imaged at the prostate bed. IMPRESSION: Unremarkable bowel gas pattern; no free intra-abdominal air seen. Electronically Signed   By: Garald Balding M.D.   On:  09/16/2015 19:42   Ct Abdomen Pelvis W Contrast  09/16/2015  CLINICAL DATA:  Vomiting coffee ground emesis, epigastric pain EXAM: CT ABDOMEN AND PELVIS WITH CONTRAST TECHNIQUE: Multidetector CT imaging of the abdomen and pelvis was performed using the standard protocol following bolus administration of intravenous contrast. CONTRAST:  134mL ISOVUE-300 IOPAMIDOL (ISOVUE-300) INJECTION 61% COMPARISON:  CT chest 10/04/2014 FINDINGS: Lower chest: Lung bases are unremarkable. Heart size within normal limits. No pericardial effusion. There is circumferential thickening of distal esophageal wall up to 1.3 cm. Esophagitis or gastroesophageal reflux cannot be excluded. Clinical correlation is necessary. Hepatobiliary: Enhanced liver shows no biliary ductal dilatation. No solid hepatic mass. No calcified  gallstones are noted within contracted gallbladder. Pancreas: Enhanced pancreas is unremarkable. Spleen: Enhanced spleen is unremarkable. Adrenals/Urinary Tract: No adrenal gland mass. Enhanced kidneys are symmetrical in size. No hydronephrosis or hydroureter. Delayed renal images shows bilateral renal symmetrical excretion. Bilateral visualized proximal ureter is unremarkable. The urinary bladder is moderate distended. Stomach/Bowel: There is no gastric outlet obstruction. No small bowel obstruction. No thickened or dilated small bowel loops. Normal appendix is noted in axial image 55. There is no pericecal inflammation. No colonic obstruction. Few diverticula are noted descending colon. No evidence of acute diverticulitis. No distal colonic obstruction. Vascular/Lymphatic: Atherosclerotic plaques and calcifications are noted abdominal aorta. Atherosclerotic calcifications are noted SMA. Atherosclerotic calcifications are noted celiac trunk origin and SMA origin. Atherosclerotic plaques and calcifications are noted bilateral common iliac arteries. 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 Atherosclerotic calcifications are noted bilateral femoral artery. Reproductive: Multiple radiation seeds are noted in prostate gland region. No pelvic mass is noted. No pelvic adenopathy. No inguinal adenopathy. Other: There is no evidence of ascites or free abdominal air. Musculoskeletal: No destructive bony lesions are noted. Sagittal images of the spine shows degenerative changes lower thoracic spine. Mild degenerative changes lumbar spine. No destructive bony lesions are noted within pelvis. Mild degenerative changes bilateral SI joints. IMPRESSION: 1. There is significant thickening of distal esophageal wall measures up to 1.3 cm thickness. Esophagitis or gastroesophageal reflux cannot be excluded. Clinical correlation is  necessary. 2. No small bowel obstruction. 3. Normal appendix.  No pericecal inflammation. 4. No hydronephrosis or hydroureter. 5. Atherosclerotic vascular calcifications are noted. No aortic aneurysm. 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 Right common iliac artery measures 2.3 cm in diameter. Left common iliac artery measures 2.2 cm in diameter. 6. Radiation seeds are noted in prostate gland region. 7. Degenerative changes thoracolumbar spine. Electronically Signed   By: Lahoma Crocker M.D.   On: 09/16/2015 21:34     Scheduled Meds: . sodium chloride   Intravenous STAT  . sodium chloride   Intravenous Once  . insulin aspart  0-9 Units Subcutaneous Q4H  . sodium chloride flush  3 mL Intravenous Q12H   Continuous Infusions: . pantoprozole (PROTONIX) infusion 8 mg/hr (09/17/15 [id number])       Time Spent in minutes   30 minutes  Annalyce Lanpher D.O. on 09/17/2015 at 12:07 PM  Between 7am to 7pm - Pager - [phone]  After 7pm go to [email or web address] - password [email or web address]  And look for the night coverage person covering for me after hours  [facility] Office  [phone]

## 2015-09-18 DIAGNOSIS — I255 Ischemic cardiomyopathy: Secondary | ICD-10-CM | POA: Diagnosis not present

## 2015-09-18 DIAGNOSIS — K920 Hematemesis: Secondary | ICD-10-CM | POA: Diagnosis not present

## 2015-09-18 DIAGNOSIS — R110 Nausea: Secondary | ICD-10-CM | POA: Diagnosis not present

## 2015-09-18 DIAGNOSIS — K922 Gastrointestinal hemorrhage, unspecified: Secondary | ICD-10-CM | POA: Diagnosis not present

## 2015-09-18 DIAGNOSIS — E118 Type 2 diabetes mellitus with unspecified complications: Secondary | ICD-10-CM | POA: Diagnosis not present

## 2015-09-18 DIAGNOSIS — I159 Secondary hypertension, unspecified: Secondary | ICD-10-CM | POA: Insufficient documentation

## 2015-09-18 LAB — HEMOGLOBIN AND HEMATOCRIT, BLOOD
HCT: 38.9 % — ABNORMAL LOW (ref 39.0–52.0)
HEMOGLOBIN: 13.8 g/dL (ref 13.0–17.0)

## 2015-09-18 LAB — BASIC METABOLIC PANEL
ANION GAP: 9 (ref 5–15)
Anion gap: 8 (ref 5–15)
BUN: 12 mg/dL (ref 6–20)
BUN: 12 mg/dL (ref 6–20)
CALCIUM: 8.6 mg/dL — AB (ref 8.9–10.3)
CHLORIDE: 100 mmol/L — AB (ref 101–111)
CO2: 30 mmol/L (ref 22–32)
CO2: 29 mmol/L (ref 22–32)
Calcium: 8.4 mg/dL — ABNORMAL LOW (ref 8.9–10.3)
Chloride: 98 mmol/L — ABNORMAL LOW (ref 101–111)
Creatinine, Ser: 0.72 mg/dL (ref 0.61–1.24)
Creatinine, Ser: 0.69 mg/dL (ref 0.61–1.24)
GFR calc Af Amer: >60 mL/min (ref >60)
GFR calc non Af Amer: >60 mL/min (ref >60)
GLUCOSE: 186 mg/dL — AB (ref 65–99)
Glucose, Bld: 157 mg/dL — ABNORMAL HIGH (ref 65–99)
POTASSIUM: 3 mmol/L — AB (ref 3.5–5.1)
Potassium: 3 mmol/L — ABNORMAL LOW (ref 3.5–5.1)
SODIUM: 137 mmol/L (ref 135–145)
SODIUM: 137 mmol/L (ref 135–145)

## 2015-09-18 LAB — CBC
HCT: 40.6 % (ref 39.0–52.0)
HEMOGLOBIN: 13.9 g/dL (ref 13.0–17.0)
MCH: 29.9 pg (ref 26.0–34.0)
MCHC: 34.2 g/dL (ref 30.0–36.0)
MCV: 87.3 fL (ref 78.0–100.0)
Platelets: 238 10*3/uL (ref 150–400)
RBC: 4.65 MIL/uL (ref 4.22–5.81)
RDW: 12.8 % (ref 11.5–15.5)
WBC: 7.7 10*3/uL (ref 4.0–10.5)

## 2015-09-18 LAB — GLUCOSE, CAPILLARY
GLUCOSE-CAPILLARY: 179 mg/dL — AB (ref 65–99)
GLUCOSE-CAPILLARY: 234 mg/dL — AB (ref 65–99)
GLUCOSE-CAPILLARY: 250 mg/dL — AB (ref 65–99)
Glucose-Capillary: 175 mg/dL — ABNORMAL HIGH (ref 65–99)
Glucose-Capillary: 194 mg/dL — ABNORMAL HIGH (ref 65–99)
Glucose-Capillary: 165 mg/dL — ABNORMAL HIGH (ref 65–99)

## 2015-09-18 MED ORDER — PANTOPRAZOLE SODIUM 40 MG PO TBEC
40.0000 mg | DELAYED_RELEASE_TABLET | Freq: Two times a day (BID) | ORAL | Status: DC
  Administered 2015-09-18 – 2015-09-24 (×11): 40 mg via ORAL
  Filled 2015-09-18 (×11): qty 1

## 2015-09-18 MED ORDER — ZOLPIDEM TARTRATE 5 MG PO TABS: 5.0000 mg | ORAL_TABLET | Freq: Once | ORAL | Status: DC

## 2015-09-18 MED ORDER — CARVEDILOL 12.5 MG PO TABS
12.5000 mg | ORAL_TABLET | Freq: Two times a day (BID) | ORAL | Status: DC
  Administered 2015-09-18 – 2015-09-24 (×11): 12.5 mg via ORAL
  Filled 2015-09-18 (×12): qty 1

## 2015-09-18 MED ORDER — HYDRALAZINE HCL 25 MG PO TABS
25.0000 mg | ORAL_TABLET | Freq: Two times a day (BID) | ORAL | Status: DC
  Administered 2015-09-18 – 2015-09-24 (×12): 25 mg via ORAL
  Filled 2015-09-18 (×13): qty 1

## 2015-09-18 MED ORDER — POTASSIUM CHLORIDE CRYS ER 20 MEQ PO TBCR
40.0000 meq | EXTENDED_RELEASE_TABLET | Freq: Once | ORAL | Status: AC
  Administered 2015-09-18: 40 meq via ORAL
  Filled 2015-09-18: qty 2

## 2015-09-18 NOTE — Progress Notes (Signed)
PROGRESS NOTE    Thomas Sosa  [id number] DOB: 08/06/1955 DOA: 09/16/2015 PCP: Thomas Bosch, NP    Brief Narrative:  As written by Dr. Tamala Julian on 6/23 60 year old male history of hypertension, diabetes, prostate cancer status post radioactive seed implantation, CVA on anticoagulation therapy, presented with complaints of nausea and vomiting with dark emesis. Patient stated he had run out of his medications for proximal one month. He also ran out of his omeprazole proximally 3 days ago. His nausea and vomiting started approximately 2 days ago, he has been unable tolerate any food or drink. Of note, patient has been off of his Coumadin for approximately one month as he was unable to afford any of his medications. Admitted for hematemesis possible GI bleed. GI consulted and pending.  Interim History No further hematemesis.  Patient with persistent heartburn.  GI consult forthcoming.    Assessment & Plan:   Hematemesis, possible GI bleed - No further bleeding - Hgb has remained stable - Trend CBC q 12 hours - GI consult noted and appreciated - EGD tomorrow for further evaluation - Changed protonix to BID  Essential HTN - BP running high, since no procedure today will restart home coreg and hydralazine - PRN IV hydralazine for high blood pressure - given his history of CVA, do not want BP to run much higher.     DM type 2  - SSI, monitor CBG closely while NPO    Cardiomyopathy, ischemic, CAD - No chest pain or any changes in symptoms - Troponin stable at 0.06 - flat - Repeat for any chest pain  CVA - Has been off coumadin, no neuro deficits  Hypokalemia - K 3.0 - Replete with oral potassium today - Repeat BMET in the evening.   DVT prophylaxis: SCDs while concern for bleeding Code Status: Full  Family Communication: None at bedside Disposition Plan: EGD tomorrow, pending discharge after that.    Consultants:   GI  Procedures:   None  Antimicrobials:   None     Subjective: Thomas Sosa reports continues epigastric pain, heartburn to the throat, belching and hiccups.  He has not vomited since being admitted.  He was out of his omeprazole X 4 days at home prior to coming in.  Symptoms were controlled with omeprazole.   Objective: Filed Vitals:   09/17/15 2003 09/17/15 2017 09/17/15 2148 09/18/15 0546  BP: 201/99 180/92 164/97 158/104  Pulse: 88   93  Temp: 98.5 F (36.9 C)   98.1 F (36.7 C)  TempSrc: Oral   Oral  Resp: 22   20  Height:      Weight:      SpO2: 99%   98%    Intake/Output Summary (Last 24 hours) at 09/18/15 0929 Last data filed at 09/17/15 2300  Gross per 24 hour  Intake 646.67 ml  Output    400 ml  Net 246.67 ml   Filed Weights   09/16/15 1823  Weight: 235 lb (106.595 kg)    Examination:  General exam: Appears calm and comfortable  Respiratory system: Clear to auscultation. Respiratory effort normal. Cardiovascular system: S1 & S2 heard, RR, NR. No pedal edema. Gastrointestinal system: Abdomen is nondistended, soft and tender to palpation in the epigastrium and LUQ. Normal bowel sounds heard.  + hiccups on exam.  Central nervous system: Alert and oriented. No focal neurological deficits. Skin: No lesions or ulcers Psychiatry: Judgement and insight appear normal. Mood & affect appropriate.     Data Reviewed:  CBC:  Recent Labs Lab 09/16/15 1832  09/17/15 0257 09/17/15 0615 09/17/15 1042 09/17/15 2307 09/18/15 0516  WBC 7.0  --   --   --   --   --  7.7  HGB 15.8  < > 14.3 13.2 13.7 14.1 13.9  HCT 44.9  < > 40.8 38.5* 38.7* 41.4 40.6  MCV 87.0  --   --   --   --   --  87.3  PLT 270  --   --   --   --   --  238  < > = values in this interval not displayed. Basic Metabolic Panel:  Recent Labs Lab 09/16/15 1923 09/17/15 0257 09/18/15 0516  NA 140 138 137  K 3.5 3.6 3.0*  CL 96* 99* 98*  CO2 30 31 30   GLUCOSE 277* 223* 186*  BUN 17 16 12   CREATININE 0.88 0.80 0.72  CALCIUM 9.0 8.6* 8.6*    GFR: Estimated Creatinine Clearance: 123.9 mL/min (by C-G formula based on Cr of 0.72). Liver Function Tests:  Recent Labs Lab 09/16/15 1923  AST 13*  ALT 11*  ALKPHOS 80  BILITOT 1.3*  PROT 8.2*  ALBUMIN 3.4*    Recent Labs Lab 09/16/15 1923  LIPASE 17   Coagulation Profile:  Recent Labs Lab 09/16/15 1923  INR 1.11   Cardiac Enzymes:  Recent Labs Lab 09/16/15 2354 09/17/15 0615 09/17/15 1042  TROPONINI 0.05* 0.06* 0.06*   CBG:  Recent Labs Lab 09/17/15 1639 09/17/15 2001 09/18/15 0008 09/18/15 0421 09/18/15 0725  GLUCAP 194* 181* 234* 175* 194*     Radiology Studies: Dg Abd 1 View  09/16/2015  CLINICAL DATA:  Acute onset of coffee-ground emesis and mid abdominal pain. Initial encounter. EXAM: ABDOMEN - 1 VIEW COMPARISON:  None. FINDINGS: The visualized bowel gas pattern is unremarkable. Scattered air and stool filled loops of colon are seen; no abnormal dilatation of small bowel loops is seen to suggest small bowel obstruction. No free intra-abdominal air is identified, though evaluation for free air is limited on a single supine view. The visualized osseous structures are within normal limits; the sacroiliac joints are unremarkable in appearance. Brachytherapy seeds are partially imaged at the prostate bed. IMPRESSION: Unremarkable bowel gas pattern; no free intra-abdominal air seen. Electronically Signed   By: Garald Balding M.D.   On: 09/16/2015 19:42   Ct Abdomen Pelvis W Contrast  09/16/2015  CLINICAL DATA:  Vomiting coffee ground emesis, epigastric pain EXAM: CT ABDOMEN AND PELVIS WITH CONTRAST TECHNIQUE: Multidetector CT imaging of the abdomen and pelvis was performed using the standard protocol following bolus administration of intravenous contrast. CONTRAST:  113mL ISOVUE-300 IOPAMIDOL (ISOVUE-300) INJECTION 61% COMPARISON:  CT chest 10/04/2014 FINDINGS: Lower chest: Lung bases are unremarkable. Heart size within normal limits. No pericardial  effusion. There is circumferential thickening of distal esophageal wall up to 1.3 cm. Esophagitis or gastroesophageal reflux cannot be excluded. Clinical correlation is necessary. Hepatobiliary: Enhanced liver shows no biliary ductal dilatation. No solid hepatic mass. No calcified gallstones are noted within contracted gallbladder. Pancreas: Enhanced pancreas is unremarkable. Spleen: Enhanced spleen is unremarkable. Adrenals/Urinary Tract: No adrenal gland mass. Enhanced kidneys are symmetrical in size. No hydronephrosis or hydroureter. Delayed renal images shows bilateral renal symmetrical excretion. Bilateral visualized proximal ureter is unremarkable. The urinary bladder is moderate distended. Stomach/Bowel: There is no gastric outlet obstruction. No small bowel obstruction. No thickened or dilated small bowel loops. Normal appendix is noted in axial image 55. There is no pericecal inflammation.  No colonic obstruction. Few diverticula are noted descending colon. No evidence of acute diverticulitis. No distal colonic obstruction. Vascular/Lymphatic: Atherosclerotic plaques and calcifications are noted abdominal aorta. Atherosclerotic calcifications are noted SMA. Atherosclerotic calcifications are noted celiac trunk origin and SMA origin. Atherosclerotic plaques and calcifications are noted bilateral common iliac arteries. 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 Atherosclerotic calcifications are noted bilateral femoral artery. Reproductive: Multiple radiation seeds are noted in prostate gland region. No pelvic mass is noted. No pelvic adenopathy. No inguinal adenopathy. Other: There is no evidence of ascites or free abdominal air. Musculoskeletal: No destructive bony lesions are noted. Sagittal images of the spine shows degenerative changes lower thoracic spine. Mild degenerative changes lumbar spine. No  destructive bony lesions are noted within pelvis. Mild degenerative changes bilateral SI joints. IMPRESSION: 1. There is significant thickening of distal esophageal wall measures up to 1.3 cm thickness. Esophagitis or gastroesophageal reflux cannot be excluded. Clinical correlation is necessary. 2. No small bowel obstruction. 3. Normal appendix.  No pericecal inflammation. 4. No hydronephrosis or hydroureter. 5. Atherosclerotic vascular calcifications are noted. No aortic aneurysm. 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 Right common iliac artery measures 2.3 cm in diameter. Left common iliac artery measures 2.2 cm in diameter. 6. Radiation seeds are noted in prostate gland region. 7. Degenerative changes thoracolumbar spine. Electronically Signed   By: Lahoma Crocker M.D.   On: 09/16/2015 21:34        Scheduled Meds: . sodium chloride   Intravenous Once  . insulin aspart  0-9 Units Subcutaneous Q4H  . sodium chloride flush  3 mL Intravenous Q12H  . zolpidem  5 mg Oral Once   Continuous Infusions: . pantoprozole (PROTONIX) infusion 8 mg/hr (09/18/15 0249)        Time spent: 25 minutes    Gilles Chiquito, MD [facility] Pager [phone]  If 7PM-7AM, please contact night-coverage [email or web address] Password [facility] 09/18/2015, 9:29 AM

## 2015-09-18 NOTE — Consult Note (Signed)
[facility] Gastroenterology Consultation Note  Referring Provider: Dr. Gilles Chiquito [facility]) Primary Care Physician:  [name], NP  Reason for Consultation:  hematemesis  HPI: Thomas Sosa is a 60 y.o. male whom we've been asked to see for hematemesis.  Has history of stroke about 6 months ago and is on warfarin.  Patient has history of esophageal reflux, and takes omeprazole chronically.  Patient relays history of overall good health until a couple years ago after his wife died.  Since then, he has had escalation of his diabetes, heart problems including defibrillator placement, stroke (as above).  Patient ran out of his prescription medication (including warfarin) a couple weeks ago, and ran out of his omeprazole about one week ago.  A couple days after running out of omeprazole, he had worsening GERD and, then a couple days after that, he had several episodes of forceful black emesis (without preceding clear emesis).  No further hematemesis for the past cou7 Has constant ill-defined epigastric and left upper quadrant pain.  No early satiety or dysphagia.  No history of bowel problems.  No melena or hematochezia.  INR upon arrival 1.1.  No NSAIDs.  No prior GI bleeding.  No prior endoscopy or colonoscopy.   Past Medical History  Diagnosis Date  . Hypertension   . Diabetes mellitus without complication ([place])   . Diabetic neuropathy ([place]) Dx 2011  . Cancer of prostate ([place]) Dx 2005  . Hyperlipidemia   . Coronary artery disease   . Tobacco abuse   . Peripheral arterial disease ([place])   . Stroke ([place])   . 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 Replacement total knee  Lt 2013/ Rt 2005  . Ep implantable device N/A 10/13/2014    Procedure: ICD Implant;  Surgeon: Deboraha Sprang, MD;  Location: [place] [facility];  Service: Cardiovascular;  Laterality: N/A;  . Tee without cardioversion N/A 10/11/2014     Procedure: TRANSESOPHAGEAL ECHOCARDIOGRAM (TEE);  Surgeon: Dorothy Spark, MD;  Location: [place];  Service: Cardiovascular;  Laterality: N/A;    Prior to Admission medications   Medication Sig Start Date End Date Taking? Authorizing Provider  amitriptyline (ELAVIL) 50 MG tablet Take 50 mg by mouth at bedtime.   Yes Historical Provider, MD  aspirin 81 MG EC tablet Take 1 tablet (81 mg total) by mouth daily. 02/10/15  Yes [name], NP  atorvastatin (LIPITOR) 80 MG tablet Take 1 tablet (80 mg total) by mouth daily. 04/27/15  Yes [name], NP  carvedilol (COREG) 12.5 MG tablet Take 1 tablet (12.5 mg total) by mouth 2 (two) times daily. 05/31/15  Yes Deboraha Sprang, MD  gabapentin (NEURONTIN) 600 MG tablet Take 1.5 tablets (900 mg total) by mouth 3 (three) times daily. Must have office visit for refills 07/12/15  Yes Tresa Garter, MD  glipiZIDE (GLUCOTROL XL) 5 MG 24 hr tablet Take 1 tablet (5 mg total) by mouth daily with breakfast. 07/12/15  Yes Tresa Garter, MD  hydrALAZINE (APRESOLINE) 25 MG tablet Take 1 tablet (25 mg total) by mouth 2 (two) times daily. 06/15/15  Yes Luke K Kilroy, PA-C  insulin detemir (LEVEMIR) 100 UNIT/ML injection Inject 0.35 mLs (35 Units total) into the skin 2 (two) times daily. 02/14/15  Yes [name], NP  isosorbide dinitrate (ISORDIL) 20 MG tablet Take 1 tablet (20 mg total) by mouth 2 (  two) times daily. 06/15/15  Yes Luke K Kilroy, PA-C  omeprazole (PRILOSEC) 20 MG capsule Take 1 capsule (20 mg total) by mouth daily. Acid reflux 04/27/15  Yes [name], NP  polyethylene glycol (MIRALAX / GLYCOLAX) packet Take 17 g by mouth 2 (two) times daily. 04/27/15  Yes [name], NP  warfarin (COUMADIN) 5 MG tablet Take 2 tablets (10 mg) everyday except Sunday take 1.5 tablets (7.5 mg) Patient taking differently: Take 7.5-15 mg by mouth daily. Take 2 tablets (10 mg) everyday except Take 3 tablets (15 mg) on Tues and Take 1.5 tablets (7.5 mg)  on Sunday 07/12/15  Yes Olugbemiga E Doreene Burke, MD  HYDROcodone-acetaminophen (NORCO/VICODIN) 5-325 MG tablet Take 1-2 tablets by mouth every 6 (six) hours as needed for moderate pain. Patient not taking: Reported on 09/16/2015 01/16/15   Fredia Sorrow, MD  insulin aspart (NOVOLOG) 100 UNIT/ML injection Inject 10 Units into the skin 3 (three) times daily before meals. < 400 = 14 units; > 400 = call MD 10/14/14   Elmarie Shiley, MD  losartan (COZAAR) 50 MG tablet Take 50 mg by mouth daily.  09/15/15   Historical Provider, MD  methocarbamol (ROBAXIN) 500 MG tablet Take 1 tablet (500 mg total) by mouth 2 (two) times daily. 09/14/15   Tatyana Kirichenko, PA-C  ONE TOUCH ULTRA TEST test strip TEST four times a day 06/07/14   [name], NP  oxyCODONE-acetaminophen (PERCOCET/ROXICET) 5-325 MG tablet Take 1 tablet by mouth every 8 (eight) hours as needed for moderate pain or severe pain.  01/06/15   Historical Provider, MD  penicillin v potassium (VEETID) 500 MG tablet Take 1 tablet (500 mg total) by mouth 4 (four) times daily. Patient not taking: Reported on 09/16/2015 01/16/15   Fredia Sorrow, MD  traMADol (ULTRAM) 50 MG tablet Take 2 tablets (100 mg total) by mouth every 6 (six) hours as needed for moderate pain. 10/14/14   Elmarie Shiley, MD    Current Facility-Administered Medications  Medication Dose Route Frequency Provider Last Rate Last Dose  . 0.9 %  sodium chloride infusion   Intravenous Once Rondell A Tamala Julian, MD      . albuterol (PROVENTIL) (2.5 MG/3ML) 0.083% nebulizer solution 2.5 mg  2.5 mg Nebulization Q2H PRN Norval Morton, MD      . hydrALAZINE (APRESOLINE) injection 10 mg  10 mg Intravenous Q4H PRN Norval Morton, MD   10 mg at 09/17/15 2036  . insulin aspart (novoLOG) injection 0-9 Units  0-9 Units Subcutaneous Q4H Norval Morton, MD   2 Units at 09/18/15 0801  . morphine 2 MG/ML injection 2 mg  2 mg Intravenous Q2H PRN Norval Morton, MD   2 mg at 09/18/15 0818  . ondansetron  (ZOFRAN) tablet 4 mg  4 mg Oral Q6H PRN Norval Morton, MD       Or  . ondansetron (ZOFRAN) injection 4 mg  4 mg Intravenous Q6H PRN Norval Morton, MD      . pantoprazole (PROTONIX) 80 mg in sodium chloride 0.9 % 250 mL (0.32 mg/mL) infusion  8 mg/hr Intravenous Continuous Daleen Bo, MD 25 mL/hr at 09/18/15 0249 8 mg/hr at 09/18/15 0249  . sodium chloride flush (NS) 0.9 % injection 3 mL  3 mL Intravenous Q12H Norval Morton, MD   3 mL at 09/17/15 0054  . zolpidem (AMBIEN) tablet 5 mg  5 mg Oral Once Jeryl Columbia, NP   5 mg at 09/18/15  [id number]    Allergies as of 09/16/2015 - Review Complete 09/16/2015  Allergen Reaction Noted  . Lisinopril Cough 11/10/2014    Family History  Problem Relation Age of Onset  . Heart Problems Mother   . Heart Problems Father   . Heart attack Brother     Social History   Social History  . Marital Status: Married    Spouse Name: N/A  . Number of Children: 10  . Years of Education: 12   Occupational History  . disability    Social History Main Topics  . Smoking status: Former Smoker    Quit date: 03/26/2014  . Smokeless tobacco: Never Used  . Alcohol Use: No  . Drug Use: No  . Sexual Activity: Not on file   Other Topics Concern  . Not on file   Social History Narrative   Patient drinks about 10 cups of caffeine daily.   Patient is right handed.     Review of Systems: Positive = bold Gen: Denies any fever, chills, rigors, night sweats, anorexia, fatigue, weakness, malaise, involuntary weight loss, and sleep disorder CV: Denies chest pain, angina, palpitations, syncope, orthopnea, PND, peripheral edema, and claudication. Resp: Denies dyspnea, cough, sputum, wheezing, coughing up blood. GI: Described in detail in HPI.    GU : Denies urinary burning, blood in urine, urinary frequency, urinary hesitancy, nocturnal urination, and urinary incontinence. MS: Denies joint pain or swelling.  Denies muscle weakness, cramps, atrophy.   Derm: Denies rash, itching, oral ulcerations, hives, unhealing ulcers.  Psych: Denies depression, anxiety, memory loss, suicidal ideation, hallucinations,  and confusion. Heme: Denies bruising, bleeding, and enlarged lymph nodes. Neuro:  Denies any headaches, dizziness, paresthesias. Endo:  Denies any problems with DM, thyroid, adrenal function.  Physical Exam: Vital signs in last 24 hours: Temp:  [98.1 F (36.7 C)-98.5 F (36.9 C)] 98.1 F (36.7 C) (06/25 0546) Pulse Rate:  [88-93] 93 (06/25 0546) Resp:  [20-22] 20 (06/25 0546) BP: (158-201)/(92-104) 158/104 mmHg (06/25 0546) SpO2:  [98 %-99 %] 98 % (06/25 0546) Last BM Date: 09/13/15 General:   Alert, overweight, well-developed, well-nourished, pleasant and cooperative in NAD Head:  Normocephalic and atraumatic. Eyes:  Sclera clear, no icterus.   Conjunctiva pink. Ears:  Normal auditory acuity. Nose:  No deformity, discharge,  or lesions. Mouth:  No deformity or lesions.  Oropharynx pink & moist. Neck:  Supple; no masses or thyromegaly. Lungs:  Clear throughout to auscultation.   No wheezes, crackles, or rhonchi. No acute distress. Heart:  Regular rate and rhythm; no murmurs, clicks, rubs,  or gallops. Abdomen:  Soft, non-distended, mild epigastric tenderness. No masses, hepatosplenomegaly or hernias noted. Normal bowel sounds, without guarding, and without rebound.     Msk:  Symmetrical without gross deformities. Normal posture. Pulses:  Normal pulses noted. Extremities:  Without clubbing or edema. Neurologic:  Alert and  oriented x4;  Diffusely weak, no clear focal neurologic deficits post-stroke. Skin:  Intact without significant lesions or rashes. Psych:  Alert and cooperative. Normal mood and affect.  Lab Results:  Recent Labs  09/16/15 1832  09/17/15 1042 09/17/15 2307 09/18/15 0516  WBC 7.0  --   --   --  7.7  HGB 15.8  < > 13.7 14.1 13.9  HCT 44.9  < > 38.7* 41.4 40.6  PLT 270  --   --   --  238  < > = values  in this interval not displayed. BMET  Recent Labs  09/16/15 1923 09/17/15 0257 09/18/15  0516  NA 140 138 137  K 3.5 3.6 3.0*  CL 96* 99* 98*  CO2 30 31 30   GLUCOSE 277* 223* 186*  BUN 17 16 12   CREATININE 0.88 0.80 0.72  CALCIUM 9.0 8.6* 8.6*   LFT  Recent Labs  09/16/15 1923  PROT 8.2*  ALBUMIN 3.4*  AST 13*  ALT 11*  ALKPHOS 80  BILITOT 1.3*   PT/INR  Recent Labs  09/16/15 1923  LABPROT 14.5  INR 1.11    Studies/Results: Dg Abd 1 View  09/16/2015  CLINICAL DATA:  Acute onset of coffee-ground emesis and mid abdominal pain. Initial encounter. EXAM: ABDOMEN - 1 VIEW COMPARISON:  None. FINDINGS: The visualized bowel gas pattern is unremarkable. Scattered air and stool filled loops of colon are seen; no abnormal dilatation of small bowel loops is seen to suggest small bowel obstruction. No free intra-abdominal air is identified, though evaluation for free air is limited on a single supine view. The visualized osseous structures are within normal limits; the sacroiliac joints are unremarkable in appearance. Brachytherapy seeds are partially imaged at the prostate bed. IMPRESSION: Unremarkable bowel gas pattern; no free intra-abdominal air seen. Electronically Signed   By: Garald Balding M.D.   On: 09/16/2015 19:42   Ct Abdomen Pelvis W Contrast  09/16/2015  CLINICAL DATA:  Vomiting coffee ground emesis, epigastric pain EXAM: CT ABDOMEN AND PELVIS WITH CONTRAST TECHNIQUE: Multidetector CT imaging of the abdomen and pelvis was performed using the standard protocol following bolus administration of intravenous contrast. CONTRAST:  172mL ISOVUE-300 IOPAMIDOL (ISOVUE-300) INJECTION 61% COMPARISON:  CT chest 10/04/2014 FINDINGS: Lower chest: Lung bases are unremarkable. Heart size within normal limits. No pericardial effusion. There is circumferential thickening of distal esophageal wall up to 1.3 cm. Esophagitis or gastroesophageal reflux cannot be excluded. Clinical correlation is  necessary. Hepatobiliary: Enhanced liver shows no biliary ductal dilatation. No solid hepatic mass. No calcified gallstones are noted within contracted gallbladder. Pancreas: Enhanced pancreas is unremarkable. Spleen: Enhanced spleen is unremarkable. Adrenals/Urinary Tract: No adrenal gland mass. Enhanced kidneys are symmetrical in size. No hydronephrosis or hydroureter. Delayed renal images shows bilateral renal symmetrical excretion. Bilateral visualized proximal ureter is unremarkable. The urinary bladder is moderate distended. Stomach/Bowel: There is no gastric outlet obstruction. No small bowel obstruction. No thickened or dilated small bowel loops. Normal appendix is noted in axial image 55. There is no pericecal inflammation. No colonic obstruction. Few diverticula are noted descending colon. No evidence of acute diverticulitis. No distal colonic obstruction. Vascular/Lymphatic: Atherosclerotic plaques and calcifications are noted abdominal aorta. Atherosclerotic calcifications are noted SMA. Atherosclerotic calcifications are noted celiac trunk origin and SMA origin. Atherosclerotic plaques and calcifications are noted bilateral common iliac arteries. 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 Atherosclerotic calcifications are noted bilateral femoral artery. Reproductive: Multiple radiation seeds are noted in prostate gland region. No pelvic mass is noted. No pelvic adenopathy. No inguinal adenopathy. Other: There is no evidence of ascites or free abdominal air. Musculoskeletal: No destructive bony lesions are noted. Sagittal images of the spine shows degenerative changes lower thoracic spine. Mild degenerative changes lumbar spine. No destructive bony lesions are noted within pelvis. Mild degenerative changes bilateral SI joints. IMPRESSION: 1. There is significant thickening of distal esophageal wall  measures up to 1.3 cm thickness. Esophagitis or gastroesophageal reflux cannot be excluded. Clinical correlation is necessary. 2. No small bowel obstruction. 3. Normal appendix.  No pericecal inflammation. 4. No hydronephrosis or hydroureter. 5. Atherosclerotic vascular calcifications are noted. No aortic aneurysm. 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 Right common iliac artery measures 2.3 cm in diameter. Left common iliac artery measures 2.2 cm in diameter. 6. Radiation seeds are noted in prostate gland region. 7. Degenerative changes thoracolumbar spine. Electronically Signed   By: Lahoma Crocker M.D.   On: 09/16/2015 21:34   Impression:  1.  GERD. 2.  Hematemesis, resolved.  BUN normal.  Not anemic.  No melena or hematochezia.  Suspect from esophagitis. 3.  Stroke, prescribed warfarin (but hasn't taken in a couple weeks, INR in normal range).  Plan:  1.  PPI; can decrease to 40 mg IV q 12 hours. 2.  Clear liquid diet. 3.  EGD tomorrow. 4.  Holding warfarin for now. 5.  Risks (bleeding, infection, bowel perforation that could require surgery, sedation-related changes in cardiopulmonary systems), benefits (identification and possible treatment of source of symptoms, exclusion of certain causes of symptoms), and alternatives (watchful waiting, radiographic imaging studies, empiric medical treatment) of upper endoscopy (EGD) were explained to patient/family in detail and patient wishes to proceed.     Landry Dyke  09/18/2015, 11:06 AM  Pager [phone] If no answer or after 5 PM call [phone]

## 2015-09-18 NOTE — Care Management Obs Status (Signed)
[place] NOTIFICATION   Patient Details  Name: Thomas Sosa MRN: [id number] Date of Birth: [birth year]   Medicare Observation Status Notification Given:  Yes    Erenest Rasher, RN 09/18/2015, 5:58 PM

## 2015-09-19 ENCOUNTER — Encounter (HOSPITAL_COMMUNITY)
Admission: EM | Disposition: A | Discharge status: Home / Self Care | Payer: Self-pay | Source: Home / Self Care | Attending: Internal Medicine

## 2015-09-19 ENCOUNTER — Observation Stay (HOSPITAL_COMMUNITY): Payer: Medicare HMO | Admitting: Anesthesiology

## 2015-09-19 ENCOUNTER — Encounter (HOSPITAL_COMMUNITY): Payer: Self-pay | Admitting: *Deleted

## 2015-09-19 DIAGNOSIS — K922 Gastrointestinal hemorrhage, unspecified: Secondary | ICD-10-CM | POA: Diagnosis not present

## 2015-09-19 DIAGNOSIS — K920 Hematemesis: Secondary | ICD-10-CM | POA: Diagnosis not present

## 2015-09-19 DIAGNOSIS — E118 Type 2 diabetes mellitus with unspecified complications: Secondary | ICD-10-CM | POA: Diagnosis not present

## 2015-09-19 DIAGNOSIS — R110 Nausea: Secondary | ICD-10-CM | POA: Diagnosis not present

## 2015-09-19 HISTORY — PX: ESOPHAGOGASTRODUODENOSCOPY (EGD) WITH PROPOFOL: SHX5813

## 2015-09-19 LAB — GLUCOSE, CAPILLARY
GLUCOSE-CAPILLARY: 176 mg/dL — AB (ref 65–99)
GLUCOSE-CAPILLARY: 260 mg/dL — AB (ref 65–99)
GLUCOSE-CAPILLARY: 263 mg/dL — AB (ref 65–99)
Glucose-Capillary: 161 mg/dL — ABNORMAL HIGH (ref 65–99)
Glucose-Capillary: 191 mg/dL — ABNORMAL HIGH (ref 65–99)
Glucose-Capillary: 241 mg/dL — ABNORMAL HIGH (ref 65–99)
Glucose-Capillary: 267 mg/dL — ABNORMAL HIGH (ref 65–99)
Glucose-Capillary: 206 mg/dL — ABNORMAL HIGH (ref 65–99)

## 2015-09-19 LAB — HEMOGLOBIN AND HEMATOCRIT, BLOOD
HEMATOCRIT: 37.7 % — AB (ref 39.0–52.0)
HEMATOCRIT: 40.3 % (ref 39.0–52.0)
HEMOGLOBIN: 13 g/dL (ref 13.0–17.0)
Hemoglobin: 14.3 g/dL (ref 13.0–17.0)

## 2015-09-19 SURGERY — ESOPHAGOGASTRODUODENOSCOPY (EGD) WITH PROPOFOL
Anesthesia: Monitor Anesthesia Care | Laterality: Left

## 2015-09-19 MED ORDER — LIDOCAINE HCL (CARDIAC) 20 MG/ML IV SOLN
INTRAVENOUS | Status: DC | PRN
  Administered 2015-09-19: 50 mg via INTRAVENOUS

## 2015-09-19 MED ORDER — PROPOFOL 10 MG/ML IV BOLUS
INTRAVENOUS | Status: AC
  Filled 2015-09-19: qty 20

## 2015-09-19 MED ORDER — AMITRIPTYLINE HCL 50 MG PO TABS
50.0000 mg | ORAL_TABLET | Freq: Every day | ORAL | Status: DC
  Administered 2015-09-19 – 2015-09-23 (×5): 50 mg via ORAL
  Filled 2015-09-19 (×3): qty 1
  Filled 2015-09-19: qty 2
  Filled 2015-09-19: qty 1

## 2015-09-19 MED ORDER — LACTATED RINGERS IV SOLN
INTRAVENOUS | Status: DC
  Administered 2015-09-19 (×2): via INTRAVENOUS

## 2015-09-19 MED ORDER — TRAMADOL HCL 50 MG PO TABS
100.0000 mg | ORAL_TABLET | Freq: Four times a day (QID) | ORAL | Status: DC | PRN
  Administered 2015-09-19: 100 mg via ORAL
  Filled 2015-09-19 (×2): qty 2

## 2015-09-19 MED ORDER — LIDOCAINE HCL (CARDIAC) 20 MG/ML IV SOLN
INTRAVENOUS | Status: AC
  Filled 2015-09-19: qty 5

## 2015-09-19 MED ORDER — SODIUM CHLORIDE 0.9 % IV SOLN
INTRAVENOUS | Status: DC
  Administered 2015-09-19: 09:00:00 via INTRAVENOUS

## 2015-09-19 MED ORDER — GABAPENTIN 300 MG PO CAPS
900.0000 mg | ORAL_CAPSULE | Freq: Three times a day (TID) | ORAL | Status: DC
  Administered 2015-09-19 – 2015-09-20 (×4): 900 mg via ORAL
  Filled 2015-09-19 (×4): qty 3

## 2015-09-19 MED ORDER — PROPOFOL 10 MG/ML IV BOLUS
INTRAVENOUS | Status: DC | PRN
  Administered 2015-09-19 (×2): 20 mg via INTRAVENOUS
  Administered 2015-09-19: 40 mg via INTRAVENOUS

## 2015-09-19 SURGICAL SUPPLY — 14 items

## 2015-09-19 NOTE — H&P (View-Only) (Signed)
[facility] [place] Consultation Note  Referring Provider: Dr. Gilles Chiquito [facility]) Primary Care Physician:  [name], NP  Reason for Consultation:  hematemesis  HPI: Thomas Sosa is a 60 y.o. male whom we've been asked to see for hematemesis.  Has history of stroke about 6 months ago and is on warfarin.  Patient has history of esophageal reflux, and takes omeprazole chronically.  Patient relays history of overall good health until a couple years ago after his wife died.  Since then, he has had escalation of his diabetes, heart problems including defibrillator placement, stroke (as above).  Patient ran out of his prescription medication (including warfarin) a couple weeks ago, and ran out of his omeprazole about one week ago.  A couple days after running out of omeprazole, he had worsening GERD and, then a couple days after that, he had several episodes of forceful black emesis (without preceding clear emesis).  No further hematemesis for the past cou7 Has constant ill-defined epigastric and left upper quadrant pain.  No early satiety or dysphagia.  No history of bowel problems.  No melena or hematochezia.  INR upon arrival 1.1.  No NSAIDs.  No prior GI bleeding.  No prior endoscopy or colonoscopy.   Past Medical History  Diagnosis Date  . Hypertension   . Diabetes mellitus without complication ([place])   . Diabetic neuropathy ([place]) Dx 2011  . Cancer of prostate ([place]) Dx 2005  . Hyperlipidemia   . Coronary artery disease   . Tobacco abuse   . Peripheral arterial disease ([place])   . Stroke ([place])   . Hemiparesis and alteration of sensations as late effects of stroke ([place]) 02/28/2015    Past Surgical History  Procedure Laterality Date  . Replacement total knee  Lt 2013/ Rt 2005  . Ep implantable device N/A 10/13/2014    Procedure: ICD Implant;  Surgeon: Deboraha Sprang, MD;  Location: [place] [facility];  Service: Cardiovascular;  Laterality: N/A;  . Tee without cardioversion N/A 10/11/2014     Procedure: TRANSESOPHAGEAL ECHOCARDIOGRAM (TEE);  Surgeon: Dorothy Spark, MD;  Location: [place];  Service: Cardiovascular;  Laterality: N/A;    Prior to Admission medications   Medication Sig Start Date End Date Taking? Authorizing Provider  amitriptyline (ELAVIL) 50 MG tablet Take 50 mg by mouth at bedtime.   Yes Historical Provider, MD  aspirin 81 MG EC tablet Take 1 tablet (81 mg total) by mouth daily. 02/10/15  Yes [name], NP  atorvastatin (LIPITOR) 80 MG tablet Take 1 tablet (80 mg total) by mouth daily. 04/27/15  Yes [name], NP  carvedilol (COREG) 12.5 MG tablet Take 1 tablet (12.5 mg total) by mouth 2 (two) times daily. 05/31/15  Yes Deboraha Sprang, MD  gabapentin (NEURONTIN) 600 MG tablet Take 1.5 tablets (900 mg total) by mouth 3 (three) times daily. Must have office visit for refills 07/12/15  Yes Tresa Garter, MD  glipiZIDE (GLUCOTROL XL) 5 MG 24 hr tablet Take 1 tablet (5 mg total) by mouth daily with breakfast. 07/12/15  Yes Tresa Garter, MD  hydrALAZINE (APRESOLINE) 25 MG tablet Take 1 tablet (25 mg total) by mouth 2 (two) times daily. 06/15/15  Yes Luke K Kilroy, PA-C  insulin detemir (LEVEMIR) 100 UNIT/ML injection Inject 0.35 mLs (35 Units total) into the skin 2 (two) times daily. 02/14/15  Yes [name], NP  isosorbide dinitrate (ISORDIL) 20 MG tablet Take 1 tablet (20 mg total) by mouth 2 (  two) times daily. 06/15/15  Yes Luke K Kilroy, PA-C  omeprazole (PRILOSEC) 20 MG capsule Take 1 capsule (20 mg total) by mouth daily. Acid reflux 04/27/15  Yes [name], NP  polyethylene glycol (MIRALAX / GLYCOLAX) packet Take 17 g by mouth 2 (two) times daily. 04/27/15  Yes [name], NP  warfarin (COUMADIN) 5 MG tablet Take 2 tablets (10 mg) everyday except Sunday take 1.5 tablets (7.5 mg) Patient taking differently: Take 7.5-15 mg by mouth daily. Take 2 tablets (10 mg) everyday except Take 3 tablets (15 mg) on Tues and Take 1.5 tablets (7.5 mg)  on Sunday 07/12/15  Yes Olugbemiga E Doreene Burke, MD  HYDROcodone-acetaminophen (NORCO/VICODIN) 5-325 MG tablet Take 1-2 tablets by mouth every 6 (six) hours as needed for moderate pain. Patient not taking: Reported on 09/16/2015 01/16/15   Fredia Sorrow, MD  insulin aspart (NOVOLOG) 100 UNIT/ML injection Inject 10 Units into the skin 3 (three) times daily before meals. < 400 = 14 units; > 400 = call MD 10/14/14   Elmarie Shiley, MD  losartan (COZAAR) 50 MG tablet Take 50 mg by mouth daily.  09/15/15   Historical Provider, MD  methocarbamol (ROBAXIN) 500 MG tablet Take 1 tablet (500 mg total) by mouth 2 (two) times daily. 09/14/15   Tatyana Kirichenko, PA-C  ONE TOUCH ULTRA TEST test strip TEST four times a day 06/07/14   [name], NP  oxyCODONE-acetaminophen (PERCOCET/ROXICET) 5-325 MG tablet Take 1 tablet by mouth every 8 (eight) hours as needed for moderate pain or severe pain.  01/06/15   Historical Provider, MD  penicillin v potassium (VEETID) 500 MG tablet Take 1 tablet (500 mg total) by mouth 4 (four) times daily. Patient not taking: Reported on 09/16/2015 01/16/15   Fredia Sorrow, MD  traMADol (ULTRAM) 50 MG tablet Take 2 tablets (100 mg total) by mouth every 6 (six) hours as needed for moderate pain. 10/14/14   Elmarie Shiley, MD    Current Facility-Administered Medications  Medication Dose Route Frequency Provider Last Rate Last Dose  . 0.9 %  sodium chloride infusion   Intravenous Once Rondell A Tamala Julian, MD      . albuterol (PROVENTIL) (2.5 MG/3ML) 0.083% nebulizer solution 2.5 mg  2.5 mg Nebulization Q2H PRN Norval Morton, MD      . hydrALAZINE (APRESOLINE) injection 10 mg  10 mg Intravenous Q4H PRN Norval Morton, MD   10 mg at 09/17/15 2036  . insulin aspart (novoLOG) injection 0-9 Units  0-9 Units Subcutaneous Q4H Norval Morton, MD   2 Units at 09/18/15 0801  . morphine 2 MG/ML injection 2 mg  2 mg Intravenous Q2H PRN Norval Morton, MD   2 mg at 09/18/15 0818  . ondansetron  (ZOFRAN) tablet 4 mg  4 mg Oral Q6H PRN Norval Morton, MD       Or  . ondansetron (ZOFRAN) injection 4 mg  4 mg Intravenous Q6H PRN Norval Morton, MD      . pantoprazole (PROTONIX) 80 mg in sodium chloride 0.9 % 250 mL (0.32 mg/mL) infusion  8 mg/hr Intravenous Continuous Daleen Bo, MD 25 mL/hr at 09/18/15 0249 8 mg/hr at 09/18/15 0249  . sodium chloride flush (NS) 0.9 % injection 3 mL  3 mL Intravenous Q12H Norval Morton, MD   3 mL at 09/17/15 0054  . zolpidem (AMBIEN) tablet 5 mg  5 mg Oral Once Jeryl Columbia, NP   5 mg at 09/18/15  [id number]    Allergies as of 09/16/2015 - Review Complete 09/16/2015  Allergen Reaction Noted  . Lisinopril Cough 11/10/2014    Family History  Problem Relation Age of Onset  . Heart Problems Mother   . Heart Problems Father   . Heart attack Brother     Social History   Social History  . Marital Status: Married    Spouse Name: N/A  . Number of Children: 10  . Years of Education: 12   Occupational History  . disability    Social History Main Topics  . Smoking status: Former Smoker    Quit date: 03/26/2014  . Smokeless tobacco: Never Used  . Alcohol Use: No  . Drug Use: No  . Sexual Activity: Not on file   Other Topics Concern  . Not on file   Social History Narrative   Patient drinks about 10 cups of caffeine daily.   Patient is right handed.     Review of Systems: Positive = bold Gen: Denies any fever, chills, rigors, night sweats, anorexia, fatigue, weakness, malaise, involuntary weight loss, and sleep disorder CV: Denies chest pain, angina, palpitations, syncope, orthopnea, PND, peripheral edema, and claudication. Resp: Denies dyspnea, cough, sputum, wheezing, coughing up blood. GI: Described in detail in HPI.    GU : Denies urinary burning, blood in urine, urinary frequency, urinary hesitancy, nocturnal urination, and urinary incontinence. MS: Denies joint pain or swelling.  Denies muscle weakness, cramps, atrophy.   Derm: Denies rash, itching, oral ulcerations, hives, unhealing ulcers.  Psych: Denies depression, anxiety, memory loss, suicidal ideation, hallucinations,  and confusion. Heme: Denies bruising, bleeding, and enlarged lymph nodes. Neuro:  Denies any headaches, dizziness, paresthesias. Endo:  Denies any problems with DM, thyroid, adrenal function.  Physical Exam: Vital signs in last 24 hours: Temp:  [98.1 F (36.7 C)-98.5 F (36.9 C)] 98.1 F (36.7 C) (06/25 0546) Pulse Rate:  [88-93] 93 (06/25 0546) Resp:  [20-22] 20 (06/25 0546) BP: (158-201)/(92-104) 158/104 mmHg (06/25 0546) SpO2:  [98 %-99 %] 98 % (06/25 0546) Last BM Date: 09/13/15 General:   Alert, overweight, well-developed, well-nourished, pleasant and cooperative in NAD Head:  Normocephalic and atraumatic. Eyes:  Sclera clear, no icterus.   Conjunctiva pink. Ears:  Normal auditory acuity. Nose:  No deformity, discharge,  or lesions. Mouth:  No deformity or lesions.  Oropharynx pink & moist. Neck:  Supple; no masses or thyromegaly. Lungs:  Clear throughout to auscultation.   No wheezes, crackles, or rhonchi. No acute distress. Heart:  Regular rate and rhythm; no murmurs, clicks, rubs,  or gallops. Abdomen:  Soft, non-distended, mild epigastric tenderness. No masses, hepatosplenomegaly or hernias noted. Normal bowel sounds, without guarding, and without rebound.     Msk:  Symmetrical without gross deformities. Normal posture. Pulses:  Normal pulses noted. Extremities:  Without clubbing or edema. Neurologic:  Alert and  oriented x4;  Diffusely weak, no clear focal neurologic deficits post-stroke. Skin:  Intact without significant lesions or rashes. Psych:  Alert and cooperative. Normal mood and affect.  Lab Results:  Recent Labs  09/16/15 1832  09/17/15 1042 09/17/15 2307 09/18/15 0516  WBC 7.0  --   --   --  7.7  HGB 15.8  < > 13.7 14.1 13.9  HCT 44.9  < > 38.7* 41.4 40.6  PLT 270  --   --   --  238  < > = values  in this interval not displayed. BMET  Recent Labs  09/16/15 1923 09/17/15 0257 09/18/15  0516  NA 140 138 137  K 3.5 3.6 3.0*  CL 96* 99* 98*  CO2 30 31 30   GLUCOSE 277* 223* 186*  BUN 17 16 12   CREATININE 0.88 0.80 0.72  CALCIUM 9.0 8.6* 8.6*   LFT  Recent Labs  09/16/15 1923  PROT 8.2*  ALBUMIN 3.4*  AST 13*  ALT 11*  ALKPHOS 80  BILITOT 1.3*   PT/INR  Recent Labs  09/16/15 1923  LABPROT 14.5  INR 1.11    Studies/Results: Dg Abd 1 View  09/16/2015  CLINICAL DATA:  Acute onset of coffee-ground emesis and mid abdominal pain. Initial encounter. EXAM: ABDOMEN - 1 VIEW COMPARISON:  None. FINDINGS: The visualized bowel gas pattern is unremarkable. Scattered air and stool filled loops of colon are seen; no abnormal dilatation of small bowel loops is seen to suggest small bowel obstruction. No free intra-abdominal air is identified, though evaluation for free air is limited on a single supine view. The visualized osseous structures are within normal limits; the sacroiliac joints are unremarkable in appearance. Brachytherapy seeds are partially imaged at the prostate bed. IMPRESSION: Unremarkable bowel gas pattern; no free intra-abdominal air seen. Electronically Signed   By: Garald Balding M.D.   On: 09/16/2015 19:42   Ct Abdomen Pelvis W Contrast  09/16/2015  CLINICAL DATA:  Vomiting coffee ground emesis, epigastric pain EXAM: CT ABDOMEN AND PELVIS WITH CONTRAST TECHNIQUE: Multidetector CT imaging of the abdomen and pelvis was performed using the standard protocol following bolus administration of intravenous contrast. CONTRAST:  153mL ISOVUE-300 IOPAMIDOL (ISOVUE-300) INJECTION 61% COMPARISON:  CT chest 10/04/2014 FINDINGS: Lower chest: Lung bases are unremarkable. Heart size within normal limits. No pericardial effusion. There is circumferential thickening of distal esophageal wall up to 1.3 cm. Esophagitis or gastroesophageal reflux cannot be excluded. Clinical correlation is  necessary. Hepatobiliary: Enhanced liver shows no biliary ductal dilatation. No solid hepatic mass. No calcified gallstones are noted within contracted gallbladder. Pancreas: Enhanced pancreas is unremarkable. Spleen: Enhanced spleen is unremarkable. Adrenals/Urinary Tract: No adrenal gland mass. Enhanced kidneys are symmetrical in size. No hydronephrosis or hydroureter. Delayed renal images shows bilateral renal symmetrical excretion. Bilateral visualized proximal ureter is unremarkable. The urinary bladder is moderate distended. Stomach/Bowel: There is no gastric outlet obstruction. No small bowel obstruction. No thickened or dilated small bowel loops. Normal appendix is noted in axial image 55. There is no pericecal inflammation. No colonic obstruction. Few diverticula are noted descending colon. No evidence of acute diverticulitis. No distal colonic obstruction. Vascular/Lymphatic: Atherosclerotic plaques and calcifications are noted abdominal aorta. Atherosclerotic calcifications are noted SMA. Atherosclerotic calcifications are noted celiac trunk origin and SMA origin. Atherosclerotic plaques and calcifications are noted bilateral common iliac arteries. 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 Atherosclerotic calcifications are noted bilateral femoral artery. Reproductive: Multiple radiation seeds are noted in prostate gland region. No pelvic mass is noted. No pelvic adenopathy. No inguinal adenopathy. Other: There is no evidence of ascites or free abdominal air. Musculoskeletal: No destructive bony lesions are noted. Sagittal images of the spine shows degenerative changes lower thoracic spine. Mild degenerative changes lumbar spine. No destructive bony lesions are noted within pelvis. Mild degenerative changes bilateral SI joints. IMPRESSION: 1. There is significant thickening of distal esophageal wall  measures up to 1.3 cm thickness. Esophagitis or gastroesophageal reflux cannot be excluded. Clinical correlation is necessary. 2. No small bowel obstruction. 3. Normal appendix.  No pericecal inflammation. 4. No hydronephrosis or hydroureter. 5. Atherosclerotic vascular calcifications are noted. No aortic aneurysm. 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 Right common iliac artery measures 2.3 cm in diameter. Left common iliac artery measures 2.2 cm in diameter. 6. Radiation seeds are noted in prostate gland region. 7. Degenerative changes thoracolumbar spine. Electronically Signed   By: Lahoma Crocker M.D.   On: 09/16/2015 21:34   Impression:  1.  GERD. 2.  Hematemesis, resolved.  BUN normal.  Not anemic.  No melena or hematochezia.  Suspect from esophagitis. 3.  Stroke, prescribed warfarin (but hasn't taken in a couple weeks, INR in normal range).  Plan:  1.  PPI; can decrease to 40 mg IV q 12 hours. 2.  Clear liquid diet. 3.  EGD tomorrow. 4.  Holding warfarin for now. 5.  Risks (bleeding, infection, bowel perforation that could require surgery, sedation-related changes in cardiopulmonary systems), benefits (identification and possible treatment of source of symptoms, exclusion of certain causes of symptoms), and alternatives (watchful waiting, radiographic imaging studies, empiric medical treatment) of upper endoscopy (EGD) were explained to patient/family in detail and patient wishes to proceed.     Landry Dyke  09/18/2015, 11:06 AM  Pager [phone] If no answer or after 5 PM call [phone]

## 2015-09-19 NOTE — Interval H&P Note (Signed)
History and Physical Interval Note:  09/19/2015 10:15 AM  Thomas Sosa  has presented today for surgery, with the diagnosis of hematemesis  The various methods of treatment have been discussed with the patient and family. After consideration of risks, benefits and other options for treatment, the patient has consented to  Procedure(s): ESOPHAGOGASTRODUODENOSCOPY (EGD) WITH PROPOFOL (Left) as a surgical intervention .  The patient's history has been reviewed, patient examined, no change in status, stable for surgery.  I have reviewed the patient's chart and labs.  Questions were answered to the patient's satisfaction.     Tangent C.

## 2015-09-19 NOTE — Anesthesia Postprocedure Evaluation (Signed)
Anesthesia Post Note  Patient: Thomas Sosa  Procedure(s) Performed: Procedure(s) (LRB): ESOPHAGOGASTRODUODENOSCOPY (EGD) WITH PROPOFOL (Left)  Patient location during evaluation: Endoscopy Anesthesia Type: MAC Level of consciousness: awake and alert Pain management: pain level controlled Vital Signs Assessment: post-procedure vital signs reviewed and stable Respiratory status: spontaneous breathing, nonlabored ventilation, respiratory function stable and patient connected to nasal cannula oxygen Cardiovascular status: stable and blood pressure returned to baseline Anesthetic complications: no    Last Vitals:  Filed Vitals:   09/19/15 0917 09/19/15 1040  BP: 135/80 128/87  Pulse: 87 82  Temp: 36.7 C 36.5 C  Resp: 11 15    Last Pain:  Filed Vitals:   09/19/15 1047  PainSc: 10-Worst pain ever                 Catalina Gravel

## 2015-09-19 NOTE — Brief Op Note (Signed)
Distal ulcerative esophagitis without active bleeding. See procedure report for complete details. Clear liquid diet and advance as tolerated. PPI PO BID for 2 months and then QD. Avoid NSAIDs. Will sign off. Call us back if questions.

## 2015-09-19 NOTE — Anesthesia Preprocedure Evaluation (Addendum)
Anesthesia Evaluation  Patient identified by MRN, date of birth, ID band Patient awake    Reviewed: Allergy & Precautions, NPO status , Patient's Chart, lab work & pertinent test results, reviewed documented beta blocker date and time   Airway Mallampati: II  TM Distance: >3 FB Neck ROM: Full    Dental  (+) Dental Advisory Given, Poor Dentition, Chipped,    Pulmonary former smoker,    Pulmonary exam normal breath sounds clear to auscultation       Cardiovascular hypertension, Pt. on medications and Pt. on home beta blockers + CAD, + Past MI, + CABG, + Peripheral Vascular Disease and +CHF  Normal cardiovascular exam+ Cardiac Defibrillator  Rhythm:Regular Rate:Normal     Neuro/Psych  Neuromuscular disease CVA, Residual Symptoms    GI/Hepatic Neg liver ROS, GERD  Medicated,hematemesis   Endo/Other  diabetes, Type 2, Insulin DependentObesity   Renal/GU negative Renal ROS     Musculoskeletal negative musculoskeletal ROS (+)   Abdominal   Peds  Hematology  (+) Blood dyscrasia (warfarin), ,   Anesthesia Other Findings Day of surgery medications reviewed with the patient.  Reproductive/Obstetrics                          Anesthesia Physical Anesthesia Plan  ASA: IV  Anesthesia Plan: MAC   Post-op Pain Management:    Induction: Intravenous  Airway Management Planned: Nasal Cannula  Additional Equipment:   Intra-op Plan:   Post-operative Plan:   Informed Consent: I have reviewed the patients History and Physical, chart, labs and discussed the procedure including the risks, benefits and alternatives for the proposed anesthesia with the patient or authorized representative who has indicated his/her understanding and acceptance.   Dental advisory given  Plan Discussed with: CRNA and Anesthesiologist  Anesthesia Plan Comments: (Discussed risks/benefits/alternatives to MAC sedation  including need for ventilatory support, hypotension, need for conversion to general anesthesia.  All patient questions answered.  Patient/guardian wishes to proceed.)       Anesthesia Quick Evaluation

## 2015-09-19 NOTE — Progress Notes (Signed)
PROGRESS NOTE    Thomas Sosa  [id number] DOB: 28-Feb-1956 DOA: 09/16/2015 PCP: [name], NP    Brief Narrative:  As written by Dr. Tamala Julian on 6/23 60 year old male history of hypertension, diabetes, prostate cancer status post radioactive seed implantation, CVA on anticoagulation therapy, presented with complaints of nausea and vomiting with dark emesis. Patient stated he had run out of his medications for proximal one month. He also ran out of his omeprazole proximally 3 days ago. His nausea and vomiting started approximately 2 days ago, he has been unable tolerate any food or drink. Of note, patient has been off of his Coumadin for approximately one month as he was unable to afford any of his medications. Admitted for hematemesis possible GI bleed. GI consulted and pending.  Interim History No further hematemesis.  Patient with persistent heartburn.  EGD done on 6/26 showed erosive esophagitis.    Assessment & Plan:   Hematemesis, possible GI bleed - No further bleeding - Hgb has remained stable - CBC q daily - EGD by GI today, erosive gastritis.  PPI BID for 2 months then qdaily ongoing - Clears and advance slowly - Possible discharge tomorrow.   Essential HTN - BP today 109/59 - continue home medications    DM type 2  - SSI - Restart levemir tonight    Cardiomyopathy, ischemic, CAD - No chest pain or any changes in symptoms - Troponin stable at 0.06 - flat - Repeat for any chest pain  CVA - Has been off coumadin for a while, no neuro deficits - Given esophagitis, will not restart acutely - Consider restart on discharge.   Hypokalemia - K 3.0, persistently, replete with oral therapy today.   Medication Management - patient no longer able to afford medications, will need a less expensive insulin - Consult DM educator for DM medication recommendation - Other medications include--  On $4 already - amitriptyline, glipizide, hydralazine, warfarin  OTC -  aspirin 81mg   Could change atorvastatin to Lovastatin (on $4 list)  Could change isosorbide dinatrate to mononitrate (on $4 list)  Gabapentin - unclear if he could afford - Consider signing up for MAP program at the health department on discharge - maybe SW or CM can assist.     DVT prophylaxis: SCDs while concern for recurrent bleeding Code Status: Full  Family Communication: None at bedside Disposition Plan: EGD tomorrow, pending discharge after that.    Consultants:   GI  Procedures:   EGD 6/26  Antimicrobials:   None    Subjective: Mild burning epigastric pain persists.  Improving.    Objective: Filed Vitals:   09/19/15 1040 09/19/15 1129 09/19/15 1130 09/19/15 1405  BP: 128/87 139/97 139/97 109/59  Pulse: 82 81 81 74  Temp: 97.7 F (36.5 C)  98.3 F (36.8 C) 98.9 F (37.2 C)  TempSrc:   Oral Oral  Resp: 15  18 18   Height:      Weight:      SpO2: 100%  98% 99%    Intake/Output Summary (Last 24 hours) at 09/19/15 1524 Last data filed at 09/19/15 1149  Gross per 24 hour  Intake    300 ml  Output   1200 ml  Net   -900 ml   Filed Weights   09/16/15 1823  Weight: 235 lb (106.595 kg)    Examination:  General exam: Appears calm and comfortable  Eyes: EOMI, anicteric sclerae Respiratory system: Clear to auscultation. Respiratory effort normal. Cardiovascular system: S1 & S2 heard,  RR, NR.  Gastrointestinal system: Abdomen is nondistended, soft and tender to palpation in the epigastrium. Normal bowel sounds heard.  + hiccups on exam.  Central nervous system: Alert and oriented. No focal neurological deficits.  Psychiatry: Judgement and insight appear normal. Mood & affect appropriate.     Data Reviewed:  CBC:  Recent Labs Lab 09/16/15 1832  09/17/15 2307 09/18/15 0516 09/18/15 1149 09/19/15 0022 09/19/15 1236  WBC 7.0  --   --  7.7  --   --   --   HGB 15.8  < > 14.1 13.9 13.8 13.0 14.3  HCT 44.9  < > 41.4 40.6 38.9* 37.7* 40.3  MCV 87.0   --   --  87.3  --   --   --   PLT 270  --   --  238  --   --   --   < > = values in this interval not displayed. Basic Metabolic Panel:  Recent Labs Lab 09/16/15 1923 09/17/15 0257 09/18/15 0516 09/18/15 1746  NA 140 138 137 137  K 3.5 3.6 3.0* 3.0*  CL 96* 99* 98* 100*  CO2 30 31 30 29   GLUCOSE 277* 223* 186* 157*  BUN 17 16 12 12   CREATININE 0.88 0.80 0.72 0.69  CALCIUM 9.0 8.6* 8.6* 8.4*   GFR: Estimated Creatinine Clearance: 123.9 mL/min (by C-G formula based on Cr of 0.69). Liver Function Tests:  Recent Labs Lab 09/16/15 1923  AST 13*  ALT 11*  ALKPHOS 80  BILITOT 1.3*  PROT 8.2*  ALBUMIN 3.4*    Recent Labs Lab 09/16/15 1923  LIPASE 17   Coagulation Profile:  Recent Labs Lab 09/16/15 1923  INR 1.11   Cardiac Enzymes:  Recent Labs Lab 09/16/15 2354 09/17/15 0615 09/17/15 1042  TROPONINI 0.05* 0.06* 0.06*   CBG:  Recent Labs Lab 09/18/15 2023 09/19/15 0007 09/19/15 0446 09/19/15 0805 09/19/15 1157  GLUCAP 250* 260* 161* 191* 176*     Radiology Studies: No results found.      Scheduled Meds: . sodium chloride   Intravenous Once  . amitriptyline  50 mg Oral QHS  . carvedilol  12.5 mg Oral BID  . gabapentin  900 mg Oral TID  . hydrALAZINE  25 mg Oral BID  . insulin aspart  0-9 Units Subcutaneous Q4H  . pantoprazole  40 mg Oral BID AC  . sodium chloride flush  3 mL Intravenous Q12H  . zolpidem  5 mg Oral Once   Continuous Infusions: . lactated ringers 10 mL/hr at 09/19/15 1149        Time spent: 25 minutes    Gilles Chiquito, MD [facility] Pager [phone]  If 7PM-7AM, please contact night-coverage [email or web address] Password [email or web address] 09/19/2015, 3:24 PM

## 2015-09-19 NOTE — Op Note (Signed)
[facility] Patient Name: Thomas Sosa Procedure Date: 09/19/2015 MRN: [id number] Attending MD: Lear Ng , MD Date of Birth: 03-26-1956 CSN: [id number] Age: 60 Admit Type: Inpatient Procedure:                Upper GI endoscopy Indications:              Hematemesis Providers:                Lear Ng, MD, Cleda Daub, RN, Alfonso Patten, Technician, [facility], CRNA Referring MD:              Medicines:                Propofol per Anesthesia, Monitored Anesthesia Care Complications:            No immediate complications. Estimated Blood Loss:     Estimated blood loss: none. Procedure:                Pre-Anesthesia Assessment:                           - Prior to the procedure, a History and Physical                            was performed, and patient medications and                            allergies were reviewed. The patient's tolerance of                            previous anesthesia was also reviewed. The risks                            and benefits of the procedure and the sedation                            options and risks were discussed with the patient.                            All questions were answered, and informed consent                            was obtained. Prior Anticoagulants: The patient has                            taken no previous anticoagulant or antiplatelet                            agents. ASA Grade Assessment: IV - A patient with                            severe systemic disease that is a constant threat  to life. After reviewing the risks and benefits,                            the patient was deemed in satisfactory condition to                            undergo the procedure.                           After obtaining informed consent, the endoscope was                            passed under direct vision. Throughout the      procedure, the patient's blood pressure, pulse, and                            oxygen saturations were monitored continuously. The                            [id number]) scope was introduced through the                            mouth, and advanced to the second part of duodenum.                            The upper GI endoscopy was accomplished without                            difficulty. The patient tolerated the procedure                            well. Scope In: Scope Out: Findings:      The Z-line was regular and was found 42 cm from the incisors.      LA Grade C (one or more mucosal breaks continuous between tops of 2 or       more mucosal folds, less than 75% circumference) esophagitis with no       bleeding was found in the lower third of the esophagus.      The entire examined stomach was normal.      The examined duodenum was normal.      The cardia and gastric fundus were normal on retroflexion. Impression:               - Z-line regular, 42 cm from the incisors.                           - LA Grade C acute and erosive esophagitis.                           - Normal stomach.                           - Normal examined duodenum.                           -  No specimens collected. Moderate Sedation:      N/A- Per Anesthesia Care Recommendation:           - Use Protonix (pantoprazole) 40 mg PO BID.                           - Clear liquid diet.                           - Advance diet as tolerated.                           - No ibuprofen, naproxen, or other non-steroidal                            anti-inflammatory drugs. Procedure Code(s):        --- Professional ---                           [phone], Esophagogastroduodenoscopy, flexible,                            transoral; diagnostic, including collection of                            specimen(s) by brushing or washing, when performed                            (separate procedure) Diagnosis Code(s):         --- Professional ---                           K92.0, Hematemesis                           K20.8, Other esophagitis CPT copyright 2016 [employer]. All rights reserved. The codes documented in this report are preliminary and upon coder review may  be revised to meet current compliance requirements. Lear Ng, MD 09/19/2015 10:39:52 AM This report has been signed electronically. Number of Addenda: 0

## 2015-09-19 NOTE — Progress Notes (Signed)
Spoke with pt concerning home health needs and medication.. Pt has insurance, the policy changed and he is now unable to pt co pay. Insulin $400.00 and can not pay this amount. [employer] and [employer] discount information given to the pt.  Pt may benefit with medications on the $4.00 list.

## 2015-09-19 NOTE — Progress Notes (Signed)
Nutrition Brief Note  Patient identified on the Malnutrition Screening Tool (MST) Report  Patient's diet is now being advanced. Pt has EGD this morning. Pt states feeling hungry and that when he has had a diet he has eaten well. Pt states he has had 22 lb weight loss from drinking less sweetened beverages and walking more PTA.   Wt Readings from Last 15 Encounters:  09/16/15 235 lb (106.595 kg)  09/14/15 270 lb (122.471 kg)  06/15/15 250 lb 8 oz (113.626 kg)  05/31/15 257 lb 12.8 oz (116.937 kg)  03/22/15 235 lb (106.595 kg)  02/28/15 245 lb 8 oz (111.358 kg)  02/23/15 235 lb (106.595 kg)  02/21/15 251 lb 12.8 oz (114.216 kg)  01/24/15 256 lb 6.4 oz (116.302 kg)  01/20/15 257 lb 3.2 oz (116.665 kg)  11/10/14 263 lb 6.4 oz (119.477 kg)  10/20/14 267 lb (121.11 kg)  10/10/14 262 lb 11.2 oz (119.16 kg)  10/04/14 269 lb 8 oz (122.244 kg)  05/12/14 252 lb (114.306 kg)    Body mass index is 31.86 kg/(m^2). Patient meets criteria for obesity based on current BMI.   Current diet order is full liquid, patient is consuming approximately 25-100% of meals at this time. Labs and medications reviewed.   No nutrition interventions warranted at this time. If nutrition issues arise, please consult RD.   Clayton Bibles, MS, RD, LDN Pager: [phone] After Hours Pager: [phone]

## 2015-09-19 NOTE — Transfer of Care (Signed)
Immediate Anesthesia Transfer of Care Note  Patient: Thomas Sosa  Procedure(s) Performed: Procedure(s): ESOPHAGOGASTRODUODENOSCOPY (EGD) WITH PROPOFOL (Left)  Patient Location: PACU and Endoscopy Unit  Anesthesia Type:MAC  Level of Consciousness: awake, alert , oriented and patient cooperative  Airway & Oxygen Therapy: Patient Spontanous Breathing and Patient connected to nasal cannula oxygen  Post-op Assessment: Report given to RN and Post -op Vital signs reviewed and stable  Post vital signs: Reviewed and stable  Last Vitals:  Filed Vitals:   09/19/15 0449 09/19/15 0917  BP: 124/75 135/80  Pulse: 67 87  Temp: 36.8 C 36.7 C  Resp: 20 11    Last Pain:  Filed Vitals:   09/19/15 0918  PainSc: 10-Worst pain ever      Patients Stated Pain Goal: 4 ([id number])  Complications: No apparent anesthesia complications

## 2015-09-20 ENCOUNTER — Ambulatory Visit: Payer: Medicare HMO | Admitting: Cardiology

## 2015-09-20 ENCOUNTER — Encounter (HOSPITAL_COMMUNITY): Payer: Self-pay | Admitting: Gastroenterology

## 2015-09-20 ENCOUNTER — Observation Stay (HOSPITAL_COMMUNITY): Payer: Medicare HMO

## 2015-09-20 ENCOUNTER — Other Ambulatory Visit: Payer: Self-pay

## 2015-09-20 DIAGNOSIS — M6289 Other specified disorders of muscle: Secondary | ICD-10-CM | POA: Diagnosis not present

## 2015-09-20 DIAGNOSIS — I255 Ischemic cardiomyopathy: Secondary | ICD-10-CM | POA: Diagnosis not present

## 2015-09-20 DIAGNOSIS — K922 Gastrointestinal hemorrhage, unspecified: Secondary | ICD-10-CM | POA: Diagnosis not present

## 2015-09-20 LAB — HEMOGLOBIN AND HEMATOCRIT, BLOOD
HCT: 40 % (ref 39.0–52.0)
Hemoglobin: 14.2 g/dL (ref 13.0–17.0)

## 2015-09-20 LAB — BASIC METABOLIC PANEL
ANION GAP: 9 (ref 5–15)
BUN: 15 mg/dL (ref 6–20)
CHLORIDE: 96 mmol/L — AB (ref 101–111)
CO2: 28 mmol/L (ref 22–32)
CREATININE: 0.83 mg/dL (ref 0.61–1.24)
Calcium: 8.5 mg/dL — ABNORMAL LOW (ref 8.9–10.3)
GFR calc non Af Amer: >60 mL/min (ref >60)
Glucose, Bld: 196 mg/dL — ABNORMAL HIGH (ref 65–99)
Potassium: 3.3 mmol/L — ABNORMAL LOW (ref 3.5–5.1)
SODIUM: 133 mmol/L — AB (ref 135–145)

## 2015-09-20 LAB — GLUCOSE, CAPILLARY
GLUCOSE-CAPILLARY: 214 mg/dL — AB (ref 65–99)
GLUCOSE-CAPILLARY: 159 mg/dL — AB (ref 65–99)
GLUCOSE-CAPILLARY: 144 mg/dL — AB (ref 65–99)
GLUCOSE-CAPILLARY: 201 mg/dL — AB (ref 65–99)
GLUCOSE-CAPILLARY: 291 mg/dL — AB (ref 65–99)
Glucose-Capillary: 188 mg/dL — ABNORMAL HIGH (ref 65–99)

## 2015-09-20 LAB — BLOOD GAS, ARTERIAL
ACID-BASE EXCESS: 5.2 mmol/L — AB (ref 0.0–2.0)
BICARBONATE: 30.8 meq/L — AB (ref 20.0–24.0)
Drawn by: 24486
FIO2: 0.21
O2 Saturation: 89.3 %
PATIENT TEMPERATURE: 99.9
PCO2 ART: 52.1 mmHg — AB (ref 35.0–45.0)
PH ART: 7.393 (ref 7.350–7.450)
TCO2: 26.7 mmol/L (ref 0–100)
pO2, Arterial: 61.2 mmHg — ABNORMAL LOW (ref 80.0–100.0)

## 2015-09-20 MED ORDER — SODIUM CHLORIDE 0.9 % IV BOLUS (SEPSIS)
500.0000 mL | Freq: Once | INTRAVENOUS | Status: AC
  Administered 2015-09-20: 500 mL via INTRAVENOUS

## 2015-09-20 MED ORDER — POTASSIUM CHLORIDE 10 MEQ/100ML IV SOLN
INTRAVENOUS | Status: AC
  Filled 2015-09-20: qty 100

## 2015-09-20 MED ORDER — POTASSIUM CHLORIDE 10 MEQ/100ML IV SOLN
10.0000 meq | INTRAVENOUS | Status: AC
  Administered 2015-09-20 (×3): 10 meq via INTRAVENOUS
  Filled 2015-09-20 (×2): qty 100

## 2015-09-20 MED ORDER — FENTANYL CITRATE (PF) 100 MCG/2ML IJ SOLN: 25.0000 ug | INTRAMUSCULAR | Status: DC | PRN

## 2015-09-20 MED ORDER — IOPAMIDOL (ISOVUE-370) INJECTION 76%
100.0000 mL | Freq: Once | INTRAVENOUS | Status: AC | PRN
  Administered 2015-09-20: 100 mL via INTRAVENOUS

## 2015-09-20 MED ORDER — ASPIRIN EC 325 MG PO TBEC
325.0000 mg | DELAYED_RELEASE_TABLET | Freq: Every day | ORAL | Status: DC
  Administered 2015-09-20 – 2015-09-24 (×5): 325 mg via ORAL
  Filled 2015-09-20 (×6): qty 1

## 2015-09-20 MED ORDER — ONDANSETRON HCL 4 MG/2ML IJ SOLN: 4.0000 mg | Freq: Once | INTRAMUSCULAR | Status: DC | PRN

## 2015-09-20 NOTE — Progress Notes (Addendum)
PROGRESS NOTE    Thomas Sosa  [id number] DOB: 30-Jul-1955 DOA: 09/16/2015 PCP: [name], NP    Brief Narrative:  As written by Dr. Tamala Julian on 6/23 60 year old male history of hypertension, diabetes, prostate cancer status post radioactive seed implantation, CVA on anticoagulation therapy, presented with complaints of nausea and vomiting with dark emesis. Patient stated he had run out of his medications for proximal one month. He also ran out of his omeprazole proximally 3 days ago. His nausea and vomiting started approximately 2 days ago, he has been unable tolerate any food or drink. Of note, patient has been off of his Coumadin for approximately one month as he was unable to afford any of his medications. Admitted for hematemesis possible GI bleed. GI consulted and pending.  Interim History No further hematemesis.  Patient with persistent heartburn.  EGD done on 6/26 showed erosive esophagitis.  Today patient is lethargic, left side weakness started overnight. Stroke wotk up.    Assessment & Plan:   Hematemesis, possible GI bleed - No further bleeding - Hgb has remained stable - CBC q daily - EGD by GI today, erosive gastritis.  PPI BID for 2 months then qdaily ongoing -discussed with Dr Michail Sermon ok to resume coumadin, and if needed could use heparin Gttt  Encephalopathy, New left side weakness.  CBG normal.  Give 250 IV bolus.  Stat CT head. Will resume coumadin if ok by neuro.  Has ICD wont be able to get MRI.  Also per nurse patient was restated on gabapentin yesterday , will hold for now.  Discussed with neuro, will transfer to [facility] , they will see patient when he arrives.   CVA,  - Has been off coumadin for a while, due to GI bleed and for procedure.    Essential HTN - BP today 109/59 - hold medications SBP soft     DM type 2  - SSI - Restart levemir tonight    Cardiomyopathy, ischemic, CAD - No chest pain or any changes in symptoms - Troponin  stable at 0.06 - flat - Repeat for any chest pain  Hypokalemia - K 3.0, re[plete IV due to NPO status.   Medication Management - patient no longer able to afford medications, will need a less expensive insulin - Consult DM educator for DM medication recommendation - Other medications include--  On $4 already - amitriptyline, glipizide, hydralazine, warfarin  OTC - aspirin 81mg   Could change atorvastatin to Lovastatin (on $4 list)  Could change isosorbide dinatrate to mononitrate (on $4 list)  Gabapentin - unclear if he could afford - Consider signing up for MAP program at the health department on discharge - maybe SW or CM can assist.     DVT prophylaxis: SCDs while concern for recurrent bleeding Code Status: Full  Family Communication: None at bedside Disposition Plan: To be determine    Consultants:   GI  Procedures:  EGD 6/26Z          -line regular, 42 cm from the incisors.  - LA Grade C acute and erosive esophagitis.  - Normal stomach.  - Normal examined duodenum.   - No specimens collected.  Antimicrobials:   None    Subjective: He is more lethargic today, I was called by nurse to evaluate patient.  Patient was hard to wake up today. He answer some questions, go back to sleep. He report waking up with left side weakness.   Objective: Filed Vitals:   09/19/15 [id number] 09/20/15 [id number]  09/20/15 0900 09/20/15 1010  BP:  97/53 96/56 99/61   Pulse:  68  77  Temp: 99 F (37.2 C) 98.9 F (37.2 C)  99.9 F (37.7 C)  TempSrc: Oral Oral  Oral  Resp:  16  18  Height:      Weight:      SpO2:  96%  99%    Intake/Output Summary (Last 24 hours) at 09/20/15 1013 Last data filed at 09/20/15 0600  Gross per 24 hour  Intake 979.83 ml  Output    600 ml  Net 379.83 ml   Filed Weights   09/16/15 1823  Weight: 106.595 kg (235 lb)    Examination:  General exam: Appears  calm and comfortable  Eyes: EOMI, anicteric sclerae Respiratory system: Clear to auscultation. Respiratory effort normal. Cardiovascular system: S1 & S2 heard, RR, NR.  Gastrointestinal system: Abdomen is nondistended, soft and tender to palpation in the epigastrium. Normal bowel sounds heard.  + hiccups on exam.  Central nervous system: patient lethargic today, wake up answer some questions, fall back to sleep. Left side 4/5 strenghn. Speech clear.   Psychiatry: Judgement and insight appear normal. Mood & affect appropriate.     Data Reviewed:  CBC:  Recent Labs Lab 09/16/15 1832  09/18/15 0516 09/18/15 1149 09/19/15 0022 09/19/15 1236 09/20/15 0028  WBC 7.0  --  7.7  --   --   --   --   HGB 15.8  < > 13.9 13.8 13.0 14.3 14.2  HCT 44.9  < > 40.6 38.9* 37.7* 40.3 40.0  MCV 87.0  --  87.3  --   --   --   --   PLT 270  --  238  --   --   --   --   < > = values in this interval not displayed. Basic Metabolic Panel:  Recent Labs Lab 09/16/15 1923 09/17/15 0257 09/18/15 0516 09/18/15 1746 09/20/15 0028  NA 140 138 137 137 133*  K 3.5 3.6 3.0* 3.0* 3.3*  CL 96* 99* 98* 100* 96*  CO2 30 31 30 29 28   GLUCOSE 277* 223* 186* 157* 196*  BUN 17 16 12 12 15   CREATININE 0.88 0.80 0.72 0.69 0.83  CALCIUM 9.0 8.6* 8.6* 8.4* 8.5*   GFR: Estimated Creatinine Clearance: 119.4 mL/min (by C-G formula based on Cr of 0.83). Liver Function Tests:  Recent Labs Lab 09/16/15 1923  AST 13*  ALT 11*  ALKPHOS 80  BILITOT 1.3*  PROT 8.2*  ALBUMIN 3.4*    Recent Labs Lab 09/16/15 1923  LIPASE 17   Coagulation Profile:  Recent Labs Lab 09/16/15 1923  INR 1.11   Cardiac Enzymes:  Recent Labs Lab 09/16/15 2354 09/17/15 0615 09/17/15 1042  TROPONINI 0.05* 0.06* 0.06*   CBG:  Recent Labs Lab 09/19/15 2021 09/19/15 2328 09/20/15 0432 09/20/15 0733 09/20/15 1004  GLUCAP 263* 206* 214* 159* 144*     Radiology Studies: No results found.      Scheduled  Meds: . sodium chloride   Intravenous Once  . amitriptyline  50 mg Oral QHS  . carvedilol  12.5 mg Oral BID  . gabapentin  900 mg Oral TID  . hydrALAZINE  25 mg Oral BID  . insulin aspart  0-9 Units Subcutaneous Q4H  . pantoprazole  40 mg Oral BID AC  . sodium chloride flush  3 mL Intravenous Q12H  . zolpidem  5 mg Oral Once   Continuous Infusions: . lactated ringers 10 mL/hr  at 09/19/15 1149        Time spent: 25 minutes    Elmarie Shiley, MD [facility] Pager [phone]  If 7PM-7AM, please contact night-coverage [email or web address] Password [facility] 09/20/2015, 10:13 AM

## 2015-09-20 NOTE — Progress Notes (Signed)
Inpatient Diabetes Program Recommendations  AACE/ADA: New Consensus Statement on Inpatient Glycemic Control (2015)  Target Ranges:  Prepandial:   less than 140 mg/dL      Peak postprandial:   less than 180 mg/dL (1-2 hours)      Critically ill patients:  140 - 180 mg/dL   Lab Results  Component Value Date   GLUCAP 159* 09/20/2015   HGBA1C 11.20 01/20/2015   Results for BANJO, MUNDINE (MRN [id number]) as of 09/20/2015 09:20  Ref. Range 09/19/2015 17:36 09/19/2015 20:21 09/19/2015 23:28 09/20/2015 04:32 09/20/2015 07:33  Glucose-Capillary Latest Ref Range: 65-99 mg/dL 267 (H) 263 (H) 206 (H) 214 (H) 159 (H)   Review of Glycemic Control  Diabetes history: DM2 Outpatient Diabetes medications: None - previously on Levemir 35 units bid and Novolog 10 units tidwc Current orders for Inpatient glycemic control: Novolog sensitive Q4H  Inpatient Diabetes Program Recommendations:    HgbA1C to assess glycemic control prior to hospitalization. Change Novolog to moderate tidwc and hs  Spoke with pt at length today regarding his insulin. Pt states he stopped taking Levemir and Novolog because he couldn't afford it. Has meter and small amount of strips at home. Needs prescription for glucose monitoring strips at discharge. Would recommend ReliOn insulin at discharge - $24.88. Novolin R insulin to replace Novolog 10 units tidwc. ReliOn NPH bid. Start at NPH 15 units bid (AM and HS)  Will need f/u with PCP for management of diabetes. RN to allow pt to give his own insulin while in the hospital to check injection technique.  Will continue to follow. Thank you. Lorenda Peck, RD, LDN, CDE Inpatient Diabetes Coordinator [phone]

## 2015-09-20 NOTE — Progress Notes (Signed)
Upon AM assessment patient very lethargic.  Blood pressure lower than his baseline and temp elevated to 99.9.  Patient showing some left-sided weakness that he reports as new onset.  MD made aware.  Will follow out new orders and monitor patient closely.

## 2015-09-20 NOTE — Progress Notes (Signed)
PT Cancellation Note  Patient Details Name: Thomas Sosa MRN: [id number] DOB: 1955/12/08   Cancelled Treatment:    Reason Eval/Treat Not Completed: Medical issues which prohibited therapy (stroke work up)   York Ram E 09/20/2015, 10:37 AM Carmelia Bake, PT, DPT 09/20/2015 Pager: [phone]

## 2015-09-20 NOTE — Progress Notes (Signed)
Swallow evaluation completed by Jeannene Patella, RN.  Per Pam patient able to eat and swallow medications with no difficulty.  Patient exhibiting increased strength on left side and decreased lethargy throughout the day.  Report called to Jacob Moores, RN on 5 W.  All questions answered.  Patient transported to [facility] by [place].

## 2015-09-20 NOTE — Consult Note (Signed)
Admission H&P    Chief Complaint: New onset left-sided weakness.  HPI: Thomas Sosa is an 60 y.o. male history of hypertension, diabetes mellitus, prostate cancer, hyperlipidemia, coronary artery disease stroke and peripheral vascular disease, admitted on 09/16/2015 for hematemesis. Patient was previously treated with Coumadin, but had not taken Coumadin for about 1 month prior to admission. He also had not taken Prilosec for about the same length of time. Neurology consultation was obtained because of complaint of new onset left-sided weakness which occurred overnight. He was last known well at midnight last night. No facial droop is noted. Speech is not changed. He has not been started back on anticoagulation at this point. Gastroenterologist has okayed resumption of anticoagulation, however. CT scan of his head showed no acute intracranial abnormality. CT angiogram shows severe stenosis of distal left vertebral artery and moderately severe startle stenosis of right vertebral artery. Advanced atherosclerotic calcification was noted in the cavernous carotid artery bilaterally with severe stenosis. NIH stroke score at the time of this evaluation was 2.  LSN: 12:00 AM on 09/20/2015 tPA Given: No: Beyond time window for treatment consideration and minimal deficits. mRankin:  Past Medical History  Diagnosis Date  . Hypertension   . Diabetes mellitus without complication ([place])   . Diabetic neuropathy ([place]) Dx 2011  . Cancer of prostate ([place]) Dx 2005  . Hyperlipidemia   . Coronary artery disease   . Tobacco abuse   . Peripheral arterial disease ([place])   . Stroke ([place])   . Hemiparesis and alteration of sensations as late effects of stroke ([place]) 02/28/2015    Past Surgical History  Procedure Laterality Date  . Replacement total knee  Lt 2013/ Rt 2005  . Ep implantable device N/A 10/13/2014    Procedure: ICD Implant;  Surgeon: Deboraha Sprang, MD;  Location: [facility];  Service:  Cardiovascular;  Laterality: N/A;  . Tee without cardioversion N/A 10/11/2014    Procedure: TRANSESOPHAGEAL ECHOCARDIOGRAM (TEE);  Surgeon: Dorothy Spark, MD;  Location: [facility] ENDOSCOPY;  Service: Cardiovascular;  Laterality: N/A;  . Esophagogastroduodenoscopy (egd) with propofol Left 09/19/2015    Procedure: ESOPHAGOGASTRODUODENOSCOPY (EGD) WITH PROPOFOL;  Surgeon: Wilford Corner, MD;  Location: [facility] ENDOSCOPY;  Service: Endoscopy;  Laterality: Left;    Family History  Problem Relation Age of Onset  . Heart Problems Mother   . Heart Problems Father   . Heart attack Brother    Social History:  reports that he quit smoking about 17 months ago. He has never used smokeless tobacco. He reports that he does not drink alcohol or use illicit drugs.  Allergies:  Allergies  Allergen Reactions  . Lisinopril Cough    Medications Prior to Admission  Medication Sig Dispense Refill  . amitriptyline (ELAVIL) 50 MG tablet Take 50 mg by mouth at bedtime.    [name] aspirin 81 MG EC tablet Take 1 tablet (81 mg total) by mouth daily. 30 tablet 0  . atorvastatin (LIPITOR) 80 MG tablet Take 1 tablet (80 mg total) by mouth daily. 30 tablet 4  . carvedilol (COREG) 12.5 MG tablet Take 1 tablet (12.5 mg total) by mouth 2 (two) times daily. 60 tablet 6  . gabapentin (NEURONTIN) 600 MG tablet Take 1.5 tablets (900 mg total) by mouth 3 (three) times daily. Must have office visit for refills 135 tablet 0  . glipiZIDE (GLUCOTROL XL) 5 MG 24 hr tablet Take 1 tablet (5 mg total) by mouth daily with breakfast. 30 tablet 2  . hydrALAZINE (APRESOLINE)  25 MG tablet Take 1 tablet (25 mg total) by mouth 2 (two) times daily. 60 tablet 11  . insulin detemir (LEVEMIR) 100 UNIT/ML injection Inject 0.35 mLs (35 Units total) into the skin 2 (two) times daily. 10 mL 11  . isosorbide dinitrate (ISORDIL) 20 MG tablet Take 1 tablet (20 mg total) by mouth 2 (two) times daily. 60 tablet 11  . omeprazole (PRILOSEC) 20 MG capsule Take 1  capsule (20 mg total) by mouth daily. Acid reflux 30 capsule 4  . polyethylene glycol (MIRALAX / GLYCOLAX) packet Take 17 g by mouth 2 (two) times daily. 14 each 0  . warfarin (COUMADIN) 5 MG tablet Take 2 tablets (10 mg) everyday except Sunday take 1.5 tablets (7.5 mg) (Patient taking differently: Take 7.5-15 mg by mouth daily. Take 2 tablets (10 mg) everyday except Take 3 tablets (15 mg) on Tues and Take 1.5 tablets (7.5 mg) on Sunday) 60 tablet 3  . HYDROcodone-acetaminophen (NORCO/VICODIN) 5-325 MG tablet Take 1-2 tablets by mouth every 6 (six) hours as needed for moderate pain. (Patient not taking: Reported on 09/16/2015) 14 tablet 0  . insulin aspart (NOVOLOG) 100 UNIT/ML injection Inject 10 Units into the skin 3 (three) times daily before meals. < 400 = 14 units; > 400 = call MD 10 mL 11  . losartan (COZAAR) 50 MG tablet Take 50 mg by mouth daily.     . methocarbamol (ROBAXIN) 500 MG tablet Take 1 tablet (500 mg total) by mouth 2 (two) times daily. 20 tablet 0  . ONE TOUCH ULTRA TEST test strip TEST four times a day 100 each 6  . oxyCODONE-acetaminophen (PERCOCET/ROXICET) 5-325 MG tablet Take 1 tablet by mouth every 8 (eight) hours as needed for moderate pain or severe pain.     [name] penicillin v potassium (VEETID) 500 MG tablet Take 1 tablet (500 mg total) by mouth 4 (four) times daily. (Patient not taking: Reported on 09/16/2015) 40 tablet 0  . traMADol (ULTRAM) 50 MG tablet Take 2 tablets (100 mg total) by mouth every 6 (six) hours as needed for moderate pain. 30 tablet 0    ROS: History obtained from chart review and the patient  General ROS: negative for - chills, fatigue, fever, night sweats, weight gain or weight loss Psychological ROS: negative for - behavioral disorder, hallucinations, memory difficulties, mood swings or suicidal ideation Ophthalmic ROS: negative for - blurry vision, double vision, eye pain or loss of vision ENT ROS: negative for - epistaxis, nasal discharge, oral  lesions, sore throat, tinnitus or vertigo Allergy and Immunology ROS: negative for - hives or itchy/watery eyes Hematological and Lymphatic ROS: negative for - bleeding problems, bruising or swollen lymph nodes Endocrine ROS: negative for - galactorrhea, hair pattern changes, polydipsia/polyuria or temperature intolerance Respiratory ROS: negative for - cough, hemoptysis, shortness of breath or wheezing Cardiovascular ROS: negative for - chest pain, dyspnea on exertion, edema or irregular heartbeat Gastrointestinal ROS: Hematemesis at the time of admission Genito-Urinary ROS: negative for - dysuria, hematuria, incontinence or urinary frequency/urgency Musculoskeletal ROS: negative for - joint swelling or muscular weakness Neurological ROS: as noted in HPI Dermatological ROS: negative for rash and skin lesion changes  Physical Examination: Blood pressure 127/74, pulse 78, temperature 98 F (36.7 C), temperature source Oral, resp. rate 18, height 6' (1.829 m), weight 101.8 kg (224 lb 6.9 oz), SpO2 100 %.  HEENT-  Normocephalic, no lesions, without obvious abnormality.  Normal external eye and conjunctiva.  Normal TM's bilaterally.  Normal auditory canals  and external ears. Normal external nose, mucus membranes and septum.  Normal pharynx. Neck supple with no masses, nodes, nodules or enlargement. Cardiovascular - regular rate and rhythm, S1, S2 normal, no murmur, click, rub or gallop Lungs - chest clear, no wheezing, rales, normal symmetric air entry Abdomen - soft, non-tender; bowel sounds normal; no masses,  no organomegaly Extremities - no joint deformities, effusion, or inflammation  Neurologic Examination: Mental Status: Alert, oriented, thought content appropriate.  Speech fluent without evidence of aphasia. Able to follow commands without difficulty. Cranial Nerves: II-Visual fields were normal. III/IV/VI-Pupils were equal and reacted normally to light. Extraocular movements were  full and conjugate.    V/VII-no facial numbness and no facial weakness. VIII-normal. X-normal speech and symmetrical palatal movement. XI: trapezius strength/neck flexion strength normal bilaterally XII-midline tongue extension with normal strength. Motor: Mild drift of left upper and lower extremities; reduced grip strength of left hand compared to the right; motor exam otherwise unremarkable Sensory: Normal throughout. Deep Tendon Reflexes: Absent throughout. Plantars: Mute bilaterally Cerebellar: Normal finger-to-nose testing. Carotid auscultation: Normal  Results for orders placed or performed during the hospital encounter of 09/16/15 (from the past 48 hour(s))  Glucose, capillary     Status: Abnormal   Collection Time: 09/19/15 12:07 AM  Result Value Ref Range   Glucose-Capillary 260 (H) 65 - 99 mg/dL  Hemoglobin and hematocrit, blood     Status: Abnormal   Collection Time: 09/19/15 12:22 AM  Result Value Ref Range   Hemoglobin 13.0 13.0 - 17.0 g/dL   HCT 37.7 (L) 39.0 - 52.0 %  Glucose, capillary     Status: Abnormal   Collection Time: 09/19/15  4:46 AM  Result Value Ref Range   Glucose-Capillary 161 (H) 65 - 99 mg/dL  Glucose, capillary     Status: Abnormal   Collection Time: 09/19/15  8:05 AM  Result Value Ref Range   Glucose-Capillary 191 (H) 65 - 99 mg/dL  Glucose, capillary     Status: Abnormal   Collection Time: 09/19/15 11:57 AM  Result Value Ref Range   Glucose-Capillary 176 (H) 65 - 99 mg/dL  Hemoglobin and hematocrit, blood     Status: None   Collection Time: 09/19/15 12:36 PM  Result Value Ref Range   Hemoglobin 14.3 13.0 - 17.0 g/dL   HCT 40.3 39.0 - 52.0 %  Glucose, capillary     Status: Abnormal   Collection Time: 09/19/15  4:33 PM  Result Value Ref Range   Glucose-Capillary 241 (H) 65 - 99 mg/dL  Glucose, capillary     Status: Abnormal   Collection Time: 09/19/15  5:36 PM  Result Value Ref Range   Glucose-Capillary 267 (H) 65 - 99 mg/dL  Glucose,  capillary     Status: Abnormal   Collection Time: 09/19/15  8:21 PM  Result Value Ref Range   Glucose-Capillary 263 (H) 65 - 99 mg/dL  Glucose, capillary     Status: Abnormal   Collection Time: 09/19/15 11:28 PM  Result Value Ref Range   Glucose-Capillary 206 (H) 65 - 99 mg/dL  Hemoglobin and hematocrit, blood     Status: None   Collection Time: 09/20/15 12:28 AM  Result Value Ref Range   Hemoglobin 14.2 13.0 - 17.0 g/dL   HCT 40.0 39.0 - 80.2 %  Basic metabolic panel     Status: Abnormal   Collection Time: 09/20/15 12:28 AM  Result Value Ref Range   Sodium 133 (L) 135 - 145 mmol/L   Potassium 3.3 (  L) 3.5 - 5.1 mmol/L   Chloride 96 (L) 101 - 111 mmol/L   CO2 28 22 - 32 mmol/L   Glucose, Bld 196 (H) 65 - 99 mg/dL   BUN 15 6 - 20 mg/dL   Creatinine, Ser 0.83 0.61 - 1.24 mg/dL   Calcium 8.5 (L) 8.9 - 10.3 mg/dL   GFR calc non Af Amer >60 >60 mL/min   GFR calc Af Amer >60 >60 mL/min    Comment: (NOTE) The eGFR has been calculated using the CKD EPI equation. This calculation has not been validated in all clinical situations. eGFR's persistently <60 mL/min signify possible Chronic Kidney Disease.    Anion gap 9 5 - 15  Glucose, capillary     Status: Abnormal   Collection Time: 09/20/15  4:32 AM  Result Value Ref Range   Glucose-Capillary 214 (H) 65 - 99 mg/dL  Glucose, capillary     Status: Abnormal   Collection Time: 09/20/15  7:33 AM  Result Value Ref Range   Glucose-Capillary 159 (H) 65 - 99 mg/dL  Glucose, capillary     Status: Abnormal   Collection Time: 09/20/15 10:04 AM  Result Value Ref Range   Glucose-Capillary 144 (H) 65 - 99 mg/dL  Blood gas, arterial     Status: Abnormal   Collection Time: 09/20/15 10:40 AM  Result Value Ref Range   FIO2 0.21    pH, Arterial 7.393 7.350 - 7.450   pCO2 arterial 52.1 (H) 35.0 - 45.0 mmHg   pO2, Arterial 61.2 (L) 80.0 - 100.0 mmHg   Bicarbonate 30.8 (H) 20.0 - 24.0 mEq/L   TCO2 26.7 0 - 100 mmol/L   Acid-Base Excess 5.2 (H)  0.0 - 2.0 mmol/L   O2 Saturation 89.3 %   Patient temperature 99.9    Collection site RIGHT RADIAL    Drawn by [phone]    Sample type ARTERIAL    Allens test (pass/fail) PASS PASS  Glucose, capillary     Status: Abnormal   Collection Time: 09/20/15 11:54 AM  Result Value Ref Range   Glucose-Capillary 188 (H) 65 - 99 mg/dL  Glucose, capillary     Status: Abnormal   Collection Time: 09/20/15  5:10 PM  Result Value Ref Range   Glucose-Capillary 201 (H) 65 - 99 mg/dL   Ct Angio Head W Or Wo Contrast  09/20/2015  CLINICAL DATA:  Left-sided weakness. EXAM: CT ANGIOGRAPHY HEAD AND NECK TECHNIQUE: Multidetector CT imaging of the head and neck was performed using the standard protocol during bolus administration of intravenous contrast. Multiplanar CT image reconstructions and MIPs were obtained to evaluate the vascular anatomy. Carotid stenosis measurements (when applicable) are obtained utilizing NASCET criteria, using the distal internal carotid diameter as the denominator. CONTRAST:  100 mL Isovue 370 IV COMPARISON:  CT head 09/20/2015 FINDINGS: CTA NECK Aortic arch: Significant streak artifact through the chest and lower neck related to left-sided pacemaker and dense contrast in the right subclavian vein contrast injection. This obscures the aortic arch significantly. Proximal great vessels also completely obscured due to artifact. Right carotid system: Atherosclerotic calcification in the right carotid bifurcation without significant stenosis. Left carotid system: Atherosclerotic calcification left carotid bifurcation with mild stenosis estimated 25% diameter stenosis. Left external carotid artery patent. Vertebral arteries:Proximal vertebral arteries obscured by artifact. The left vertebral artery is dominant. Both vertebral arteries are patent to C1 without significant stenosis. Skeleton: Disc degeneration and spondylosis in the cervical spine. No fracture or bone lesion. Poor dentition. Other neck:  Negative for mass or adenopathy in the neck. CTA HEAD Anterior circulation: Advanced atherosclerotic calcification in the cavernous carotid bilaterally with severe stenosis bilaterally. Estimation of luminal stenosis difficult due to extensive vascular calcification. Supraclinoid internal carotid artery widely patent bilaterally. Anterior and middle cerebral arteries patent bilaterally without significant stenosis. Posterior circulation: Severe calcific stenosis distal left vertebral artery. Moderate calcific stenosis distal right vertebral artery. PICA patent bilaterally. Basilar patent without significant stenosis. AICA, superior cerebellar, posterior cerebral arteries patent bilaterally. Mild irregularity in the posterior cerebral artery bilaterally. Venous sinuses: Patent Anatomic variants: No cerebral aneurysm or vascular malformation Delayed phase: Normal enhancement on delayed imaging. Chronic microvascular ischemic changes in the white matter. IMPRESSION: Mild atherosclerotic calcification in the carotid bifurcation bilaterally without significant stenosis Severe stenosis distal left vertebral artery and moderately severe stenosis distal right vertebral artery at the skullbase. Advanced atherosclerotic calcification in the cavernous carotid artery bilaterally with severe stenosis bilaterally. Electronically Signed   By: Franchot Gallo M.D.   On: 09/20/2015 16:24   Ct Head Wo Contrast  09/20/2015  CLINICAL DATA:  Acute encephalopathy. EXAM: CT HEAD WITHOUT CONTRAST TECHNIQUE: Contiguous axial images were obtained from the base of the skull through the vertex without intravenous contrast. COMPARISON:  01/16/2015 FINDINGS: Mild chronic small vessel disease throughout the deep white matter. No acute intracranial abnormality. Specifically, no hemorrhage, hydrocephalus, mass lesion, acute infarction, or significant intracranial injury. No acute calvarial abnormality. Mucosal thickening in the ethmoid air cells.  No visible air-fluid levels. Mastoid air cells are clear. 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 No acute intracranial abnormality. Chronic ethmoid sinusitis. Electronically Signed   By: Rolm Baptise M.D.   On: 09/20/2015 11:39   Ct Angio Neck W Or Wo Contrast  09/20/2015  CLINICAL DATA:  Left-sided weakness. EXAM: CT ANGIOGRAPHY HEAD AND NECK TECHNIQUE: Multidetector CT imaging of the head and neck was performed using the standard protocol during bolus administration of intravenous contrast. Multiplanar CT image reconstructions and MIPs were obtained to evaluate the vascular anatomy. Carotid stenosis measurements (when applicable) are obtained utilizing NASCET criteria, using the distal internal carotid diameter as the denominator. CONTRAST:  100 mL Isovue 370 IV COMPARISON:  CT head 09/20/2015 FINDINGS: CTA NECK Aortic arch: Significant streak artifact through the chest and lower neck related to left-sided pacemaker and dense contrast in the right subclavian vein contrast injection. This obscures the aortic arch significantly. Proximal great vessels also completely obscured due to artifact. Right carotid system: Atherosclerotic calcification in the right carotid bifurcation without significant stenosis. Left carotid system: Atherosclerotic calcification left carotid bifurcation with mild stenosis estimated 25% diameter stenosis. Left external carotid artery patent. Vertebral arteries:Proximal vertebral arteries obscured by artifact. The left vertebral artery is dominant. Both vertebral arteries are patent to C1 without significant stenosis. Skeleton: Disc degeneration and spondylosis in the cervical spine. No fracture or bone lesion. Poor dentition. Other neck: Negative for mass or adenopathy in the neck. CTA HEAD Anterior circulation: Advanced atherosclerotic calcification in the cavernous carotid bilaterally with severe stenosis bilaterally. Estimation of luminal stenosis  difficult due to extensive vascular calcification. Supraclinoid internal carotid artery widely patent bilaterally. Anterior and middle cerebral arteries patent bilaterally without significant stenosis. Posterior circulation: Severe calcific stenosis distal left vertebral artery. Moderate calcific stenosis distal right vertebral artery. PICA patent bilaterally. Basilar patent without significant stenosis. AICA, superior cerebellar, posterior cerebral arteries patent bilaterally. Mild irregularity in the posterior cerebral artery bilaterally. Venous sinuses: Patent Anatomic variants: No cerebral aneurysm or vascular malformation  Delayed phase: Normal enhancement on delayed imaging. Chronic microvascular ischemic changes in the white matter. IMPRESSION: Mild atherosclerotic calcification in the carotid bifurcation bilaterally without significant stenosis Severe stenosis distal left vertebral artery and moderately severe stenosis distal right vertebral artery at the skullbase. Advanced atherosclerotic calcification in the cavernous carotid artery bilaterally with severe stenosis bilaterally. Electronically Signed   By: Franchot Gallo M.D.   On: 09/20/2015 16:24    Assessment: 60 y.o. male with multiple risk factors for stroke as well as history of previous cerebral infarctions with new onset left-sided weakness, likely manifestations of recurrent subcortical right ischemic infarction. Unfortunately this cannot be verified by MRI because patient has an implanted ICD.  Stroke Risk Factors - diabetes mellitus, hyperlipidemia, hypertension and smoking  Plan: 1. HgbA1c, fasting lipid panel 2. PT consult, OT consult 3. Echocardiogram 4. Prophylactic therapy-Anticoagulation: Coumadin 5. Risk factor modification 6. Telemetry monitoring  C.R. Nicole Kindred, MD [facility] [phone]  09/20/2015, 8:26 PM

## 2015-09-21 ENCOUNTER — Observation Stay (HOSPITAL_COMMUNITY): Payer: Medicare HMO

## 2015-09-21 ENCOUNTER — Encounter: Payer: Self-pay | Admitting: *Deleted

## 2015-09-21 DIAGNOSIS — I63011 Cerebral infarction due to thrombosis of right vertebral artery: Secondary | ICD-10-CM | POA: Diagnosis not present

## 2015-09-21 DIAGNOSIS — E1165 Type 2 diabetes mellitus with hyperglycemia: Secondary | ICD-10-CM | POA: Diagnosis present

## 2015-09-21 DIAGNOSIS — I472 Ventricular tachycardia: Secondary | ICD-10-CM | POA: Diagnosis not present

## 2015-09-21 DIAGNOSIS — I159 Secondary hypertension, unspecified: Secondary | ICD-10-CM | POA: Diagnosis present

## 2015-09-21 DIAGNOSIS — E860 Dehydration: Secondary | ICD-10-CM | POA: Diagnosis present

## 2015-09-21 DIAGNOSIS — Z8546 Personal history of malignant neoplasm of prostate: Secondary | ICD-10-CM | POA: Diagnosis not present

## 2015-09-21 DIAGNOSIS — K920 Hematemesis: Secondary | ICD-10-CM | POA: Diagnosis present

## 2015-09-21 DIAGNOSIS — I639 Cerebral infarction, unspecified: Secondary | ICD-10-CM | POA: Clinically undetermined

## 2015-09-21 DIAGNOSIS — I2510 Atherosclerotic heart disease of native coronary artery without angina pectoris: Secondary | ICD-10-CM | POA: Diagnosis present

## 2015-09-21 DIAGNOSIS — Z951 Presence of aortocoronary bypass graft: Secondary | ICD-10-CM | POA: Diagnosis not present

## 2015-09-21 DIAGNOSIS — R29702 NIHSS score 2: Secondary | ICD-10-CM | POA: Diagnosis not present

## 2015-09-21 DIAGNOSIS — Z95810 Presence of automatic (implantable) cardiac defibrillator: Secondary | ICD-10-CM | POA: Diagnosis not present

## 2015-09-21 DIAGNOSIS — R110 Nausea: Secondary | ICD-10-CM

## 2015-09-21 DIAGNOSIS — E118 Type 2 diabetes mellitus with unspecified complications: Secondary | ICD-10-CM

## 2015-09-21 DIAGNOSIS — E876 Hypokalemia: Secondary | ICD-10-CM | POA: Diagnosis not present

## 2015-09-21 DIAGNOSIS — R531 Weakness: Secondary | ICD-10-CM | POA: Diagnosis present

## 2015-09-21 DIAGNOSIS — E1151 Type 2 diabetes mellitus with diabetic peripheral angiopathy without gangrene: Secondary | ICD-10-CM | POA: Diagnosis present

## 2015-09-21 DIAGNOSIS — I255 Ischemic cardiomyopathy: Secondary | ICD-10-CM

## 2015-09-21 DIAGNOSIS — Z87891 Personal history of nicotine dependence: Secondary | ICD-10-CM | POA: Diagnosis not present

## 2015-09-21 DIAGNOSIS — D62 Acute posthemorrhagic anemia: Secondary | ICD-10-CM | POA: Diagnosis present

## 2015-09-21 DIAGNOSIS — Z96659 Presence of unspecified artificial knee joint: Secondary | ICD-10-CM | POA: Diagnosis present

## 2015-09-21 DIAGNOSIS — M6289 Other specified disorders of muscle: Secondary | ICD-10-CM

## 2015-09-21 DIAGNOSIS — G8194 Hemiplegia, unspecified affecting left nondominant side: Secondary | ICD-10-CM | POA: Diagnosis not present

## 2015-09-21 DIAGNOSIS — K922 Gastrointestinal hemorrhage, unspecified: Secondary | ICD-10-CM | POA: Diagnosis not present

## 2015-09-21 DIAGNOSIS — Z7982 Long term (current) use of aspirin: Secondary | ICD-10-CM | POA: Diagnosis not present

## 2015-09-21 DIAGNOSIS — Z794 Long term (current) use of insulin: Secondary | ICD-10-CM | POA: Diagnosis not present

## 2015-09-21 DIAGNOSIS — I5022 Chronic systolic (congestive) heart failure: Secondary | ICD-10-CM | POA: Diagnosis present

## 2015-09-21 DIAGNOSIS — Z9114 Patient's other noncompliance with medication regimen: Secondary | ICD-10-CM | POA: Diagnosis not present

## 2015-09-21 DIAGNOSIS — E785 Hyperlipidemia, unspecified: Secondary | ICD-10-CM | POA: Diagnosis present

## 2015-09-21 DIAGNOSIS — I6789 Other cerebrovascular disease: Secondary | ICD-10-CM | POA: Diagnosis not present

## 2015-09-21 DIAGNOSIS — K2211 Ulcer of esophagus with bleeding: Secondary | ICD-10-CM | POA: Diagnosis present

## 2015-09-21 DIAGNOSIS — K210 Gastro-esophageal reflux disease with esophagitis: Secondary | ICD-10-CM | POA: Diagnosis present

## 2015-09-21 DIAGNOSIS — Z7901 Long term (current) use of anticoagulants: Secondary | ICD-10-CM | POA: Diagnosis not present

## 2015-09-21 LAB — COMPREHENSIVE METABOLIC PANEL
ALK PHOS: 61 U/L (ref 38–126)
ALT: 8 U/L — ABNORMAL LOW (ref 17–63)
ANION GAP: 8 (ref 5–15)
AST: 9 U/L — ABNORMAL LOW (ref 15–41)
Albumin: 2.5 g/dL — ABNORMAL LOW (ref 3.5–5.0)
BUN: 13 mg/dL (ref 6–20)
CALCIUM: 8.3 mg/dL — AB (ref 8.9–10.3)
CO2: 29 mmol/L (ref 22–32)
Chloride: 99 mmol/L — ABNORMAL LOW (ref 101–111)
Creatinine, Ser: 0.94 mg/dL (ref 0.61–1.24)
GFR calc non Af Amer: >60 mL/min (ref >60)
GLUCOSE: 286 mg/dL — AB (ref 65–99)
POTASSIUM: 3.3 mmol/L — AB (ref 3.5–5.1)
SODIUM: 136 mmol/L (ref 135–145)
TOTAL PROTEIN: 6.6 g/dL (ref 6.5–8.1)
Total Bilirubin: 0.6 mg/dL (ref 0.3–1.2)

## 2015-09-21 LAB — CBC
HEMATOCRIT: 35.3 % — AB (ref 39.0–52.0)
HEMOGLOBIN: 11.9 g/dL — AB (ref 13.0–17.0)
MCH: 29.9 pg (ref 26.0–34.0)
MCHC: 33.7 g/dL (ref 30.0–36.0)
MCV: 88.7 fL (ref 78.0–100.0)
Platelets: 209 10*3/uL (ref 150–400)
RBC: 3.98 MIL/uL — AB (ref 4.22–5.81)
RDW: 12.8 % (ref 11.5–15.5)
WBC: 7.4 10*3/uL (ref 4.0–10.5)

## 2015-09-21 LAB — MAGNESIUM: MAGNESIUM: 1.4 mg/dL — AB (ref 1.7–2.4)

## 2015-09-21 LAB — GLUCOSE, CAPILLARY
GLUCOSE-CAPILLARY: 296 mg/dL — AB (ref 65–99)
Glucose-Capillary: 308 mg/dL — ABNORMAL HIGH (ref 65–99)
Glucose-Capillary: 178 mg/dL — ABNORMAL HIGH (ref 65–99)
Glucose-Capillary: 246 mg/dL — ABNORMAL HIGH (ref 65–99)
Glucose-Capillary: 273 mg/dL — ABNORMAL HIGH (ref 65–99)
Glucose-Capillary: 355 mg/dL — ABNORMAL HIGH (ref 65–99)

## 2015-09-21 MED ORDER — INSULIN NPH (HUMAN) (ISOPHANE) 100 UNIT/ML ~~LOC~~ SUSP
15.0000 [IU] | Freq: Two times a day (BID) | SUBCUTANEOUS | Status: DC
  Administered 2015-09-21 (×2): 15 [IU] via SUBCUTANEOUS
  Filled 2015-09-21: qty 10

## 2015-09-21 MED ORDER — POTASSIUM CHLORIDE CRYS ER 20 MEQ PO TBCR
40.0000 meq | EXTENDED_RELEASE_TABLET | Freq: Once | ORAL | Status: AC
  Administered 2015-09-21: 40 meq via ORAL
  Filled 2015-09-21: qty 2

## 2015-09-21 MED ORDER — ATORVASTATIN CALCIUM 80 MG PO TABS
80.0000 mg | ORAL_TABLET | Freq: Every day | ORAL | Status: DC
  Administered 2015-09-21 – 2015-09-23 (×3): 80 mg via ORAL
  Filled 2015-09-21 (×3): qty 1

## 2015-09-21 MED ORDER — SODIUM CHLORIDE 0.9 % IV SOLN
INTRAVENOUS | Status: DC
  Administered 2015-09-21 (×2): via INTRAVENOUS

## 2015-09-21 MED ORDER — STROKE: EARLY STAGES OF RECOVERY BOOK
Freq: Once | Status: AC
  Administered 2015-09-21: 08:00:00
  Filled 2015-09-21: qty 1

## 2015-09-21 MED ORDER — SENNOSIDES-DOCUSATE SODIUM 8.6-50 MG PO TABS: 1.0000 | ORAL_TABLET | Freq: Every evening | ORAL | Status: DC | PRN

## 2015-09-21 MED ORDER — MAGNESIUM SULFATE 4 GM/100ML IV SOLN
4.0000 g | Freq: Once | INTRAVENOUS | Status: AC
  Administered 2015-09-21: 4 g via INTRAVENOUS
  Filled 2015-09-21: qty 100

## 2015-09-21 MED ORDER — INSULIN ASPART 100 UNIT/ML ~~LOC~~ SOLN
0.0000 [IU] | Freq: Three times a day (TID) | SUBCUTANEOUS | Status: DC
  Administered 2015-09-21: 5 [IU] via SUBCUTANEOUS
  Administered 2015-09-21: 8 [IU] via SUBCUTANEOUS
  Administered 2015-09-21: 3 [IU] via SUBCUTANEOUS
  Administered 2015-09-22: 5 [IU] via SUBCUTANEOUS
  Administered 2015-09-22: 2 [IU] via SUBCUTANEOUS
  Administered 2015-09-22 – 2015-09-23 (×2): 3 [IU] via SUBCUTANEOUS
  Administered 2015-09-23: 2 [IU] via SUBCUTANEOUS
  Administered 2015-09-23 – 2015-09-24 (×2): 3 [IU] via SUBCUTANEOUS
  Administered 2015-09-24: 2 [IU] via SUBCUTANEOUS

## 2015-09-21 NOTE — Progress Notes (Signed)
   09/21/15 0800  SLP G-Codes **NOT FOR INPATIENT CLASS**  Functional Assessment Tool Used (clinical judgement)  Functional Limitations Swallowing  Swallow Current Status [id number]) [place]  Swallow Goal Status [id number]) [facility]  Swallow Discharge Status [id number]) [place]  SLP Evaluations  $ SLP Speech Visit 1 Procedure  SLP Evaluations  $BSS Swallow 1 Procedure

## 2015-09-21 NOTE — Evaluation (Signed)
Clinical/Bedside Swallow Evaluation Patient Details  Name: Thomas Sosa MRN: [id number] Date of Birth: [birth year]  Today's Date: 09/21/2015 Time: SLP Start Time (ACUTE ONLY): 0913 SLP Stop Time (ACUTE ONLY): 0921 SLP Time Calculation (min) (ACUTE ONLY): 8 min  Past Medical History:  Past Medical History  Diagnosis Date  . Hypertension   . Diabetes mellitus without complication ([place])   . Diabetic neuropathy ([place]) Dx 2011  . Cancer of prostate ([place]) Dx 2005  . Hyperlipidemia   . Coronary artery disease   . Tobacco abuse   . Peripheral arterial disease ([place])   . Stroke ([place])   . Hemiparesis and alteration of sensations as late effects of stroke ([place]) 02/28/2015   Past Surgical History:  Past Surgical History  Procedure Laterality Date  . Replacement total knee  Lt 2013/ Rt 2005  . Ep implantable device N/A 10/13/2014    Procedure: ICD Implant;  Surgeon: Deboraha Sprang, MD;  Location: [facility];  Service: Cardiovascular;  Laterality: N/A;  . Tee without cardioversion N/A 10/11/2014    Procedure: TRANSESOPHAGEAL ECHOCARDIOGRAM (TEE);  Surgeon: Dorothy Spark, MD;  Location: [facility] ENDOSCOPY;  Service: Cardiovascular;  Laterality: N/A;  . Esophagogastroduodenoscopy (egd) with propofol Left 09/19/2015    Procedure: ESOPHAGOGASTRODUODENOSCOPY (EGD) WITH PROPOFOL;  Surgeon: Wilford Corner, MD;  Location: [facility] ENDOSCOPY;  Service: Endoscopy;  Laterality: Left;   HPI:  60 y.o. male with multiple risk factors for stroke as well as history of previous cerebral infarctions with new onset left-sided weakness, likely manifestations of recurrent subcortical right ischemic infarction, per nuerologist. Unfortunately this cannot be verified by MRI because patient has an implanted ICD. Pt also presents with hematemesis; EGD done on 6/26 showed erosive esophagitis. He was off his omeprazole for one month due to inability to pay for medications.    Assessment / Plan / Recommendation Clinical  Impression  Pt demonstrates mild signs of a probable primary esophageal dysphagia consistent with history. Pt tolerated 12 oz of water with only one cough following a belch. Otherwise swallow function appeared timely and strong. No difficulty observed with transit of solids over 100% of intake. Recent CXR shows no concerning findings and pt has been on a diet since being cleared by GI for intake. Recommend pt resume a regular diet and thin liquids. No SLP f/u needed.     Aspiration Risk  Mild aspiration risk    Diet Recommendation Regular;Thin liquid   Liquid Administration via: Cup;Straw Medication Administration: Whole meds with liquid Supervision: Patient able to self feed Postural Changes: Seated upright at 90 degrees;Remain upright for at least 30 minutes after po intake    Other  Recommendations     Follow up Recommendations       Frequency and Duration            Prognosis        Swallow Study   General HPI: 60 y.o. male with multiple risk factors for stroke as well as history of previous cerebral infarctions with new onset left-sided weakness, likely manifestations of recurrent subcortical right ischemic infarction, per nuerologist. Unfortunately this cannot be verified by MRI because patient has an implanted ICD. Pt also presents with hematemesis; EGD done on 6/26 showed erosive esophagitis. He was off his omeprazole for one month due to inability to pay for medications.  Type of Study: Bedside Swallow Evaluation Previous Swallow Assessment: none Diet Prior to this Study: NPO Temperature Spikes Noted: No Respiratory Status: Nasal cannula History of Recent Intubation: No Behavior/Cognition:  Alert;Cooperative;Pleasant mood Oral Cavity Assessment: Within Functional Limits Oral Care Completed by SLP: No Oral Cavity - Dentition: Missing dentition Vision: Functional for self-feeding Self-Feeding Abilities: Able to feed self Patient Positioning: Upright in bed Baseline  Vocal Quality: Low vocal intensity Volitional Cough: Strong Volitional Swallow: Able to elicit    Oral/Motor/Sensory Function Overall Oral Motor/Sensory Function: Within functional limits   Ice Chips     Thin Liquid Thin Liquid: Within functional limits    Nectar Thick Nectar Thick Liquid: Not tested   Honey Thick Honey Thick Liquid: Not tested   Puree Puree: Within functional limits   Solid   GO   Solid: Within functional limits       [facility], MA CCC-SLP [id number]  Jisel Fleet, Katherene Ponto 09/21/2015,9:38 AM

## 2015-09-21 NOTE — Care Management Note (Signed)
Case Management Note  Patient Details  Name: Thomas Sosa MRN: [id number] Date of Birth: 12-16-1955  Subjective/Objective:                 Patient admitted from home, transferred from [facility] for CVA rule out, L side weakness. States he gets his coumadin from [facility], it is there waiting for him to pick up, but he does not have the $4 to get it. Patient insured through [employer]. CM unable to provide med at lower cost.   Action/Plan:  PT eval pending.  Expected Discharge Date:                  Expected Discharge Plan:  [place]  In-House Referral:     Discharge planning Services  CM Consult  Post Acute Care Choice:    Choice offered to:     DME Arranged:    DME Agency:     HH Arranged:    [place] Agency:     Status of Service:  In process, will continue to follow  If discussed at Long Length of Stay Meetings, dates discussed:    Additional Comments:  Carles Collet, RN 09/21/2015, 12:03 PM

## 2015-09-21 NOTE — Progress Notes (Signed)
PROGRESS NOTE    Thomas Sosa  [id number] DOB: July 03, 1955 DOA: 09/16/2015 PCP: [name], NP    Brief Narrative:  As written by Dr. Tamala Julian on 6/23 60 year old male history of hypertension, diabetes, prostate cancer status post radioactive seed implantation, CVA on anticoagulation therapy, presented with complaints of nausea and vomiting with dark emesis. Patient stated he had run out of his medications for proximal one month. He also ran out of his omeprazole proximally 3 days ago. His nausea and vomiting started approximately 2 days ago, he has been unable tolerate any food or drink. Of note, patient has been off of his Coumadin for approximately one month as he was unable to afford any of his medications. Admitted for hematemesis possible GI bleed. GI consulted and pending.  Interim History No further hematemesis. Patient with persistent heartburn. EGD done on 6/26 showed erosive esophagitis.  Today patient is lethargic, left side weakness started overnight. Stroke wotk up.    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 Patient subsequently underwent upper endoscopy that showed distal erosive esophagitis. Patient was placed on a PPI and serial CBCs done. Patient's hemoglobin remained stable. Was recommended that patient be on a PPI twice daily for 2 months and then daily on afterwards.  #2 new left-sided weakness/possible new right brain stroke not seen on CT Consent for an acute CVA. Patient with prior history of stroke. Patient had noted to be off his Coumadin prior to admission. CT head which  was done was unremarkable. Patient status post pacemaker placement and a such MRI on obtainable. 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 Fasting lipid panel done had a LDL of 100. Hemoglobin A1c pending. 2-D echo pending. Patient was on aspirin prior to admission had been on Coumadin but discontinued one month prior to admission due to financial issues. Patient has been seen in consultation by neurology who felt that patient did not need anticoagulation at this time and to continue patient on aspirin for secondary stroke prevention. PT/OT/ST. Neurology following and appreciate input and recommendations.  #3 diabetes mellitus type 2 Hemoglobin A1c was 11.20 on 01/20/2015. Hemoglobin A1c pending. CBGs have ranged from 178- 308. Due to financial issues will start patient on NPH 15 units twice a day. Sliding scale insulin.  #4 ischemic cardiomyopathy/coronary artery disease status post CABG/chronic systolic CHF Currently stable. Patient euvolemic on examination. Patient denies any chest pain. Continue aspirin, Lipitor, Coreg, hydralazine.  #5 hyperlipidemia Continue statin.  #6 hypokalemia/hypomagnesemia Replete.    DVT prophylaxis: SCDs Code Status: Full Family Communication: Updated patient. No family at bedside. Disposition Plan: Pending PT evaluation.   Consultants:   Gastroenterology: Dr. Paulita Fujita 09/18/2015  Neurology: Dr. Nicole Kindred 09/20/2015  Procedures:   CT abdomen and pelvis 09/16/2015  CT head 09/20/2015, 09/21/2015   CT angiogram head and neck 09/20/2015  Chest x-ray 09/21/2015  Abdominal x-ray 09/16/2015  Upper endoscopy 09/19/2015 per Dr. Michail Sermon  Antimicrobials:   None   Subjective: Patient denies any further hematemesis. No chest pain. No shortness of breath. Patient still  with complaints of left-sided weakness.  Objective: Filed Vitals:    09/20/15 1900 09/20/15 1932 09/20/15 2104 09/21/15 0545  BP:  127/74 151/80 115/62  Pulse:  78 80 80  Temp:  98 F (36.7 C) 98.8 F (37.1 C) 99.6 F (37.6 C)  TempSrc:      Resp:  18 17 17   Height: 6' (1.829 m)     Weight: 101.8 kg (224 lb 6.9 oz)     SpO2:  100% 100% 95%    Intake/Output Summary (Last 24 hours) at 09/21/15 1058 Last data filed at 09/21/15 0715  Gross per 24 hour  Intake      0 ml  Output   1575 ml  Net  -1575 ml   Filed Weights   09/16/15 1823 09/20/15 1900  Weight: 106.595 kg (235 lb) 101.8 kg (224 lb 6.9 oz)    Examination:  General exam: Appears calm and comfortable  Respiratory system: Clear to auscultation. Respiratory effort normal. Cardiovascular system: S1 & S2 heard, RRR. No JVD, murmurs, rubs, gallops or clicks. No pedal edema. Gastrointestinal system: Abdomen is nondistended, soft and nontender. No organomegaly or masses felt. Normal bowel sounds heard. Central nervous system: Alert and oriented. No focal neurological deficits. Extremities: 4/5 left upper extremity and 3-4/5 left lower extremity strength. 5/5 right upper extremity strength and right lower extremity strength. Skin: No rashes, lesions or ulcers Psychiatry: Judgement and insight appear normal. Mood & affect appropriate.     Data Reviewed: I have personally reviewed following labs and imaging studies  CBC:  Recent Labs Lab 09/16/15 1832  09/18/15 0516 09/18/15 1149 09/19/15 0022 09/19/15 1236 09/20/15 0028  WBC 7.0  --  7.7  --   --   --   --   HGB 15.8  < > 13.9 13.8 13.0 14.3 14.2  HCT 44.9  < > 40.6 38.9* 37.7* 40.3 40.0  MCV 87.0  --  87.3  --   --   --   --   PLT 270  --  238  --   --   --   --   < > = values in this interval not displayed. Basic Metabolic Panel:  Recent Labs Lab 09/16/15 1923 09/17/15 0257 09/18/15 0516 09/18/15 1746 09/20/15 0028  NA 140 138 137 137 133*  K 3.5 3.6 3.0* 3.0* 3.3*  CL 96* 99* 98* 100* 96*  CO2 30 31 30 29 28   GLUCOSE  277* 223* 186* 157* 196*  BUN 17 16 12 12 15   CREATININE 0.88 0.80 0.72 0.69 0.83  CALCIUM 9.0 8.6* 8.6* 8.4* 8.5*   GFR: Estimated Creatinine Clearance: 116.9 mL/min (by C-G formula based on Cr of 0.83). Liver Function Tests:  Recent Labs Lab 09/16/15 1923  AST 13*  ALT 11*  ALKPHOS 80  BILITOT 1.3*  PROT 8.2*  ALBUMIN 3.4*    Recent Labs Lab 09/16/15 1923  LIPASE 17   No results for input(s): AMMONIA in the last 168 hours. Coagulation Profile:  Recent Labs Lab 09/16/15 1923  INR 1.11   Cardiac Enzymes:  Recent Labs Lab 09/16/15 2354 09/17/15 0615 09/17/15 1042  TROPONINI 0.05* 0.06* 0.06*   BNP (last 3 results) No results for input(s): PROBNP in the last 8760 hours. HbA1C: No results for input(s): HGBA1C in the last 72 hours. CBG:  Recent Labs Lab 09/20/15 1710 09/20/15 2101 09/21/15 0108 09/21/15 0407 09/21/15 0809  GLUCAP 201* 291* 296* 308* 178*   Lipid Profile: No results for input(s): CHOL,  HDL, LDLCALC, TRIG, CHOLHDL, LDLDIRECT in the last 72 hours. Thyroid Function Tests: No results for input(s): TSH, T4TOTAL, FREET4, T3FREE, THYROIDAB in the last 72 hours. Anemia Panel: No results for input(s): VITAMINB12, FOLATE, FERRITIN, TIBC, IRON, RETICCTPCT in the last 72 hours. Sepsis Labs: No results for input(s): PROCALCITON, LATICACIDVEN in the last 168 hours.  No results found for this or any previous visit (from the past 240 hour(s)).       Radiology Studies: Ct Angio Head W Or Wo Contrast  09/20/2015  CLINICAL DATA:  Left-sided weakness. EXAM: CT ANGIOGRAPHY HEAD AND NECK TECHNIQUE: Multidetector CT imaging of the head and neck was performed using the standard protocol during bolus administration of intravenous contrast. Multiplanar CT image reconstructions and MIPs were obtained to evaluate the vascular anatomy. Carotid stenosis measurements (when applicable) are obtained utilizing NASCET criteria, using the distal internal carotid  diameter as the denominator. CONTRAST:  100 mL Isovue 370 IV COMPARISON:  CT head 09/20/2015 FINDINGS: CTA NECK Aortic arch: Significant streak artifact through the chest and lower neck related to left-sided pacemaker and dense contrast in the right subclavian vein contrast injection. This obscures the aortic arch significantly. Proximal great vessels also completely obscured due to artifact. Right carotid system: Atherosclerotic calcification in the right carotid bifurcation without significant stenosis. Left carotid system: Atherosclerotic calcification left carotid bifurcation with mild stenosis estimated 25% diameter stenosis. Left external carotid artery patent. Vertebral arteries:Proximal vertebral arteries obscured by artifact. The left vertebral artery is dominant. Both vertebral arteries are patent to C1 without significant stenosis. Skeleton: Disc degeneration and spondylosis in the cervical spine. No fracture or bone lesion. Poor dentition. Other neck: Negative for mass or adenopathy in the neck. CTA HEAD Anterior circulation: Advanced atherosclerotic calcification in the cavernous carotid bilaterally with severe stenosis bilaterally. Estimation of luminal stenosis difficult due to extensive vascular calcification. Supraclinoid internal carotid artery widely patent bilaterally. Anterior and middle cerebral arteries patent bilaterally without significant stenosis. Posterior circulation: Severe calcific stenosis distal left vertebral artery. Moderate calcific stenosis distal right vertebral artery. PICA patent bilaterally. Basilar patent without significant stenosis. AICA, superior cerebellar, posterior cerebral arteries patent bilaterally. Mild irregularity in the posterior cerebral artery bilaterally. Venous sinuses: Patent Anatomic variants: No cerebral aneurysm or vascular malformation Delayed phase: Normal enhancement on delayed imaging. Chronic microvascular ischemic changes in the white matter.  IMPRESSION: Mild atherosclerotic calcification in the carotid bifurcation bilaterally without significant stenosis Severe stenosis distal left vertebral artery and moderately severe stenosis distal right vertebral artery at the skullbase. Advanced atherosclerotic calcification in the cavernous carotid artery bilaterally with severe stenosis bilaterally. Electronically Signed   By: Franchot Gallo M.D.   On: 09/20/2015 16:24   Dg Chest 2 View  09/21/2015  CLINICAL DATA:  Left-sided weakness EXAM: CHEST  2 VIEW COMPARISON:  01/23/2015 chest radiograph. FINDINGS: Sternotomy wires appear aligned and intact. Stable configuration of single lead left subclavian ICD. Stable cardiomediastinal silhouette with normal heart size. No pneumothorax. No pleural effusion. No pulmonary edema. No acute consolidative airspace disease. IMPRESSION: No active cardiopulmonary disease. Electronically Signed   By: Ilona Sorrel M.D.   On: 09/21/2015 08:30   Ct Head Wo Contrast  09/21/2015  CLINICAL DATA:  Left-sided weakness.  Hypertension. EXAM: CT HEAD WITHOUT CONTRAST TECHNIQUE: Contiguous axial images were obtained from the base of the skull through the vertex without intravenous contrast. COMPARISON:  September 20, 2015. FINDINGS: Brain: The ventricles are normal in size and configuration for age. There is no intracranial mass, hemorrhage, extra-axial fluid  collection, or midline shift. There is patchy small vessel disease in the centra semiovale bilaterally. Elsewhere gray-white compartments appear normal. No acute infarct is evident. Vascular: There are foci of calcification in the distal vertebral arteries as well as in the cavernous carotid arteries bilaterally. Skull: The bony calvarium appears intact. Sinuses/Orbits: Orbits appear symmetric bilaterally. There is opacification of multiple ethmoid air cells bilaterally. There is milder mucosal thickening in the sphenoid sinuses bilaterally. There is opacification in the superior  right maxillary antrum. Other: Mastoid air cells are clear. IMPRESSION: Patchy periventricular small vessel disease. No intracranial mass, hemorrhage, or acute appearing infarct. There are foci of atherosclerotic vascular calcification. There is paranasal sinus disease at multiple sites, most notably in the ethmoid air cells bilaterally. Electronically Signed   By: Lowella Grip III M.D.   On: 09/21/2015 10:09   Ct Head Wo Contrast  09/20/2015  CLINICAL DATA:  Acute encephalopathy. EXAM: CT HEAD WITHOUT CONTRAST TECHNIQUE: Contiguous axial images were obtained from the base of the skull through the vertex without intravenous contrast. COMPARISON:  01/16/2015 FINDINGS: Mild chronic small vessel disease throughout the deep white matter. No acute intracranial abnormality. Specifically, no hemorrhage, hydrocephalus, mass lesion, acute infarction, or significant intracranial injury. No acute calvarial abnormality. Mucosal thickening in the ethmoid air cells. No visible air-fluid levels. Mastoid air cells are clear. 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 No acute intracranial abnormality. Chronic ethmoid sinusitis. Electronically Signed   By: Rolm Baptise M.D.   On: 09/20/2015 11:39   Ct Angio Neck W Or Wo Contrast  09/20/2015  CLINICAL DATA:  Left-sided weakness. EXAM: CT ANGIOGRAPHY HEAD AND NECK TECHNIQUE: Multidetector CT imaging of the head and neck was performed using the standard protocol during bolus administration of intravenous contrast. Multiplanar CT image reconstructions and MIPs were obtained to evaluate the vascular anatomy. Carotid stenosis measurements (when applicable) are obtained utilizing NASCET criteria, using the distal internal carotid diameter as the denominator. CONTRAST:  100 mL Isovue 370 IV COMPARISON:  CT head 09/20/2015 FINDINGS: CTA NECK Aortic arch: Significant streak artifact through the chest and lower neck related to left-sided pacemaker and  dense contrast in the right subclavian vein contrast injection. This obscures the aortic arch significantly. Proximal great vessels also completely obscured due to artifact. Right carotid system: Atherosclerotic calcification in the right carotid bifurcation without significant stenosis. Left carotid system: Atherosclerotic calcification left carotid bifurcation with mild stenosis estimated 25% diameter stenosis. Left external carotid artery patent. Vertebral arteries:Proximal vertebral arteries obscured by artifact. The left vertebral artery is dominant. Both vertebral arteries are patent to C1 without significant stenosis. Skeleton: Disc degeneration and spondylosis in the cervical spine. No fracture or bone lesion. Poor dentition. Other neck: Negative for mass or adenopathy in the neck. CTA HEAD Anterior circulation: Advanced atherosclerotic calcification in the cavernous carotid bilaterally with severe stenosis bilaterally. Estimation of luminal stenosis difficult due to extensive vascular calcification. Supraclinoid internal carotid artery widely patent bilaterally. Anterior and middle cerebral arteries patent bilaterally without significant stenosis. Posterior circulation: Severe calcific stenosis distal left vertebral artery. Moderate calcific stenosis distal right vertebral artery. PICA patent bilaterally. Basilar patent without significant stenosis. AICA, superior cerebellar, posterior cerebral arteries patent bilaterally. Mild irregularity in the posterior cerebral artery bilaterally. Venous sinuses: Patent Anatomic variants: No cerebral aneurysm or vascular malformation Delayed phase: Normal enhancement on delayed imaging. Chronic microvascular ischemic changes in the white matter. IMPRESSION: Mild atherosclerotic calcification in the carotid bifurcation bilaterally without significant stenosis Severe stenosis distal left  vertebral artery and moderately severe stenosis distal right vertebral artery at the  skullbase. Advanced atherosclerotic calcification in the cavernous carotid artery bilaterally with severe stenosis bilaterally. Electronically Signed   By: Franchot Gallo M.D.   On: 09/20/2015 16:24        Scheduled Meds: . sodium chloride   Intravenous Once  . amitriptyline  50 mg Oral QHS  . aspirin EC  325 mg Oral Daily  . carvedilol  12.5 mg Oral BID  . hydrALAZINE  25 mg Oral BID  . insulin aspart  0-15 Units Subcutaneous TID WC  . insulin NPH Human  15 Units Subcutaneous BID AC & HS  . pantoprazole  40 mg Oral BID AC  . sodium chloride flush  3 mL Intravenous Q12H   Continuous Infusions: . sodium chloride 75 mL/hr at 09/21/15 0900  . lactated ringers 10 mL/hr at 09/19/15 1149        Time spent: 38 minutes    Thomas Prins, MD [facility] Pager [phone]  If 7PM-7AM, please contact night-coverage [email or web address] Password [facility] 09/21/2015, 10:58 AM

## 2015-09-21 NOTE — Progress Notes (Signed)
STROKE TEAM PROGRESS NOTE   HISTORY OF PRESENT ILLNESS (per record) Thomas Sosa is an 60 y.o. male history of hypertension, diabetes mellitus, prostate cancer, hyperlipidemia, coronary artery disease stroke and peripheral vascular disease, admitted on 09/16/2015 for hematemesis. Patient was previously treated with Coumadin, but had not taken Coumadin for about 1 month prior to admission. He also had not taken Prilosec for about the same length of time. Neurology consultation was obtained because of complaint of new onset left-sided weakness which occurred overnight. He was last known well at midnight last night. No facial droop is noted. Speech is not changed. He has not been started back on anticoagulation at this point. Gastroenterologist has okayed resumption of anticoagulation, however. CT scan of his head showed no acute intracranial abnormality. CT angiogram shows severe stenosis of distal left vertebral artery and moderately severe startle stenosis of right vertebral artery. Advanced atherosclerotic calcification was noted in the cavernous carotid artery bilaterally with severe stenosis. NIH stroke score at the time of this evaluation was 2. He was LKW at 12:00 AM on 09/20/2015. Patient was not administered IV t-PA secondary to Beyond time window for treatment consideration and minimal deficits.   SUBJECTIVE (INTERVAL HISTORY) No family is at the bedside.  Overall he feels his condition is stable.    OBJECTIVE Temp:  [98 F (36.7 C)-99.9 F (37.7 C)] 99.6 F (37.6 C) (06/28 0545) Pulse Rate:  [77-80] 80 (06/28 0545) Cardiac Rhythm:  [-] Normal sinus rhythm (06/28 0700) Resp:  [16-18] 17 (06/28 0545) BP: (96-151)/(56-80) 115/62 mmHg (06/28 0545) SpO2:  [95 %-100 %] 95 % (06/28 0545) Weight:  [101.8 kg (224 lb 6.9 oz)] 101.8 kg (224 lb 6.9 oz) (06/27 1900)  CBC:  Recent Labs Lab 09/16/15 1832  09/18/15 0516  09/19/15 1236 09/20/15 0028  WBC 7.0  --  7.7  --   --   --   HGB 15.8  <  > 13.9  < > 14.3 14.2  HCT 44.9  < > 40.6  < > 40.3 40.0  MCV 87.0  --  87.3  --   --   --   PLT 270  --  238  --   --   --   < > = values in this interval not displayed.  Basic Metabolic Panel:  Recent Labs Lab 09/18/15 1746 09/20/15 0028  NA 137 133*  K 3.0* 3.3*  CL 100* 96*  CO2 29 28  GLUCOSE 157* 196*  BUN 12 15  CREATININE 0.69 0.83  CALCIUM 8.4* 8.5*    Lipid Panel:    Component Value Date/Time   CHOL 179 10/05/2014 0540   TRIG 253* 10/05/2014 0540   HDL 28* 10/05/2014 0540   CHOLHDL 6.4 10/05/2014 0540   VLDL 51* 10/05/2014 0540   LDLCALC 100* 10/05/2014 0540   HgbA1c:  Lab Results  Component Value Date   HGBA1C 11.20 01/20/2015   Urine Drug Screen: No results found for: LABOPIA, COCAINSCRNUR, LABBENZ, AMPHETMU, THCU, LABBARB    IMAGING  Ct Angio Head W Or Wo Contrast  09/20/2015  CLINICAL DATA:  Left-sided weakness. EXAM: CT ANGIOGRAPHY HEAD AND NECK TECHNIQUE: Multidetector CT imaging of the head and neck was performed using the standard protocol during bolus administration of intravenous contrast. Multiplanar CT image reconstructions and MIPs were obtained to evaluate the vascular anatomy. Carotid stenosis measurements (when applicable) are obtained utilizing NASCET criteria, using the distal internal carotid diameter as the denominator. CONTRAST:  100 mL Isovue 370 IV COMPARISON:  CT head 09/20/2015 FINDINGS: CTA NECK Aortic arch: Significant streak artifact through the chest and lower neck related to left-sided pacemaker and dense contrast in the right subclavian vein contrast injection. This obscures the aortic arch significantly. Proximal great vessels also completely obscured due to artifact. Right carotid system: Atherosclerotic calcification in the right carotid bifurcation without significant stenosis. Left carotid system: Atherosclerotic calcification left carotid bifurcation with mild stenosis estimated 25% diameter stenosis. Left external carotid artery  patent. Vertebral arteries:Proximal vertebral arteries obscured by artifact. The left vertebral artery is dominant. Both vertebral arteries are patent to C1 without significant stenosis. Skeleton: Disc degeneration and spondylosis in the cervical spine. No fracture or bone lesion. Poor dentition. Other neck: Negative for mass or adenopathy in the neck. CTA HEAD Anterior circulation: Advanced atherosclerotic calcification in the cavernous carotid bilaterally with severe stenosis bilaterally. Estimation of luminal stenosis difficult due to extensive vascular calcification. Supraclinoid internal carotid artery widely patent bilaterally. Anterior and middle cerebral arteries patent bilaterally without significant stenosis. Posterior circulation: Severe calcific stenosis distal left vertebral artery. Moderate calcific stenosis distal right vertebral artery. PICA patent bilaterally. Basilar patent without significant stenosis. AICA, superior cerebellar, posterior cerebral arteries patent bilaterally. Mild irregularity in the posterior cerebral artery bilaterally. Venous sinuses: Patent Anatomic variants: No cerebral aneurysm or vascular malformation Delayed phase: Normal enhancement on delayed imaging. Chronic microvascular ischemic changes in the white matter. IMPRESSION: Mild atherosclerotic calcification in the carotid bifurcation bilaterally without significant stenosis Severe stenosis distal left vertebral artery and moderately severe stenosis distal right vertebral artery at the skullbase. Advanced atherosclerotic calcification in the cavernous carotid artery bilaterally with severe stenosis bilaterally. Electronically Signed   By: Franchot Gallo M.D.   On: 09/20/2015 16:24   Dg Chest 2 View  09/21/2015  CLINICAL DATA:  Left-sided weakness EXAM: CHEST  2 VIEW COMPARISON:  01/23/2015 chest radiograph. FINDINGS: Sternotomy wires appear aligned and intact. Stable configuration of single lead left subclavian ICD.  Stable cardiomediastinal silhouette with normal heart size. No pneumothorax. No pleural effusion. No pulmonary edema. No acute consolidative airspace disease. IMPRESSION: No active cardiopulmonary disease. Electronically Signed   By: Ilona Sorrel M.D.   On: 09/21/2015 08:30   Ct Head Wo Contrast  09/20/2015  CLINICAL DATA:  Acute encephalopathy. EXAM: CT HEAD WITHOUT CONTRAST TECHNIQUE: Contiguous axial images were obtained from the base of the skull through the vertex without intravenous contrast. COMPARISON:  01/16/2015 FINDINGS: Mild chronic small vessel disease throughout the deep white matter. No acute intracranial abnormality. Specifically, no hemorrhage, hydrocephalus, mass lesion, acute infarction, or significant intracranial injury. No acute calvarial abnormality. Mucosal thickening in the ethmoid air cells. No visible air-fluid levels. Mastoid air cells are clear. 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 No acute intracranial abnormality. Chronic ethmoid sinusitis. Electronically Signed   By: Rolm Baptise M.D.   On: 09/20/2015 11:39   Ct Angio Neck W Or Wo Contrast  09/20/2015  CLINICAL DATA:  Left-sided weakness. EXAM: CT ANGIOGRAPHY HEAD AND NECK TECHNIQUE: Multidetector CT imaging of the head and neck was performed using the standard protocol during bolus administration of intravenous contrast. Multiplanar CT image reconstructions and MIPs were obtained to evaluate the vascular anatomy. Carotid stenosis measurements (when applicable) are obtained utilizing NASCET criteria, using the distal internal carotid diameter as the denominator. CONTRAST:  100 mL Isovue 370 IV COMPARISON:  CT head 09/20/2015 FINDINGS: CTA NECK Aortic arch: Significant streak artifact through the chest and lower neck related to left-sided pacemaker and dense contrast  in the right subclavian vein contrast injection. This obscures the aortic arch significantly. Proximal great vessels also  completely obscured due to artifact. Right carotid system: Atherosclerotic calcification in the right carotid bifurcation without significant stenosis. Left carotid system: Atherosclerotic calcification left carotid bifurcation with mild stenosis estimated 25% diameter stenosis. Left external carotid artery patent. Vertebral arteries:Proximal vertebral arteries obscured by artifact. The left vertebral artery is dominant. Both vertebral arteries are patent to C1 without significant stenosis. Skeleton: Disc degeneration and spondylosis in the cervical spine. No fracture or bone lesion. Poor dentition. Other neck: Negative for mass or adenopathy in the neck. CTA HEAD Anterior circulation: Advanced atherosclerotic calcification in the cavernous carotid bilaterally with severe stenosis bilaterally. Estimation of luminal stenosis difficult due to extensive vascular calcification. Supraclinoid internal carotid artery widely patent bilaterally. Anterior and middle cerebral arteries patent bilaterally without significant stenosis. Posterior circulation: Severe calcific stenosis distal left vertebral artery. Moderate calcific stenosis distal right vertebral artery. PICA patent bilaterally. Basilar patent without significant stenosis. AICA, superior cerebellar, posterior cerebral arteries patent bilaterally. Mild irregularity in the posterior cerebral artery bilaterally. Venous sinuses: Patent Anatomic variants: No cerebral aneurysm or vascular malformation Delayed phase: Normal enhancement on delayed imaging. Chronic microvascular ischemic changes in the white matter. IMPRESSION: Mild atherosclerotic calcification in the carotid bifurcation bilaterally without significant stenosis Severe stenosis distal left vertebral artery and moderately severe stenosis distal right vertebral artery at the skullbase. Advanced atherosclerotic calcification in the cavernous carotid artery bilaterally with severe stenosis bilaterally.  Electronically Signed   By: Franchot Gallo M.D.   On: 09/20/2015 16:24    PHYSICAL EXAM  frail middle-aged African-American male currently not in distress. . Afebrile. Head is nontraumatic. Neck is supple without bruit.    Cardiac exam no murmur or gallop. Lungs are clear to auscultation. Distal pulses are well felt. Neurological Exam :  Awake alert oriented x 3 normal speech and language. Mild left lower face asymmetry. Tongue midline. Left UE drift. Mild diminished fine finger movements on left. Orbits right over left upper extremity. Mild left grip weak.Mild left hip flexor weakness.. Normal sensation . Normal coordination. ASSESSMENT/PLAN Mr. Ashyr Cuen is a 60 y.o. male with history of hypertension, diabetes mellitus, prostate cancer, hyperlipidemia, coronary artery disease stroke and peripheral vascular disease admitted for hematemesis who developed L sided weakness in hospital. He did not receive IV t-PA due to out of the window, minimal deficits.   Stroke:  Possible new R brain stroke not seen on CT  MRI  pacer  CTA  Severe stenosis L VA, R VA, B ICAs  2D Echo  pending   LDL 100  HgbA1c pending  SCDs ordered for VTE prophylaxis  Diet NPO time specified  aspirin 81 mg daily prior to admission, had been on coumadin but stopped 1 month ago due to financial issues, now on aspirin 325 mg daily. Given spontaneous hematemesis and no documented indication, do not recommend anticoagulation, continue aspirin, low dose can be used for ongoing secondary stroke prevention.  Patient counseled to be compliant with his antithrombotic medications  Ongoing aggressive stroke risk factor management  Therapy recommendations:  pending   Disposition:  pending   Hypertension  Stable  Long-term BP goal normotensive  Hyperlipidemia  Home meds:  lipitor 80  resumed in hospital  LDL 100, goal < 70  Continue statin at discharge  Diabetes  HgbA1c pending (was over 11 fall 2016), goal  < 7.0  Likely Uncontrolled  Other Stroke Risk Factors  Former Cigarette smoker  Obesity, Body mass index is 30.43 kg/(m^2)., recommend weight loss, diet and exercise as appropriate   Hx stroke/TIA  09/2014 right MCA territory infarcts in the setting of previous R peri-rolandic subcortical and R occipital infarcts 1 week prior, first infarcts thought to be due to small vessel disease, now thought to be embolic, source unknown. Started on warfarin  Coronary artery disease s/p CABG  PAD  Cardiomyopathy, EF 25-30%.   Chronic systolic CHF  Other Active Problems  Hematemesis, possible GIB  Hypokalemia   Prostate cancer   non-sustained VT, prior syncope. ICD implantation by Cardiology  Hospital day #   Deerfield Collins for Pager information 09/21/2015 11:13 AM  I have personally examined this patient, reviewed notes, independently viewed imaging studies, participated in medical decision making and plan of care. I have made any additions or clarifications directly to the above note. Agree with note above. He presented with  increased left leg weakness in the setting of prior deficits from old stroke. CT scan does not show any acute abnormality and MRI cannot be obtained due to defibrillator. He has had multiple prior strokes and CT angiogram shows moderate stenosis of both vertebral arteries. He remains at risk for recurrent strokes, TIAs, neurological worsening. He was previously on warfarin for presumed multiple strokes with cardiomyopathy and ejection fraction of 25%. There is no clear documented atrial fibrillation. Continue aspirin for stroke prevention as patient cannot afford warfarin and presented with hematemesis. Hence is not a good long-term candidate for dual antiplatelet therapy or anticoagulation. Maintain aggressive risk factor modification. Greater than 50% time during this 25 minute visit was spent on counseling and coordination of care of  her stroke risk, prevention and treatment Antony Contras, MD Medical Director Mobile Pager: [phone] 09/21/2015 2:23 PM    To contact Stroke Continuity provider, please refer to [email or web address] After hours, contact General Neurology

## 2015-09-21 NOTE — Progress Notes (Signed)
PT Cancellation Note  Patient Details Name: Thomas Sosa MRN: [id number] DOB: February 01, 1956   Cancelled Treatment:    Reason Eval/Treat Not Completed: Patient at procedure or test/unavailable. Pt off the floor at CT for head and then ECHO. PT to return as able to complete eval.   Kingsley Callander 09/21/2015, 9:46 AM   Kittie Plater, PT, DPT Pager #: [phone] Office #: [phone]

## 2015-09-21 NOTE — Progress Notes (Signed)
Inpatient Diabetes Program Recommendations  AACE/ADA: New Consensus Statement on Inpatient Glycemic Control (2015)  Target Ranges:  Prepandial:   less than 140 mg/dL      Peak postprandial:   less than 180 mg/dL (1-2 hours)      Critically ill patients:  140 - 180 mg/dL   Lab Results  Component Value Date   GLUCAP 308* 09/21/2015   HGBA1C 11.20 01/20/2015    Review of Glycemic ControlResults for JADEL, WYSONG (MRN [id number]) as of 09/21/2015 08:24  Ref. Range 09/20/2015 17:10 09/20/2015 21:01 09/21/2015 01:08 09/21/2015 04:07  Glucose-Capillary Latest Ref Range: 65-99 mg/dL 201 (H) 291 (H) 296 (H) 308 (H)   Note that patient seen by Diabetes Coordinator on 6/27.  He states that he cannot afford Levemir and Novolog insulin at home.  Please consider restarting Levemir 30 units daily and Novolog meal coverage 5 units tid with meals while in the hospital.   Will order case management consult due to inability to afford medications.  If costs are too high, will need cheaper alternative such as:  Novolin R insulin to replace Novolog 10 units tidwc and ReliOn NPH bid. Consider NPH 15 units bid (AM and HS)  Will follow. Thanks, Adah Perl, RN, BC-ADM Inpatient Diabetes Coordinator Pager [phone] (8a-5p)

## 2015-09-22 ENCOUNTER — Inpatient Hospital Stay (HOSPITAL_COMMUNITY): Payer: Medicare HMO

## 2015-09-22 DIAGNOSIS — Z95810 Presence of automatic (implantable) cardiac defibrillator: Secondary | ICD-10-CM

## 2015-09-22 DIAGNOSIS — I159 Secondary hypertension, unspecified: Secondary | ICD-10-CM

## 2015-09-22 DIAGNOSIS — I6789 Other cerebrovascular disease: Secondary | ICD-10-CM

## 2015-09-22 LAB — BASIC METABOLIC PANEL
Anion gap: 8 (ref 5–15)
BUN: 13 mg/dL (ref 6–20)
CALCIUM: 8.5 mg/dL — AB (ref 8.9–10.3)
CO2: 27 mmol/L (ref 22–32)
CREATININE: 0.73 mg/dL (ref 0.61–1.24)
Chloride: 102 mmol/L (ref 101–111)
GFR calc non Af Amer: >60 mL/min (ref >60)
Glucose, Bld: 267 mg/dL — ABNORMAL HIGH (ref 65–99)
Potassium: 4 mmol/L (ref 3.5–5.1)
SODIUM: 137 mmol/L (ref 135–145)

## 2015-09-22 LAB — ECHOCARDIOGRAM COMPLETE
CHL CUP TV REG PEAK VELOCITY: 269 cm/s
E decel time: 215 msec
EERAT: 5.34
FS: 5 % — AB (ref 28–44)
HEIGHTINCHES: 72 in
IVS/LV PW RATIO, ED: 1.28
LA diam end sys: 43 mm
LA diam index: 2.06 cm/m2
LA vol index: 33.1 mL/m2
LASIZE: 43 mm
LAVOL: 68.9 mL
LAVOLA4C: 51.7 mL
LV TDI E'LATERAL: 10
LVEEAVG: 5.34
LVEEMED: 5.34
LVELAT: 10 cm/s
LVOT SV: 66 mL
LVOT VTI: 12.4 cm
LVOT area: 5.31 cm2
LVOT diameter: 26 mm
LVOTPV: 64 cm/s
MV Dec: 215
MVPKAVEL: 49.5 m/s
MVPKEVEL: 53.4 m/s
PW: 10.3 mm — AB (ref 0.6–1.1)
RV TAPSE: 11.9 mm
RV sys press: 32 mmHg
TDI e' medial: 7.07
TRMAXVEL: 269 cm/s
WEIGHTICAEL: 3590.85 [oz_av]

## 2015-09-22 LAB — LIPID PANEL
CHOL/HDL RATIO: 3.5 ratio
CHOLESTEROL: 127 mg/dL (ref 0–200)
HDL: 36 mg/dL — ABNORMAL LOW (ref >40)
LDL CALC: 73 mg/dL (ref 0–99)
Triglycerides: 90 mg/dL (ref <150)
VLDL: 18 mg/dL (ref 0–40)

## 2015-09-22 LAB — CBC
HCT: 37.5 % — ABNORMAL LOW (ref 39.0–52.0)
Hemoglobin: 12.4 g/dL — ABNORMAL LOW (ref 13.0–17.0)
MCH: 29.8 pg (ref 26.0–34.0)
MCHC: 33.1 g/dL (ref 30.0–36.0)
MCV: 90.1 fL (ref 78.0–100.0)
PLATELETS: 211 10*3/uL (ref 150–400)
RBC: 4.16 MIL/uL — AB (ref 4.22–5.81)
RDW: 12.8 % (ref 11.5–15.5)
WBC: 8 10*3/uL (ref 4.0–10.5)

## 2015-09-22 LAB — GLUCOSE, CAPILLARY
GLUCOSE-CAPILLARY: 246 mg/dL — AB (ref 65–99)
Glucose-Capillary: 171 mg/dL — ABNORMAL HIGH (ref 65–99)
Glucose-Capillary: 137 mg/dL — ABNORMAL HIGH (ref 65–99)
Glucose-Capillary: 165 mg/dL — ABNORMAL HIGH (ref 65–99)

## 2015-09-22 LAB — HEMOGLOBIN A1C
HEMOGLOBIN A1C: 11.8 % — AB (ref 4.8–5.6)
Mean Plasma Glucose: 292 mg/dL

## 2015-09-22 LAB — MAGNESIUM: MAGNESIUM: 1.7 mg/dL (ref 1.7–2.4)

## 2015-09-22 MED ORDER — MAGNESIUM SULFATE 50 % IJ SOLN
3.0000 g | Freq: Once | INTRAVENOUS | Status: AC
  Administered 2015-09-22: 3 g via INTRAVENOUS
  Filled 2015-09-22: qty 6

## 2015-09-22 MED ORDER — INSULIN ASPART 100 UNIT/ML ~~LOC~~ SOLN
5.0000 [IU] | Freq: Three times a day (TID) | SUBCUTANEOUS | Status: DC
  Administered 2015-09-22 – 2015-09-24 (×8): 5 [IU] via SUBCUTANEOUS

## 2015-09-22 MED ORDER — PERFLUTREN LIPID MICROSPHERE
1.0000 mL | INTRAVENOUS | Status: AC | PRN
  Administered 2015-09-22: 2 mL via INTRAVENOUS
  Filled 2015-09-22: qty 10

## 2015-09-22 MED ORDER — ALUM & MAG HYDROXIDE-SIMETH 200-200-20 MG/5ML PO SUSP
30.0000 mL | ORAL | Status: DC | PRN
  Administered 2015-09-22: 30 mL via ORAL
  Filled 2015-09-22: qty 30

## 2015-09-22 MED ORDER — INSULIN NPH (HUMAN) (ISOPHANE) 100 UNIT/ML ~~LOC~~ SUSP
20.0000 [IU] | Freq: Two times a day (BID) | SUBCUTANEOUS | Status: DC
  Administered 2015-09-22 – 2015-09-24 (×5): 20 [IU] via SUBCUTANEOUS

## 2015-09-22 NOTE — Care Management Note (Signed)
Case Management Note  Patient Details  Name: Thomas Sosa MRN: [id number] Date of Birth: 1955-06-02  Subjective/Objective:                CM following for progression and d/c planning.    Action/Plan: 09/22/2015 Noted CM consult for assistance with medications, however per hospital adm records this pt has commercial insurance with medication benefits, therefore we are unable to assit with medications. CM will discuss this with pt. No DME or HH needs identified at this time.   Expected Discharge Date:                  Expected Discharge Plan:  Home/Self Care  In-House Referral:     Discharge planning Services  CM Consult  Post Acute Care Choice:    Choice offered to:     DME Arranged:    DME Agency:     HH Arranged:    HH Agency:     Status of Service:  In process, will continue to follow  If discussed at Long Length of Stay Meetings, dates discussed:    Additional Comments:  Adron Bene, RN 09/22/2015, 10:49 AM

## 2015-09-22 NOTE — Progress Notes (Signed)
  Echocardiogram 2D Echocardiogram with Definity has been performed.  Darlina Sicilian M 09/22/2015, 10:37 AM

## 2015-09-22 NOTE — Evaluation (Signed)
Physical Therapy Evaluation Patient Details Name: Thomas Sosa MRN: [id number] DOB: [birth year] Today's Date: 09/22/2015   History of Present Illness  60 y.o. male admitted for new onset L side weakness. CT (-) for acute infarct, unable to complete MRI due to pacemaker. 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  Clinical Impression  Patient presents with mild left sided weakness, neuropathy BLEs distal to knee, impaired proprioception and balance deficits due to above impacting mobility. Tolerated gait training with Min guard assist for safety. Distance limited due to stabbing pain in left foot worsened with weightbearing- reports this is new. Pt has supportive family at home. Recommend use of RW but pt prefers a cane. Encouraged ambulation with RN later. Will follow acutely to maximize independence and mobility and decrease fall risk prior to return home.    Follow Up Recommendations No PT follow up;Supervision for mobility/OOB    Equipment Recommendations  Rolling walker with 5" wheels    Recommendations for Other Services OT consult     Precautions / Restrictions Precautions Precautions: Fall Restrictions Weight Bearing Restrictions: No      Mobility  Bed Mobility Overal bed mobility: Modified Independent             General bed mobility comments: UP in chair upon PT arrival.   Transfers Overall transfer level: Needs assistance Equipment used: Rolling walker (2 wheeled) Transfers: Sit to/from Stand Sit to Stand: Supervision         General transfer comment: Supervision for safety.   Ambulation/Gait Ambulation/Gait assistance: Min guard Ambulation Distance (Feet): 100 Feet Assistive device: Rolling walker (2 wheeled) Gait Pattern/deviations: Step-through pattern;Decreased stride length;Decreased stance time - left;Decreased  step length - right;Trunk flexed;Narrow base of support   Gait velocity interpretation: Below normal speed for age/gender General Gait Details: Slow, mostly steady gait with short steps bilaterally. Cues to increase stride length and to use vision to compensate for lack of sensation/proprioception. Sp02 96% on RA post ambulation.  Stairs            Wheelchair Mobility    Modified Rankin (Stroke Patients Only)       Balance Overall balance assessment: Needs assistance Sitting-balance support: Feet supported;No upper extremity supported Sitting balance-Leahy Scale: Good     Standing balance support: During functional activity Standing balance-Leahy Scale: Poor Standing balance comment: LOB x2 to L side, pt able to regain balance without physical assist                             Pertinent Vitals/Pain Pain Assessment: Faces Pain Score: 8  Faces Pain Scale: Hurts even more Pain Location: plantar surface of left foot Pain Descriptors / Indicators: Sharp;Stabbing Pain Intervention(s): Monitored during session;Repositioned    Home Living Family/patient expects to be discharged to:: Private residence Living Arrangements: Spouse/significant other;Children Available Help at Discharge: Family;Available 24 hours/day Type of Home: House Home Access: Stairs to enter Entrance Stairs-Rails: Right Entrance Stairs-Number of Steps: 5 Home Layout: One level Home Equipment: Hand held shower head      Prior Function Level of Independence: Independent         Comments: Retired Biochemist, clinical, 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 Has 10 children, 3 of which live with him currently. Reports using SPC PRN but does not have it anymore.     Hand  Dominance   Dominant Hand: Right    Extremity/Trunk Assessment   Upper Extremity Assessment: Defer to OT evaluation       LUE Deficits / Details: very mild weakness, overall 4/5; AROM wfl; Sensation intact   Lower  Extremity Assessment: RLE deficits/detail;LLE deficits/detail RLE Deficits / Details: Hx of neuropathy and no sensation distal to knee. LLE Deficits / Details: Hx of neuropathy and no sensation distal to knee. MIld weakness noted but functional.  Cervical / Trunk Assessment: Normal  Communication   Communication: No difficulties  Cognition Arousal/Alertness: Awake/alert Behavior During Therapy: WFL for tasks assessed/performed Overall Cognitive Status: Within Functional Limits for tasks assessed                      General Comments      Exercises        Assessment/Plan    PT Assessment Patient needs continued PT services  PT Diagnosis Difficulty walking;Acute pain   PT Problem List Decreased strength;Pain;Impaired sensation;Decreased activity tolerance;Decreased balance;Decreased mobility;Decreased safety awareness  PT Treatment Interventions Balance training;Gait training;Functional mobility training;Therapeutic activities;Therapeutic exercise;Stair training;Patient/family education   PT Goals (Current goals can be found in the Care Plan section) Acute Rehab PT Goals Patient Stated Goal: to go home today PT Goal Formulation: With patient Time For Goal Achievement: 10/06/15 Potential to Achieve Goals: Fair    Frequency Min 3X/week   Barriers to discharge Inaccessible home environment stairs to enter home    Co-evaluation               End of Session Equipment Utilized During Treatment: Gait belt Activity Tolerance: Patient tolerated treatment well Patient left: in chair;with call bell/phone within reach;with chair alarm set Nurse Communication: Mobility status         Time: 1440-1500 PT Time Calculation (min) (ACUTE ONLY): 20 min   Charges:   PT Evaluation $PT Eval Moderate Complexity: 1 Procedure     PT G Codes:        Stacie Knutzen A Kinsie Belford 09/22/2015, 3:21 PM Wray Kearns, [place], DPT [phone]

## 2015-09-22 NOTE — Progress Notes (Signed)
PROGRESS NOTE    Thomas Sosa  [id number] DOB: 05-21-1955 DOA: 09/16/2015 PCP: [name], NP    Brief Narrative:  As written by Dr. Tamala Julian on 6/23 60 year old male history of hypertension, diabetes, prostate cancer status post radioactive seed implantation, CVA on anticoagulation therapy, presented with complaints of nausea and vomiting with dark emesis. Patient stated he had run out of his medications for proximal one month. He also ran out of his omeprazole proximally 3 days ago. His nausea and vomiting started approximately 2 days ago, he has been unable tolerate any food or drink. Of note, patient has been off of his Coumadin for approximately one month as he was unable to afford any of his medications. Admitted for hematemesis possible GI bleed. GI consulted and pending.  Interim History No further hematemesis. Patient with persistent heartburn. EGD done on 6/26 showed erosive esophagitis.  Today patient is lethargic, left side weakness started overnight. Stroke wotk up.    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 Patient subsequently underwent upper endoscopy that showed distal erosive esophagitis. Patient was placed on a PPI and serial CBCs done. Patient's hemoglobin remained stable. Was recommended that patient be on a PPI twice daily for 2 months and then daily on afterwards.  #2 new left-sided weakness/possible new right brain stroke not seen on CT Consent for an acute CVA. Patient with prior history of stroke. Patient had noted to be off  his Coumadin prior to admission. CT head which was done was unremarkable. Patient status post pacemaker placement and a such MRI on obtainable. 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 Fasting lipid panel done had a LDL of 100. Hemoglobin A1c pending. 2-D echo pending. Patient was on aspirin prior to admission had been on Coumadin but discontinued one month prior to admission due to financial issues. Patient also presented with hematemesis. Patient has been seen in consultation by neurology who felt that patient did not need anticoagulation at this time and to continue patient on aspirin for secondary stroke prevention. PT/OT/ST. Neurology following and appreciate input and recommendations.  #3 diabetes mellitus type 2 Hemoglobin A1c was 11.20 on 01/20/2015. Hemoglobin A1c is 11.8 on 09/21/2015. CBGs have ranged from 171- 355. Due to financial issues patient was started on NPH. Increase NPH to 20 units twice daily. Place on meal coverage NovoLog 5 units 3 times daily. Sliding scale insulin.  #4 ischemic cardiomyopathy/coronary artery disease status post CABG/chronic systolic CHF Currently stable. Patient euvolemic on examination. Patient denies any chest pain. Continue aspirin, Lipitor, Coreg, hydralazine.  #5 hyperlipidemia Fasting lipid panel with a LDL of 73. Continue statin.  #6 hypokalemia/hypomagnesemia Replete.    DVT prophylaxis: SCDs Code Status: Full Family Communication: Updated patient. No family at bedside. Disposition Plan: Pending PT evaluation.   Consultants:   Gastroenterology: Dr. Paulita Fujita 09/18/2015  Neurology: Dr. Nicole Kindred 09/20/2015  Procedures:   CT abdomen and pelvis 09/16/2015  CT head 09/20/2015, 09/21/2015   CT angiogram head and neck 09/20/2015  Chest x-ray  09/21/2015  Abdominal x-ray 09/16/2015  Upper endoscopy 09/19/2015 per Dr.  Michail Sermon  2-D echo 09/22/2015    Antimicrobials:   None   Subjective: Patient denies any further hematemesis. No chest pain. No shortness of breath. Patient states left-sided weakness improvement.  Objective: Filed Vitals:   09/22/15 0213 09/22/15 0518 09/22/15 1021 09/22/15 1353  BP: 129/60 135/74 144/78 135/58  Pulse: 85 85 76 65  Temp: 98.9 F (37.2 C) 98.8 F (37.1 C) 98.3 F (36.8 C) 98.3 F (36.8 C)  TempSrc:   Oral Oral  Resp: 16 16 16 18   Height:      Weight:      SpO2: 97% 94% 100% 100%    Intake/Output Summary (Last 24 hours) at 09/22/15 1624 Last data filed at 09/22/15 1357  Gross per 24 hour  Intake    462 ml  Output   1050 ml  Net   -588 ml   Filed Weights   09/16/15 1823 09/20/15 1900  Weight: 106.595 kg (235 lb) 101.8 kg (224 lb 6.9 oz)    Examination:   General exam: Appears calm and comfortable  Respiratory system: Clear to auscultation. Respiratory effort normal. Cardiovascular system: S1 & S2 heard, RRR. No JVD, murmurs, rubs, gallops or clicks. No pedal edema. Gastrointestinal system: Abdomen is nondistended, soft and nontender. No organomegaly or masses felt. Normal bowel sounds heard. Central nervous system: Alert and oriented. No focal neurological deficits. Extremities: 4/5 left upper extremity and 3-4/5 left lower extremity strength. 5/5 right upper extremity strength and right lower extremity strength. Skin: No rashes, lesions or ulcers Psychiatry: Judgement and insight appear normal. Mood & affect appropriate.     Data Reviewed: I have personally reviewed following labs and imaging studies  CBC:  Recent Labs Lab 09/16/15 1832  09/18/15 0516  09/19/15 0022 09/19/15 1236 09/20/15 0028 09/21/15 1421 09/22/15 0532  WBC 7.0  --  7.7  --   --   --   --  7.4 8.0  HGB 15.8  < > 13.9  < > 13.0 14.3 14.2 11.9* 12.4*  HCT 44.9  < > 40.6  < > 37.7* 40.3 40.0 35.3* 37.5*  MCV 87.0  --  87.3  --   --   --   --  88.7 90.1  PLT 270   --  238  --   --   --   --  209 211  < > = values in this interval not displayed. Basic Metabolic Panel:  Recent Labs Lab 09/18/15 0516 09/18/15 1746 09/20/15 0028 09/21/15 1421 09/22/15 0532  NA 137 137 133* 136 137  K 3.0* 3.0* 3.3* 3.3* 4.0  CL 98* 100* 96* 99* 102  CO2 30 29 28 29 27   GLUCOSE 186* 157* 196* 286* 267*  BUN 12 12 15 13 13   CREATININE 0.72 0.69 0.83 0.94 0.73  CALCIUM 8.6* 8.4* 8.5* 8.3* 8.5*  MG  --   --   --  1.4* 1.7   GFR: Estimated Creatinine Clearance: 121.3 mL/min (by C-G formula based on Cr of 0.73). Liver Function Tests:  Recent Labs Lab 09/16/15 1923 09/21/15 1421  AST 13* 9*  ALT 11* 8*  ALKPHOS 80 61  BILITOT 1.3* 0.6  PROT 8.2* 6.6  ALBUMIN 3.4* 2.5*    Recent Labs Lab 09/16/15 1923  LIPASE 17   No results for input(s): AMMONIA in the last 168 hours. Coagulation Profile:  Recent Labs Lab 09/16/15 1923  INR 1.11   Cardiac Enzymes:  Recent Labs Lab 09/16/15 2354 09/17/15 0615 09/17/15 1042  TROPONINI 0.05* 0.06* 0.06*   BNP (last 3 results) No results for input(s): PROBNP in the last 8760 hours. HbA1C:  Recent Labs  09/21/15 1421  HGBA1C 11.8*   CBG:  Recent Labs Lab 09/21/15 1206 09/21/15 1702 09/21/15 2136 09/22/15 0757 09/22/15 1154  GLUCAP 246* 273* 355* 246* 171*   Lipid Profile:  Recent Labs  09/22/15 0532  CHOL 127  HDL 36*  LDLCALC 73  TRIG 90  CHOLHDL 3.5   Thyroid Function Tests: No results for input(s): TSH, T4TOTAL, FREET4, T3FREE, THYROIDAB in the last 72 hours. Anemia Panel: No results for input(s): VITAMINB12, FOLATE, FERRITIN, TIBC, IRON, RETICCTPCT in the last 72 hours. Sepsis Labs: No results for input(s): PROCALCITON, LATICACIDVEN in the last 168 hours.  No results found for this or any previous visit (from the past 240 hour(s)).       Radiology Studies: Dg Chest 2 View  09/21/2015  CLINICAL DATA:  Left-sided weakness EXAM: CHEST  2 VIEW COMPARISON:  01/23/2015  chest radiograph. FINDINGS: Sternotomy wires appear aligned and intact. Stable configuration of single lead left subclavian ICD. Stable cardiomediastinal silhouette with normal heart size. No pneumothorax. No pleural effusion. No pulmonary edema. No acute consolidative airspace disease. IMPRESSION: No active cardiopulmonary disease. Electronically Signed   By: Ilona Sorrel M.D.   On: 09/21/2015 08:30   Ct Head Wo Contrast  09/21/2015  CLINICAL DATA:  Left-sided weakness.  Hypertension. EXAM: CT HEAD WITHOUT CONTRAST TECHNIQUE: Contiguous axial images were obtained from the base of the skull through the vertex without intravenous contrast. COMPARISON:  September 20, 2015. FINDINGS: Brain: The ventricles are normal in size and configuration for age. There is no intracranial mass, hemorrhage, extra-axial fluid collection, or midline shift. There is patchy small vessel disease in the centra semiovale bilaterally. Elsewhere gray-white compartments appear normal. No acute infarct is evident. Vascular: There are foci of calcification in the distal vertebral arteries as well as in the cavernous carotid arteries bilaterally. Skull: The bony calvarium appears intact. Sinuses/Orbits: Orbits appear symmetric bilaterally. There is opacification of multiple ethmoid air cells bilaterally. There is milder mucosal thickening in the sphenoid sinuses bilaterally. There is opacification in the superior right maxillary antrum. Other: Mastoid air cells are clear. IMPRESSION: Patchy periventricular small vessel disease. No intracranial mass, hemorrhage, or acute appearing infarct. There are foci of atherosclerotic vascular calcification. There is paranasal sinus disease at multiple sites, most notably in the ethmoid air cells bilaterally. Electronically Signed   By: Lowella Grip III M.D.   On: 09/21/2015 10:09        Scheduled Meds: . sodium chloride   Intravenous Once  . amitriptyline  50 mg Oral QHS  . aspirin EC  325 mg  Oral Daily  . atorvastatin  80 mg Oral q1800  . carvedilol  12.5 mg Oral BID  . hydrALAZINE  25 mg Oral BID  . insulin aspart  0-15 Units Subcutaneous TID WC  . insulin aspart  5 Units Subcutaneous TID WC  . insulin NPH Human  20 Units Subcutaneous BID AC & HS  . pantoprazole  40 mg Oral BID AC  . sodium chloride flush  3 mL Intravenous Q12H   Continuous Infusions:     LOS: 1 day    Time spent: 61 minutes    Thomas Chohan, MD [facility] Pager [phone]  If 7PM-7AM, please contact night-coverage [email or web address] Password [facility] 09/22/2015, 4:24 PM

## 2015-09-22 NOTE — Progress Notes (Signed)
OT Cancellation Note  Patient Details Name: Tavarus Madriaga MRN: [id number] DOB: September 08, 1955   Cancelled Treatment:    Reason Eval/Treat Not Completed: Patient at procedure or test/ unavailable (Echo). Will attempt to see pt later today if schedule allows.  Redmond Baseman, OTR/L Pager: [phone] 09/22/2015, 9:18 AM

## 2015-09-22 NOTE — Progress Notes (Addendum)
Occupational Therapy Evaluation Patient Details Name: Thomas Sosa MRN: [id number] DOB: 07-Dec-1955 Today's Date: 09/22/2015    History of Present Illness 60 y.o. male admitted for new onset L side weakness. CT (-) for acute infarct, unable to complete MRI due to pacemaker. 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   Clinical Impression   PTA, pt was independent with ADLs and mobility. Pt currently presents with mild L side weakness and balance deficits and required min guard assist for LB ADLs and transfers. SpO2 on 2L [place] at 95%, dropped to 84% on RA for 5 minutes, returned to 2L and SpO2 increased to 89% after 1 minute. Pt plans to d/c home with 24/7 assistance from his significant other and children. Pt will benefit from continued acute OT to increase independence and safety with ADLs and mobility to allow for safe discharge home. No OT follow up recommended.    Follow Up Recommendations  No OT follow up;Supervision/Assistance - 24 hour    Equipment Recommendations  3 in 1 bedside comode    Recommendations for Other Services       Precautions / Restrictions Precautions Precautions: Fall Restrictions Weight Bearing Restrictions: No      Mobility Bed Mobility Overal bed mobility: Modified Independent                Transfers Overall transfer level: Needs assistance Equipment used: Rolling walker (2 wheeled) Transfers: Sit to/from Stand Sit to Stand: Min guard         General transfer comment: Min guard assist for balance upon standing. Pt with LOB x2 to L side due to pain in L foot and decreased sensation in BLE.    Balance Overall balance assessment: Needs assistance Sitting-balance support: No upper extremity supported;Feet supported Sitting balance-Leahy Scale: Good     Standing balance support: No upper extremity  supported;During functional activity Standing balance-Leahy Scale: Poor Standing balance comment: LOB x2 to L side, pt able to regain balance without physical assist                            ADL Overall ADL's : Needs assistance/impaired     Grooming: Wash/dry hands;Wash/dry face;Oral care;Min guard;Standing   Upper Body Bathing: Set up;Sitting   Lower Body Bathing: Min guard;Sit to/from stand   Upper Body Dressing : Supervision/safety;Sitting   Lower Body Dressing: Min guard;Sit to/from stand   Toilet Transfer: Min guard;Cueing for safety;Ambulation;Regular Toilet;RW   Toileting- Water quality scientist and Hygiene: Min guard;Sit to/from stand       Functional mobility during ADLs: Min guard;Rolling walker General ADL Comments: Assist primarily for balance.     Vision Vision Assessment?: Yes Eye Alignment: Within Functional Limits Ocular Range of Motion: Within Functional Limits Alignment/Gaze Preference: Within Defined Limits;Chin down Tracking/Visual Pursuits: Able to track stimulus in all quads without difficulty Saccades: Within functional limits Convergence: Within functional limits Visual Fields: No apparent deficits   Agricultural engineer Tested?: Yes Perception Deficits: Inattention/neglect Inattention/Neglect: Appears intact   Praxis      Pertinent Vitals/Pain Pain Assessment: 0-10 Pain Score: 8  Pain Location: bottom of L foot Pain Descriptors / Indicators: Discomfort Pain Intervention(s): Limited activity within patient's tolerance;Monitored during session;Repositioned     Hand Dominance Right   Extremity/Trunk Assessment Upper Extremity Assessment Upper Extremity Assessment: LUE deficits/detail LUE Deficits / Details:  very mild weakness, overall 4/5; AROM wfl; Sensation intact   Lower Extremity Assessment Lower Extremity Assessment: LLE deficits/detail;RLE deficits/detail RLE Deficits / Details: Impaired sensation  (light touch, pin prick, temperature), pt reports no sensation below knee RLE Sensation: decreased light touch;history of peripheral neuropathy LLE Deficits / Details: very mild weakness, overall 4/5 strength; AROM wfl; Sensation: light touch, temperature, and 2-point discrimation impaired - pt reports no sensation below knee LLE Sensation: history of peripheral neuropathy;decreased light touch   Cervical / Trunk Assessment Cervical / Trunk Assessment: Normal   Communication Communication Communication: No difficulties   Cognition Arousal/Alertness: Awake/alert Behavior During Therapy: WFL for tasks assessed/performed Overall Cognitive Status: Within Functional Limits for tasks assessed                     General Comments  Pt's SpO2 on 2L of O2 [employer] at 95% at rest. Removed supplemental O2 and pt's SpO2 dropped to 84% on RA after 5 minutes of activty; returned to supplemental O2 and SpO2 increased to 89% within 1 minute.     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 Has 10 children, 3 of which live with him currently.    OT Diagnosis: Acute pain   OT Problem List: Decreased strength;Decreased activity tolerance;Impaired balance (sitting and/or standing);Decreased safety awareness;Decreased knowledge of use of DME or AE;Pain;Impaired sensation   OT  Treatment/Interventions: Therapeutic exercise;Self-care/ADL training;Energy conservation;DME and/or AE instruction;Therapeutic activities;Patient/family education;Balance training    OT Goals(Current goals can be found in the care plan section) Acute Rehab OT Goals Patient Stated Goal: to go home today OT Goal Formulation: With patient Time For Goal Achievement: 10/06/15 Potential to Achieve Goals: Good ADL Goals Pt Will Transfer to Toilet: with modified independence;ambulating;regular height toilet Pt Will Perform Toileting - Clothing Manipulation and hygiene: with modified independence;sitting/lateral leans;sit to/from stand Pt Will Perform Tub/Shower Transfer: Tub transfer;with supervision;ambulating;3 in 1;rolling walker Pt/caregiver will Perform Home Exercise Program: Increased strength;Left upper extremity;Independently;With written HEP provided  OT Frequency: Min 2X/week   Barriers to D/C:            Co-evaluation              End of Session Equipment Utilized During Treatment: Gait belt;Rolling walker Nurse Communication: Mobility status  Activity Tolerance: Patient tolerated treatment well Patient left: in chair;with call bell/phone within reach;with chair alarm set   Time: 1110-1135 OT Time Calculation (min): 25 min Charges:  OT General Charges $OT Visit: 1 Procedure OT Evaluation $OT Eval Moderate Complexity: 1 Procedure OT Treatments $Self Care/Home Management : 8-22 mins G-Codes:    Redmond Baseman, OTR/L Pager: [phone] 09/22/2015, 12:27 PM

## 2015-09-22 NOTE — Progress Notes (Signed)
STROKE TEAM PROGRESS NOTE   HISTORY OF PRESENT ILLNESS (per record) Thomas Sosa is an 60 y.o. male history of hypertension, diabetes mellitus, prostate cancer, hyperlipidemia, coronary artery disease stroke and peripheral vascular disease, admitted on 09/16/2015 for hematemesis. Patient was previously treated with Coumadin, but had not taken Coumadin for about 1 month prior to admission. He also had not taken Prilosec for about the same length of time. Neurology consultation was obtained because of complaint of new onset left-sided weakness which occurred overnight. He was last known well at midnight last night. No facial droop is noted. Speech is not changed. He has not been started back on anticoagulation at this point. Gastroenterologist has okayed resumption of anticoagulation, however. CT scan of his head showed no acute intracranial abnormality. CT angiogram shows severe stenosis of distal left vertebral artery and moderately severe startle stenosis of right vertebral artery. Advanced atherosclerotic calcification was noted in the cavernous carotid artery bilaterally with severe stenosis. NIH stroke score at the time of this evaluation was 2. He was LKW at 12:00 AM on 09/20/2015. Patient was not administered IV t-PA secondary to Beyond time window for treatment consideration and minimal deficits.   SUBJECTIVE (INTERVAL HISTORY) No family is at the bedside.  Overall he feels his condition is stable.    OBJECTIVE Temp:  [98.3 F (36.8 C)-99.5 F (37.5 C)] 98.3 F (36.8 C) (06/29 1353) Pulse Rate:  [64-85] 65 (06/29 1353) Cardiac Rhythm:  [-] Normal sinus rhythm (06/29 0702) Resp:  [16-18] 18 (06/29 1353) BP: (119-144)/(58-78) 135/58 mmHg (06/29 1353) SpO2:  [94 %-100 %] 100 % (06/29 1353)  CBC:   Recent Labs Lab 09/21/15 1421 09/22/15 0532  WBC 7.4 8.0  HGB 11.9* 12.4*  HCT 35.3* 37.5*  MCV 88.7 90.1  PLT 209 [id number]    Basic Metabolic Panel:   Recent Labs Lab 09/21/15 1421  09/22/15 0532  NA 136 137  K 3.3* 4.0  CL 99* 102  CO2 29 27  GLUCOSE 286* 267*  BUN 13 13  CREATININE 0.94 0.73  CALCIUM 8.3* 8.5*  MG 1.4* 1.7    Lipid Panel:     Component Value Date/Time   CHOL 127 09/22/2015 0532   TRIG 90 09/22/2015 0532   HDL 36* 09/22/2015 0532   CHOLHDL 3.5 09/22/2015 0532   VLDL 18 09/22/2015 0532   LDLCALC 73 09/22/2015 0532   HgbA1c:  Lab Results  Component Value Date   HGBA1C 11.8* 09/21/2015   Urine Drug Screen: No results found for: LABOPIA, COCAINSCRNUR, LABBENZ, AMPHETMU, THCU, LABBARB    IMAGING  Ct Angio Head W Or Wo Contrast  09/20/2015  CLINICAL DATA:  Left-sided weakness. EXAM: CT ANGIOGRAPHY HEAD AND NECK TECHNIQUE: Multidetector CT imaging of the head and neck was performed using the standard protocol during bolus administration of intravenous contrast. Multiplanar CT image reconstructions and MIPs were obtained to evaluate the vascular anatomy. Carotid stenosis measurements (when applicable) are obtained utilizing NASCET criteria, using the distal internal carotid diameter as the denominator. CONTRAST:  100 mL Isovue 370 IV COMPARISON:  CT head 09/20/2015 FINDINGS: CTA NECK Aortic arch: Significant streak artifact through the chest and lower neck related to left-sided pacemaker and dense contrast in the right subclavian vein contrast injection. This obscures the aortic arch significantly. Proximal great vessels also completely obscured due to artifact. Right carotid system: Atherosclerotic calcification in the right carotid bifurcation without significant stenosis. Left carotid system: Atherosclerotic calcification left carotid bifurcation with mild stenosis estimated 25% diameter stenosis. Left  external carotid artery patent. Vertebral arteries:Proximal vertebral arteries obscured by artifact. The left vertebral artery is dominant. Both vertebral arteries are patent to C1 without significant stenosis. Skeleton: Disc degeneration and  spondylosis in the cervical spine. No fracture or bone lesion. Poor dentition. Other neck: Negative for mass or adenopathy in the neck. CTA HEAD Anterior circulation: Advanced atherosclerotic calcification in the cavernous carotid bilaterally with severe stenosis bilaterally. Estimation of luminal stenosis difficult due to extensive vascular calcification. Supraclinoid internal carotid artery widely patent bilaterally. Anterior and middle cerebral arteries patent bilaterally without significant stenosis. Posterior circulation: Severe calcific stenosis distal left vertebral artery. Moderate calcific stenosis distal right vertebral artery. PICA patent bilaterally. Basilar patent without significant stenosis. AICA, superior cerebellar, posterior cerebral arteries patent bilaterally. Mild irregularity in the posterior cerebral artery bilaterally. Venous sinuses: Patent Anatomic variants: No cerebral aneurysm or vascular malformation Delayed phase: Normal enhancement on delayed imaging. Chronic microvascular ischemic changes in the white matter. IMPRESSION: Mild atherosclerotic calcification in the carotid bifurcation bilaterally without significant stenosis Severe stenosis distal left vertebral artery and moderately severe stenosis distal right vertebral artery at the skullbase. Advanced atherosclerotic calcification in the cavernous carotid artery bilaterally with severe stenosis bilaterally. Electronically Signed   By: Franchot Gallo M.D.   On: 09/20/2015 16:24   Dg Chest 2 View  09/21/2015  CLINICAL DATA:  Left-sided weakness EXAM: CHEST  2 VIEW COMPARISON:  01/23/2015 chest radiograph. FINDINGS: Sternotomy wires appear aligned and intact. Stable configuration of single lead left subclavian ICD. Stable cardiomediastinal silhouette with normal heart size. No pneumothorax. No pleural effusion. No pulmonary edema. No acute consolidative airspace disease. IMPRESSION: No active cardiopulmonary disease. Electronically  Signed   By: Ilona Sorrel M.D.   On: 09/21/2015 08:30   Ct Head Wo Contrast  09/21/2015  CLINICAL DATA:  Left-sided weakness.  Hypertension. EXAM: CT HEAD WITHOUT CONTRAST TECHNIQUE: Contiguous axial images were obtained from the base of the skull through the vertex without intravenous contrast. COMPARISON:  September 20, 2015. FINDINGS: Brain: The ventricles are normal in size and configuration for age. There is no intracranial mass, hemorrhage, extra-axial fluid collection, or midline shift. There is patchy small vessel disease in the centra semiovale bilaterally. Elsewhere gray-white compartments appear normal. No acute infarct is evident. Vascular: There are foci of calcification in the distal vertebral arteries as well as in the cavernous carotid arteries bilaterally. Skull: The bony calvarium appears intact. Sinuses/Orbits: Orbits appear symmetric bilaterally. There is opacification of multiple ethmoid air cells bilaterally. There is milder mucosal thickening in the sphenoid sinuses bilaterally. There is opacification in the superior right maxillary antrum. Other: Mastoid air cells are clear. IMPRESSION: Patchy periventricular small vessel disease. No intracranial mass, hemorrhage, or acute appearing infarct. There are foci of atherosclerotic vascular calcification. There is paranasal sinus disease at multiple sites, most notably in the ethmoid air cells bilaterally. Electronically Signed   By: Lowella Grip III M.D.   On: 09/21/2015 10:09   Ct Angio Neck W Or Wo Contrast  09/20/2015  CLINICAL DATA:  Left-sided weakness. EXAM: CT ANGIOGRAPHY HEAD AND NECK TECHNIQUE: Multidetector CT imaging of the head and neck was performed using the standard protocol during bolus administration of intravenous contrast. Multiplanar CT image reconstructions and MIPs were obtained to evaluate the vascular anatomy. Carotid stenosis measurements (when applicable) are obtained utilizing NASCET criteria, using the distal  internal carotid diameter as the denominator. CONTRAST:  100 mL Isovue 370 IV COMPARISON:  CT head 09/20/2015 FINDINGS: CTA NECK Aortic arch: Significant streak  artifact through the chest and lower neck related to left-sided pacemaker and dense contrast in the right subclavian vein contrast injection. This obscures the aortic arch significantly. Proximal great vessels also completely obscured due to artifact. Right carotid system: Atherosclerotic calcification in the right carotid bifurcation without significant stenosis. Left carotid system: Atherosclerotic calcification left carotid bifurcation with mild stenosis estimated 25% diameter stenosis. Left external carotid artery patent. Vertebral arteries:Proximal vertebral arteries obscured by artifact. The left vertebral artery is dominant. Both vertebral arteries are patent to C1 without significant stenosis. Skeleton: Disc degeneration and spondylosis in the cervical spine. No fracture or bone lesion. Poor dentition. Other neck: Negative for mass or adenopathy in the neck. CTA HEAD Anterior circulation: Advanced atherosclerotic calcification in the cavernous carotid bilaterally with severe stenosis bilaterally. Estimation of luminal stenosis difficult due to extensive vascular calcification. Supraclinoid internal carotid artery widely patent bilaterally. Anterior and middle cerebral arteries patent bilaterally without significant stenosis. Posterior circulation: Severe calcific stenosis distal left vertebral artery. Moderate calcific stenosis distal right vertebral artery. PICA patent bilaterally. Basilar patent without significant stenosis. AICA, superior cerebellar, posterior cerebral arteries patent bilaterally. Mild irregularity in the posterior cerebral artery bilaterally. Venous sinuses: Patent Anatomic variants: No cerebral aneurysm or vascular malformation Delayed phase: Normal enhancement on delayed imaging. Chronic microvascular ischemic changes in the  white matter. IMPRESSION: Mild atherosclerotic calcification in the carotid bifurcation bilaterally without significant stenosis Severe stenosis distal left vertebral artery and moderately severe stenosis distal right vertebral artery at the skullbase. Advanced atherosclerotic calcification in the cavernous carotid artery bilaterally with severe stenosis bilaterally. Electronically Signed   By: Franchot Gallo M.D.   On: 09/20/2015 16:24    PHYSICAL EXAM  frail middle-aged African-American male currently not in distress. . Afebrile. Head is nontraumatic. Neck is supple without bruit.    Cardiac exam no murmur or gallop. Lungs are clear to auscultation. Distal pulses are well felt. Neurological Exam :  Awake alert oriented x 3 normal speech and language. Mild left lower face asymmetry. Tongue midline. Left UE drift. Mild diminished fine finger movements on left. Orbits right over left upper extremity. Mild left grip weak.Mild left hip flexor weakness.. Normal sensation . Normal coordination. ASSESSMENT/PLAN [name] is a 60 y.o. male with history of hypertension, diabetes mellitus, prostate cancer, hyperlipidemia, coronary artery disease stroke and peripheral vascular disease admitted for hematemesis who developed L sided weakness in hospital. He did not receive IV t-PA due to out of the window, minimal deficits.   Stroke:  Possible new R brain stroke not seen on CT  MRI  pacer  CTA  Severe stenosis L VA, R VA, B ICAs  2D Echo  pending   LDL 100  HgbA1c pending  SCDs ordered for VTE prophylaxis Diet heart healthy/carb modified Room service appropriate?: Yes; Fluid consistency:: Thin  aspirin 81 mg daily prior to admission, had been on coumadin but stopped 1 month ago due to financial issues, now on aspirin 325 mg daily. Given spontaneous hematemesis and no documented indication, do not recommend anticoagulation, continue aspirin, low dose can be used for ongoing secondary stroke  prevention.  Patient counseled to be compliant with his antithrombotic medications  Ongoing aggressive stroke risk factor management  Therapy recommendations:  pending   Disposition:  pending   Hypertension  Stable  Long-term BP goal normotensive  Hyperlipidemia  Home meds:  lipitor 80  resumed in hospital  LDL 100, goal < 70  Continue statin at discharge  Diabetes  HgbA1c pending (was  over 11 fall 2016), goal < 7.0  Likely Uncontrolled  Other Stroke Risk Factors  Former Cigarette smoker  Obesity, Body mass index is 30.43 kg/(m^2)., recommend weight loss, diet and exercise as appropriate   Hx stroke/TIA  09/2014 right MCA territory infarcts in the setting of previous R peri-rolandic subcortical and R occipital infarcts 1 week prior, first infarcts thought to be due to small vessel disease, now thought to be embolic, source unknown. Started on warfarin  Coronary artery disease s/p CABG  PAD  Cardiomyopathy, EF 25-30%.   Chronic systolic CHF  Other Active Problems  Hematemesis, possible GIB  Hypokalemia   Prostate cancer   non-sustained VT, prior syncope. ICD implantation by Cardiology  Hospital day # [place] for Pager information 09/22/2015 3:41 PM  I have personally examined this patient, reviewed notes, independently viewed imaging studies, participated in medical decision making and plan of care. I have made any additions or clarifications directly to the above note. Agree with note above.  . Continue aspirin for stroke prevention as patient cannot afford warfarin and presented with hematemesis. Hence is not a good long-term candidate for dual antiplatelet therapy or anticoagulation. Maintain aggressive risk factor modification. Follow-up echocardiogram results. Greater than 50% time during this 15 minute visit was spent on counseling and coordination of care of her stroke risk, prevention and  treatment.Stroke team will sign off. Kindly call for   questions  Antony Contras, MD Medical Director [place] Pager: [phone] 09/22/2015 3:41 PM    To contact Stroke Continuity provider, please refer to [email or web address] After hours, contact General Neurology

## 2015-09-23 DIAGNOSIS — E860 Dehydration: Secondary | ICD-10-CM | POA: Insufficient documentation

## 2015-09-23 LAB — BASIC METABOLIC PANEL
ANION GAP: 6 (ref 5–15)
BUN: 8 mg/dL (ref 6–20)
CALCIUM: 8.5 mg/dL — AB (ref 8.9–10.3)
CHLORIDE: 101 mmol/L (ref 101–111)
CO2: 30 mmol/L (ref 22–32)
Creatinine, Ser: 0.73 mg/dL (ref 0.61–1.24)
GFR calc non Af Amer: >60 mL/min (ref >60)
Glucose, Bld: 124 mg/dL — ABNORMAL HIGH (ref 65–99)
Potassium: 3.3 mmol/L — ABNORMAL LOW (ref 3.5–5.1)
SODIUM: 137 mmol/L (ref 135–145)

## 2015-09-23 LAB — MAGNESIUM: MAGNESIUM: 1.5 mg/dL — AB (ref 1.7–2.4)

## 2015-09-23 LAB — GLUCOSE, CAPILLARY
GLUCOSE-CAPILLARY: 140 mg/dL — AB (ref 65–99)
GLUCOSE-CAPILLARY: 155 mg/dL — AB (ref 65–99)
Glucose-Capillary: 166 mg/dL — ABNORMAL HIGH (ref 65–99)
Glucose-Capillary: 218 mg/dL — ABNORMAL HIGH (ref 65–99)

## 2015-09-23 LAB — CBC
HEMATOCRIT: 34.9 % — AB (ref 39.0–52.0)
HEMOGLOBIN: 11.4 g/dL — AB (ref 13.0–17.0)
MCH: 29.5 pg (ref 26.0–34.0)
MCHC: 32.7 g/dL (ref 30.0–36.0)
MCV: 90.4 fL (ref 78.0–100.0)
Platelets: 259 10*3/uL (ref 150–400)
RBC: 3.86 MIL/uL — ABNORMAL LOW (ref 4.22–5.81)
RDW: 12.8 % (ref 11.5–15.5)
WBC: 6.2 10*3/uL (ref 4.0–10.5)

## 2015-09-23 MED ORDER — ISOSORBIDE DINITRATE 5 MG PO TABS
5.0000 mg | ORAL_TABLET | Freq: Two times a day (BID) | ORAL | Status: DC
  Administered 2015-09-23 – 2015-09-24 (×3): 5 mg via ORAL
  Filled 2015-09-23 (×4): qty 1

## 2015-09-23 MED ORDER — POTASSIUM CHLORIDE CRYS ER 20 MEQ PO TBCR
40.0000 meq | EXTENDED_RELEASE_TABLET | ORAL | Status: AC
  Administered 2015-09-23 (×2): 40 meq via ORAL
  Filled 2015-09-23 (×2): qty 2

## 2015-09-23 MED ORDER — LOSARTAN POTASSIUM 50 MG PO TABS
25.0000 mg | ORAL_TABLET | Freq: Every day | ORAL | Status: DC
  Administered 2015-09-23 – 2015-09-24 (×2): 25 mg via ORAL
  Filled 2015-09-23 (×2): qty 1

## 2015-09-23 MED ORDER — MAGNESIUM SULFATE 4 GM/100ML IV SOLN
4.0000 g | Freq: Once | INTRAVENOUS | Status: AC
  Administered 2015-09-23: 4 g via INTRAVENOUS
  Filled 2015-09-23: qty 100

## 2015-09-23 MED ORDER — ACETAMINOPHEN 325 MG PO TABS
650.0000 mg | ORAL_TABLET | Freq: Four times a day (QID) | ORAL | Status: DC | PRN
Start: 2015-09-23 — End: 2015-09-23
  Administered 2015-09-23: 650 mg via ORAL
  Filled 2015-09-23: qty 2

## 2015-09-23 MED ORDER — OXYCODONE-ACETAMINOPHEN 5-325 MG PO TABS
1.0000 | ORAL_TABLET | Freq: Four times a day (QID) | ORAL | Status: DC | PRN
  Administered 2015-09-23 – 2015-09-24 (×2): 1 via ORAL
  Filled 2015-09-23 (×2): qty 1

## 2015-09-23 MED ORDER — MAGNESIUM OXIDE 400 (241.3 MG) MG PO TABS
400.0000 mg | ORAL_TABLET | Freq: Two times a day (BID) | ORAL | Status: DC
Start: 2015-09-23 — End: 2015-09-24
  Administered 2015-09-23 – 2015-09-24 (×3): 400 mg via ORAL
  Filled 2015-09-23 (×3): qty 1

## 2015-09-23 NOTE — Progress Notes (Signed)
Occupational Therapy Treatment Patient Details Name: Thomas Sosa MRN: [id number] DOB: 1956-03-03 Today's Date: 09/23/2015    History of present illness 60 y.o. male admitted for new onset L side weakness. CT (-) for acute infarct, unable to complete MRI due to pacemaker. 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   OT comments  Pt currently overall supervision for safety with toilet transfer and simulated tub transfer. Pt continues to c/o pain in L foot and back; RN notified. D/c plan remains appropriate. Will continue to follow acutely.   Follow Up Recommendations  No OT follow up;Supervision/Assistance - 24 hour    Equipment Recommendations  3 in 1 bedside comode    Recommendations for Other Services      Precautions / Restrictions Precautions Precautions: Fall Restrictions Weight Bearing Restrictions: No       Mobility Bed Mobility               General bed mobility comments: Pt OOB in chair upon arrival.  Transfers Overall transfer level: Needs assistance Equipment used: Rolling walker (2 wheeled) Transfers: Sit to/from Stand Sit to Stand: Supervision         General transfer comment: Supervision for safety; no physical assist required. No LOB noted with sit to stand x 2.    Balance Overall balance assessment: Needs assistance Sitting-balance support: Feet supported;No upper extremity supported Sitting balance-Leahy Scale: Good     Standing balance support: No upper extremity supported;During functional activity Standing balance-Leahy Scale: Fair                     ADL Overall ADL's : Needs assistance/impaired                         Toilet Transfer: Supervision/safety;Ambulation;Regular Toilet;RW;Grab bars   Toileting- Clothing Manipulation and Hygiene: Supervision/safety;Sit to/from  stand   Tub/ Shower Transfer: Supervision/safety;Tub transfer;Ambulation;Rolling walker Tub/Shower Transfer Details (indicate cue type and reason): Simulated tub transfer; pt able to return demo technique with supervision for safety. Functional mobility during ADLs: Supervision/safety;Rolling walker General ADL Comments: Discussed use of 3 in 1 in tub as a seat. Pt agreeable and reports he wants to get grab bars for tub for safety. Educated pt on home safety; pt verbalized understanding.      Vision                     Perception     Praxis      Cognition   Behavior During Therapy: WFL for tasks assessed/performed Overall Cognitive Status: Within Functional Limits for tasks assessed                       Extremity/Trunk Assessment               Exercises     Shoulder Instructions       General Comments      Pertinent Vitals/ Pain       Pain Assessment: Faces Faces Pain Scale: Hurts even more Pain Location: L foot, back Pain Descriptors / Indicators: Aching;Sharp Pain Intervention(s): Monitored during session;Patient requesting pain meds-RN notified  Home Living  Prior Functioning/Environment              Frequency Min 2X/week     Progress Toward Goals  OT Goals(current goals can now be found in the care plan section)  Progress towards OT goals: Progressing toward goals  Acute Rehab OT Goals Patient Stated Goal: home tomorrow OT Goal Formulation: With patient  Plan Discharge plan remains appropriate    Co-evaluation                 End of Session Equipment Utilized During Treatment: Rolling walker   Activity Tolerance Patient tolerated treatment well   Patient Left in chair;with call bell/phone within reach;with chair alarm set   Nurse Communication Patient requests pain meds        Time: [id number] OT Time Calculation (min): 21 min  Charges: OT General  Charges $OT Visit: 1 Procedure OT Treatments $Self Care/Home Management : 8-22 mins  Binnie Kand M.S., OTR/L Pager: [phone]  09/23/2015, 3:32 PM

## 2015-09-23 NOTE — Progress Notes (Signed)
Tele called, Pt. Had a 5 beat run of V-tach. MD aware, Magnesium and Potassium supplements given. Will continue to monitor.

## 2015-09-23 NOTE — Progress Notes (Signed)
PROGRESS NOTE    Thomas Sosa  [id number] DOB: [birth year] DOA: 09/16/2015 PCP: [name], NP    Brief Narrative:  As written by Dr. Tamala Julian on 6/23 60 year old male history of hypertension, diabetes, prostate cancer status post radioactive seed implantation, CVA on anticoagulation therapy, presented with complaints of nausea and vomiting with dark emesis. Patient stated he had run out of his medications for proximal one month. He also ran out of his omeprazole proximally 3 days ago. His nausea and vomiting started approximately 2 days ago, he has been unable tolerate any food or drink. Of note, patient has been off of his Coumadin for approximately one month as he was unable to afford any of his medications. Admitted for hematemesis possible GI bleed. GI consulted and pending.  Interim History No further hematemesis. Patient with persistent heartburn. EGD done on 6/26 showed erosive esophagitis.  Today patient is lethargic, left side weakness started overnight. Stroke wotk up.    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 Patient subsequently underwent upper endoscopy that showed distal erosive esophagitis. Patient was placed on a PPI and serial CBCs done. Patient's hemoglobin remained stable. Was recommended that patient be on a PPI twice daily for 2 months and then daily on afterwards.  #2 new left-sided weakness/possible new right brain stroke not seen on CT Consent for an acute CVA. Patient with prior history of stroke. Patient had noted to be off  his Coumadin prior to admission. CT head which was done was unremarkable. Patient status post pacemaker placement and a such MRI on obtainable. 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 Fasting lipid panel done had a LDL of 100. Hemoglobin A1c pending. 2-D echo with EF of 45-50% with inferior hypokinesis. AICD wires are present. No source of emboli. Patient was on aspirin prior to admission had been on Coumadin but discontinued one month prior to admission due to financial issues. Patient also presented with hematemesis. Patient has been seen in consultation by neurology who felt that patient did not need anticoagulation at this time and to continue patient on aspirin for secondary stroke prevention. PT/OT/ST. Neurology following and appreciate input and recommendations.  #3 diabetes mellitus type 2 Hemoglobin A1c was 11.20 on 01/20/2015. Hemoglobin A1c is 11.8 on 09/21/2015. CBGs have ranged from 140- 155. Due to financial issues patient was started on NPH. Increased NPH to 20 units twice daily. Continue meal coverage NovoLog 5 units 3 times daily. Sliding scale insulin.  #4 ischemic cardiomyopathy/coronary artery disease status post CABG/chronic systolic CHF Currently stable. Patient euvolemic on examination. Patient denies any chest pain. Continue aspirin, Lipitor, Coreg, hydralazine. Will resume patient's ARB at a low dose as well as patient's Isordil at a low-dose and monitor blood pressure closely.  #5 hyperlipidemia Fasting lipid panel with a LDL of 73. Continue statin.  #6 hypokalemia/hypomagnesemia Replete.  #7 nonsustained V. tach Per nursing patient noted to have a 5 beat run of V. tach. Patient however asymptomatic. She noted to have a potassium  level of 3.3 and a magnesium level of 1.5. Keep magnesium greater than 2. Replete potassium. Follow.    DVT prophylaxis: SCDs Code  Status: Full Family Communication: Updated patient. No family at bedside. Disposition Plan: Hopefully home tomorrow if blood pressure remained stable.   Consultants:   Gastroenterology: Dr. Paulita Fujita 09/18/2015  Neurology: Dr. Nicole Kindred 09/20/2015  Procedures:   CT abdomen and pelvis 09/16/2015  CT head 09/20/2015, 09/21/2015   CT angiogram head and neck 09/20/2015  Chest x-ray 09/21/2015  Abdominal x-ray 09/16/2015  Upper endoscopy 09/19/2015 per Dr. Michail Sermon  2-D echo 09/22/2015    Antimicrobials:   None   Subjective: Patient denies any further hematemesis. No chest pain. No shortness of breath. Patient states left-sided weakness improving. Per nursing patient with 5 beat run of asymptomatic V. tach.  Objective: Filed Vitals:   09/22/15 1742 09/22/15 2216 09/23/15 0125 09/23/15 0513  BP: 156/85 159/82 125/60 126/74  Pulse: 69 70 70 70  Temp: 99 F (37.2 C) 99 F (37.2 C) 98.4 F (36.9 C) 98.3 F (36.8 C)  TempSrc: Oral Oral Oral Oral  Resp: 16 17 16 16   Height:      Weight:      SpO2: 100% 97% 98% 93%    Intake/Output Summary (Last 24 hours) at 09/23/15 1118 Last data filed at 09/23/15 0900  Gross per 24 hour  Intake   1082 ml  Output    450 ml  Net    632 ml   Filed Weights   09/16/15 1823 09/20/15 1900  Weight: 106.595 kg (235 lb) 101.8 kg (224 lb 6.9 oz)    Examination:   General exam: Appears calm and comfortable  Respiratory system: Clear to auscultation. Respiratory effort normal. Cardiovascular system: S1 & S2 heard, RRR. No JVD, murmurs, rubs, gallops or clicks. No pedal edema. Gastrointestinal system: Abdomen is nondistended, soft and nontender. No organomegaly or masses felt. Normal bowel sounds heard. Central nervous system: Alert and oriented. No focal neurological deficits. Extremities: 4/5 left upper extremity and 3-4/5 left lower extremity strength. 5/5 right upper extremity strength and right lower extremity strength. Skin: No  rashes, lesions or ulcers Psychiatry: Judgement and insight appear normal. Mood & affect appropriate.     Data Reviewed: I have personally reviewed following labs and imaging studies  CBC:  Recent Labs Lab 09/16/15 1832  09/18/15 0516  09/19/15 1236 09/20/15 0028 09/21/15 1421 09/22/15 0532 09/23/15 0614  WBC 7.0  --  7.7  --   --   --  7.4 8.0 6.2  HGB 15.8  < > 13.9  < > 14.3 14.2 11.9* 12.4* 11.4*  HCT 44.9  < > 40.6  < > 40.3 40.0 35.3* 37.5* 34.9*  MCV 87.0  --  87.3  --   --   --  88.7 90.1 90.4  PLT 270  --  238  --   --   --  209 211 259  < > = values in this interval not displayed. Basic Metabolic Panel:  Recent Labs Lab 09/18/15 1746 09/20/15 0028 09/21/15 1421 09/22/15 0532 09/23/15 0614 09/23/15 0900  NA 137 133* 136 137 137  --   K 3.0* 3.3* 3.3* 4.0 3.3*  --   CL 100* 96* 99* 102 101  --   CO2 29 28 29 27 30   --   GLUCOSE 157* 196* 286* 267* 124*  --   BUN 12 15 13 13 8   --   CREATININE 0.69 0.83 0.94 0.73 0.73  --  CALCIUM 8.4* 8.5* 8.3* 8.5* 8.5*  --   MG  --   --  1.4* 1.7  --  1.5*   GFR: Estimated Creatinine Clearance: 121.3 mL/min (by C-G formula based on Cr of 0.73). Liver Function Tests:  Recent Labs Lab 09/16/15 1923 09/21/15 1421  AST 13* 9*  ALT 11* 8*  ALKPHOS 80 61  BILITOT 1.3* 0.6  PROT 8.2* 6.6  ALBUMIN 3.4* 2.5*    Recent Labs Lab 09/16/15 1923  LIPASE 17   No results for input(s): AMMONIA in the last 168 hours. Coagulation Profile:  Recent Labs Lab 09/16/15 1923  INR 1.11   Cardiac Enzymes:  Recent Labs Lab 09/16/15 2354 09/17/15 0615 09/17/15 1042  TROPONINI 0.05* 0.06* 0.06*   BNP (last 3 results) No results for input(s): PROBNP in the last 8760 hours. HbA1C:  Recent Labs  09/21/15 1421  HGBA1C 11.8*   CBG:  Recent Labs Lab 09/22/15 0757 09/22/15 1154 09/22/15 1640 09/22/15 2144 09/23/15 0753  GLUCAP 246* 171* 137* 165* 140*   Lipid Profile:  Recent Labs  09/22/15 0532  CHOL  127  HDL 36*  LDLCALC 73  TRIG 90  CHOLHDL 3.5   Thyroid Function Tests: No results for input(s): TSH, T4TOTAL, FREET4, T3FREE, THYROIDAB in the last 72 hours. Anemia Panel: No results for input(s): VITAMINB12, FOLATE, FERRITIN, TIBC, IRON, RETICCTPCT in the last 72 hours. Sepsis Labs: No results for input(s): PROCALCITON, LATICACIDVEN in the last 168 hours.  No results found for this or any previous visit (from the past 240 hour(s)).       Radiology Studies: No results found.      Scheduled Meds: . amitriptyline  50 mg Oral QHS  . aspirin EC  325 mg Oral Daily  . atorvastatin  80 mg Oral q1800  . carvedilol  12.5 mg Oral BID  . hydrALAZINE  25 mg Oral BID  . insulin aspart  0-15 Units Subcutaneous TID WC  . insulin aspart  5 Units Subcutaneous TID WC  . insulin NPH Human  20 Units Subcutaneous BID AC & HS  . isosorbide dinitrate  5 mg Oral BID  . losartan  25 mg Oral Daily  . magnesium oxide  400 mg Oral BID  . magnesium sulfate 1 - 4 g bolus IVPB  4 g Intravenous Once  . pantoprazole  40 mg Oral BID AC  . potassium chloride  40 mEq Oral Q4H   Continuous Infusions:     LOS: 2 days    Time spent: 66 minutes    Auriah Hollings, MD [facility] Pager [phone]  If 7PM-7AM, please contact night-coverage [email or web address] Password [email or web address] 09/23/2015, 11:18 AM

## 2015-09-24 DIAGNOSIS — E1142 Type 2 diabetes mellitus with diabetic polyneuropathy: Secondary | ICD-10-CM

## 2015-09-24 DIAGNOSIS — I639 Cerebral infarction, unspecified: Secondary | ICD-10-CM

## 2015-09-24 LAB — BASIC METABOLIC PANEL
Anion gap: 7 (ref 5–15)
BUN: 10 mg/dL (ref 6–20)
CALCIUM: 8.6 mg/dL — AB (ref 8.9–10.3)
CO2: 29 mmol/L (ref 22–32)
CREATININE: 0.75 mg/dL (ref 0.61–1.24)
Chloride: 102 mmol/L (ref 101–111)
GFR calc non Af Amer: >60 mL/min (ref >60)
GLUCOSE: 115 mg/dL — AB (ref 65–99)
Potassium: 4.7 mmol/L (ref 3.5–5.1)
Sodium: 138 mmol/L (ref 135–145)

## 2015-09-24 LAB — HEMOGLOBIN AND HEMATOCRIT, BLOOD
HEMATOCRIT: 34.1 % — AB (ref 39.0–52.0)
Hemoglobin: 10.9 g/dL — ABNORMAL LOW (ref 13.0–17.0)

## 2015-09-24 LAB — GLUCOSE, CAPILLARY
GLUCOSE-CAPILLARY: 191 mg/dL — AB (ref 65–99)
Glucose-Capillary: 144 mg/dL — ABNORMAL HIGH (ref 65–99)

## 2015-09-24 LAB — MAGNESIUM: Magnesium: 1.7 mg/dL (ref 1.7–2.4)

## 2015-09-24 MED ORDER — ATORVASTATIN CALCIUM 80 MG PO TABS: 80.0000 mg | ORAL_TABLET | Freq: Every day | ORAL | Status: DC

## 2015-09-24 MED ORDER — GABAPENTIN 600 MG PO TABS: 900.0000 mg | ORAL_TABLET | Freq: Three times a day (TID) | ORAL | Status: DC

## 2015-09-24 MED ORDER — AMITRIPTYLINE HCL 50 MG PO TABS: 50.0000 mg | ORAL_TABLET | Freq: Every day | ORAL | Status: DC

## 2015-09-24 MED ORDER — PANTOPRAZOLE SODIUM 40 MG PO TBEC: 40.0000 mg | DELAYED_RELEASE_TABLET | Freq: Two times a day (BID) | ORAL | Status: DC

## 2015-09-24 MED ORDER — HYDRALAZINE HCL 25 MG PO TABS: 25.0000 mg | ORAL_TABLET | Freq: Two times a day (BID) | ORAL | Status: AC

## 2015-09-24 MED ORDER — OXYCODONE-ACETAMINOPHEN 5-325 MG PO TABS: 1.0000 | ORAL_TABLET | Freq: Three times a day (TID) | ORAL | Status: DC | PRN

## 2015-09-24 MED ORDER — GLIPIZIDE ER 5 MG PO TB24: 5.0000 mg | ORAL_TABLET | Freq: Every day | ORAL | Status: DC

## 2015-09-24 MED ORDER — OXYCODONE-ACETAMINOPHEN 5-325 MG PO TABS
1.0000 | ORAL_TABLET | Freq: Three times a day (TID) | ORAL | Status: DC | PRN
Start: 2015-09-24 — End: 2015-09-24

## 2015-09-24 MED ORDER — ATORVASTATIN CALCIUM 80 MG PO TABS: 80.0000 mg | ORAL_TABLET | Freq: Every day | ORAL | Status: AC

## 2015-09-24 MED ORDER — HYDRALAZINE HCL 25 MG PO TABS: 25.0000 mg | ORAL_TABLET | Freq: Two times a day (BID) | ORAL | Status: DC

## 2015-09-24 MED ORDER — MAGNESIUM OXIDE 400 (241.3 MG) MG PO TABS
400.0000 mg | ORAL_TABLET | Freq: Two times a day (BID) | ORAL | Status: DC
Start: 2015-09-24 — End: 2015-09-24

## 2015-09-24 MED ORDER — ASPIRIN 325 MG PO TBEC: 325.0000 mg | DELAYED_RELEASE_TABLET | Freq: Every day | ORAL | Status: AC

## 2015-09-24 MED ORDER — ISOSORBIDE DINITRATE 5 MG PO TABS: 5.0000 mg | ORAL_TABLET | Freq: Two times a day (BID) | ORAL | Status: DC

## 2015-09-24 MED ORDER — CARVEDILOL 12.5 MG PO TABS: 12.5000 mg | ORAL_TABLET | Freq: Two times a day (BID) | ORAL | Status: DC

## 2015-09-24 MED ORDER — MAGNESIUM SULFATE 4 GM/100ML IV SOLN
4.0000 g | Freq: Once | INTRAVENOUS | Status: AC
  Administered 2015-09-24: 4 g via INTRAVENOUS
  Filled 2015-09-24: qty 100

## 2015-09-24 MED ORDER — INSULIN REGULAR HUMAN 100 UNIT/ML IJ SOLN: 5.0000 [IU] | Freq: Three times a day (TID) | INTRAMUSCULAR | Status: DC

## 2015-09-24 MED ORDER — INSULIN NPH ISOPHANE & REGULAR (70-30) 100 UNIT/ML ~~LOC~~ SUSP: 20.0000 [IU] | Freq: Two times a day (BID) | SUBCUTANEOUS | Status: DC

## 2015-09-24 MED ORDER — GLIPIZIDE ER 5 MG PO TB24: 5.0000 mg | ORAL_TABLET | Freq: Every day | ORAL | Status: AC

## 2015-09-24 MED ORDER — CARVEDILOL 12.5 MG PO TABS: 12.5000 mg | ORAL_TABLET | Freq: Two times a day (BID) | ORAL | Status: AC

## 2015-09-24 MED ORDER — INSULIN REGULAR HUMAN 100 UNIT/ML IJ SOLN: 5.0000 [IU] | Freq: Three times a day (TID) | INTRAMUSCULAR | Status: AC

## 2015-09-24 MED ORDER — AMITRIPTYLINE HCL 50 MG PO TABS: 50.0000 mg | ORAL_TABLET | Freq: Every day | ORAL | Status: AC

## 2015-09-24 MED ORDER — MAGNESIUM OXIDE 400 (241.3 MG) MG PO TABS: 400.0000 mg | ORAL_TABLET | Freq: Two times a day (BID) | ORAL | Status: DC

## 2015-09-24 MED ORDER — LOSARTAN POTASSIUM 25 MG PO TABS: 25.0000 mg | ORAL_TABLET | Freq: Every day | ORAL | Status: AC

## 2015-09-24 MED ORDER — INSULIN NPH ISOPHANE & REGULAR (70-30) 100 UNIT/ML ~~LOC~~ SUSP: 20.0000 [IU] | Freq: Two times a day (BID) | SUBCUTANEOUS | Status: AC

## 2015-09-24 MED ORDER — LOSARTAN POTASSIUM 25 MG PO TABS: 25.0000 mg | ORAL_TABLET | Freq: Every day | ORAL | Status: DC

## 2015-09-24 MED ORDER — ISOSORBIDE DINITRATE 5 MG PO TABS: 5.0000 mg | ORAL_TABLET | Freq: Two times a day (BID) | ORAL | Status: AC

## 2015-09-24 NOTE — Discharge Summary (Signed)
Physician Discharge Summary  Thomas Sosa [id number] DOB: 27-Apr-1955 DOA: 09/16/2015  PCP: [name], NP  Admit date: 09/16/2015 Discharge date: 09/24/2015  Time spent: 65 minutes  Recommendations for Outpatient Follow-up:  1. Follow-up with PCP in 1-2 weeks. On follow-up patient's diabetes and need to be reassessed. Patient will need a CBC done to follow-up on H&H. Patient needs a basic metabolic profile and magnesium level done to follow-up on electrolytes and renal function.   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 male with medical history significant of HTN, DM 2, prostate cancer s/p radioactive seed implantation, CAD, tobacco abuse, GERD, CVA previously on anticoagulation therapy; who presented with complaints of nausea and vomiting over the last 2 days. Since start of symptoms patient noted that his vomitus has been dark black in color like coffee grounds. He's had at least 4 episodes 1 day prior to admission and 4 episodes on day of admission. He had not tried anything to relieve symptoms as he is out of all of his medications. He has been without most of his medications over the last month except for insulin, omeprazole, and gabapentin. He stated that he ran out of these 3 medications approximately 3 days prior to admission, and does not have the money to fill any prescriptions that he may be given at this  time. He stated that he has pretty severe acid reflux and has just been drinking water over the last 2 days as he's not been able to tolerate any food. Associated symptoms include a constant epigastric and left side abdominal pain, lightheadedness with changes in position, urinary frequency, and constipation(last bowel movement 4 days ago). Denied having any severe shortness of breath, open wounds, fever, or headache. ED Course: Upon admission into the emergency department patient was evaluated and seen to have at least one episode of emesis while in the ED. Patient was seen to be afebrile, with pulse up to 103, blood pressure 207/130, no other vital signs relatively within normal limits. Lab work was unremarkable except for chloride 96 and blood glucose of 277. Patient's emesis was not initially guaiac and the specimen was started away. He was placed on a Protonix drip and given 2 L of normal saline IV fluids along with 20 mg of hydralazine to control his blood pressures.[name]  Hospital Course:  #1 upper GI bleed/hematemesis/erosive esophagitis Patient presented with hematemesis and seen in consultation by gastroenterology. Patient subsequently underwent upper endoscopy that showed distal erosive esophagitis. Patient was placed on a PPI and serial CBCs done. Patient's hemoglobin remained stable. Was recommended that patient be on a PPI twice daily for 2 months and then daily on afterwards.  #2 new left-sided weakness/possible new right brain stroke not seen on CT Concern for an acute CVA. Patient with prior history of stroke. Patient had noted to be off his Coumadin prior to admission. CT head which was  done was unremarkable. Patient status post pacemaker placement and a such MRI on obtainable. 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 Fasting lipid panel done had a LDL of 100. Hemoglobin A1c pending.  2-D echo with EF of 45-50% with inferior hypokinesis. AICD wires are present. No source of emboli. Patient was on aspirin prior to admission had been on Coumadin but discontinued one month prior to admission due to financial issues. Patient also presented with hematemesis. Patient has been seen in consultation by neurology who felt that patient did not need anticoagulation at this time and to continue patient on aspirin for secondary stroke prevention. PT/OT/ST. Outpatient follow-up with neurology.  #3 diabetes mellitus type 2 Hemoglobin A1c was 11.20 on 01/20/2015. Hemoglobin A1c is 11.8 on 09/21/2015. CBGs have ranged from 140- 155. Due to financial issues patient was started on NPH. NPH dose was adjusted to 20 units twice daily and patient was also maintained on meal coverage insulin with NovoLog as well as sliding scale insulin. Patient's CBGs improved significantly and he'll follow-up with PCP as outpatient.   #4 ischemic cardiomyopathy/coronary artery disease status post CABG/chronic systolic CHF Currently stable. Patient euvolemic on examination. Patient denied any chest pain. Continued on aspirin, Lipitor, Coreg, hydralazine. Patient's ARB as well as Isordil were resumed at low doses and patient's blood pressure was able to tolerate them. Outpatient follow-up.   #5 hyperlipidemia Fasting lipid panel with a LDL of 73. Continued on a statin.  #6 hypokalemia/hypomagnesemia Repleted.  #7 nonsustained V. tach Per nursing patient noted to have a 5 beat run of V. tach. Patient however asymptomatic. She noted to have a potassium level of 3.3 and a magnesium level of 1.5. Patient's magn [place] potassium was repleted with resolution of vtach. Patient had no further runs of V. tach.     Procedures:  CT abdomen and pelvis 09/16/2015  CT head 09/20/2015, 09/21/2015   CT angiogram head and neck 09/20/2015  Chest x-ray 09/21/2015  Abdominal x-ray 09/16/2015  Upper endoscopy 09/19/2015 per  Dr. Michail Sermon  2-D echo 09/22/2015  Consultations:  Gastroenterology: Dr. Paulita Fujita 09/18/2015  Neurology: Dr. Nicole Kindred 09/20/2015  Discharge Exam: Filed Vitals:   09/24/15 0513 09/24/15 1025  BP: 120/60 146/85  Pulse: 66 72  Temp: 97.9 F (36.6 C) 98.4 F (36.9 C)  Resp: 16 14    General: NAD Cardiovascular: RRR Respiratory: CTAB  Discharge Instructions   Discharge Instructions    Diet Carb Modified    Complete by:  As directed      Discharge instructions    Complete by:  As directed   Follow up with [name], NP in 1-2 weeks     Increase activity slowly    Complete by:  As directed           Discharge Medication List as of 09/24/2015 12:58 PM    START taking these medications   Details  insulin NPH-regular Human (NOVOLIN 70/30) (70-30) 100 UNIT/ML injection Inject 20 Units into the skin 2 (two) times daily with a meal., Starting 09/24/2015, Until Discontinued, Print    insulin regular (NOVOLIN R) 100 units/mL injection Inject 0.05 mLs (5 Units total) into the skin 3 (three) times daily before meals., Starting 09/24/2015, Until Discontinued, Print    magnesium oxide (MAG-OX) 400 (241.3 Mg) MG tablet Take 1 tablet (400 mg total) by mouth 2 (two) times daily., Starting 09/24/2015, Until Discontinued, No Print  pantoprazole (PROTONIX) 40 MG tablet Take 1 tablet (40 mg total) by mouth 2 (two) times daily before a meal. Take 2 times daily x 2 months, then 1 time daily., Starting 09/24/2015, Until Discontinued, Print      CONTINUE these medications which have CHANGED   Details  aspirin EC 325 MG EC tablet Take 1 tablet (325 mg total) by mouth daily., Starting 09/24/2015, Until Discontinued, No Print    amitriptyline (ELAVIL) 50 MG tablet Take 1 tablet (50 mg total) by mouth at bedtime., Starting 09/24/2015, Until Discontinued, Print    atorvastatin (LIPITOR) 80 MG tablet Take 1 tablet (80 mg total) by mouth daily., Starting 09/24/2015, Until Discontinued, Print    carvedilol  (COREG) 12.5 MG tablet Take 1 tablet (12.5 mg total) by mouth 2 (two) times daily., Starting 09/24/2015, Until Discontinued, Print    gabapentin (NEURONTIN) 600 MG tablet Take 1.5 tablets (900 mg total) by mouth 3 (three) times daily. Must have office visit for refills, Starting 09/24/2015, Until Discontinued, Print    glipiZIDE (GLUCOTROL XL) 5 MG 24 hr tablet Take 1 tablet (5 mg total) by mouth daily with breakfast., Starting 09/24/2015, Until Discontinued, Print    hydrALAZINE (APRESOLINE) 25 MG tablet Take 1 tablet (25 mg total) by mouth 2 (two) times daily., Starting 09/24/2015, Until Discontinued, Print    isosorbide dinitrate (ISORDIL) 5 MG tablet Take 1 tablet (5 mg total) by mouth 2 (two) times daily., Starting 09/24/2015, Until Discontinued, Print    losartan (COZAAR) 25 MG tablet Take 1 tablet (25 mg total) by mouth daily., Starting 09/24/2015, Until Discontinued, Print    oxyCODONE-acetaminophen (PERCOCET/ROXICET) 5-325 MG tablet Take 1 tablet by mouth every 8 (eight) hours as needed for moderate pain or severe pain., Starting 09/24/2015, Until Discontinued, Print      CONTINUE these medications which have NOT CHANGED   Details  polyethylene glycol (MIRALAX / GLYCOLAX) packet Take 17 g by mouth 2 (two) times daily., Starting 04/27/2015, Until Discontinued, Normal    methocarbamol (ROBAXIN) 500 MG tablet Take 1 tablet (500 mg total) by mouth 2 (two) times daily., Starting 09/14/2015, Until Discontinued, Print    ONE TOUCH ULTRA TEST test strip TEST four times a day, Normal    traMADol (ULTRAM) 50 MG tablet Take 2 tablets (100 mg total) by mouth every 6 (six) hours as needed for moderate pain., Starting 10/14/2014, Until Discontinued, Print      STOP taking these medications     insulin detemir (LEVEMIR) 100 UNIT/ML injection      omeprazole (PRILOSEC) 20 MG capsule      warfarin (COUMADIN) 5 MG tablet      HYDROcodone-acetaminophen (NORCO/VICODIN) 5-325 MG tablet      insulin aspart  (NOVOLOG) 100 UNIT/ML injection      penicillin v potassium (VEETID) 500 MG tablet        Allergies  Allergen Reactions  . Lisinopril Cough   Follow-up Information    Please follow up.   Why:  f/u with PCP in 1-2 weeks.      Follow up with SETHI,PRAMOD, MD In 2 months.   Specialties:  Neurology, Radiology   Contact information:   [address] [place] [address] [phone]        The results of significant diagnostics from this hospitalization (including imaging, microbiology, ancillary and laboratory) are listed below for reference.    Significant Diagnostic Studies: Ct Angio Head W Or Wo Contrast  09/20/2015  CLINICAL DATA:  Left-sided weakness. EXAM:  CT ANGIOGRAPHY HEAD AND NECK TECHNIQUE: Multidetector CT imaging of the head and neck was performed using the standard protocol during bolus administration of intravenous contrast. Multiplanar CT image reconstructions and MIPs were obtained to evaluate the vascular anatomy. Carotid stenosis measurements (when applicable) are obtained utilizing NASCET criteria, using the distal internal carotid diameter as the denominator. CONTRAST:  100 mL Isovue 370 IV COMPARISON:  CT head 09/20/2015 FINDINGS: CTA NECK Aortic arch: Significant streak artifact through the chest and lower neck related to left-sided pacemaker and dense contrast in the right subclavian vein contrast injection. This obscures the aortic arch significantly. Proximal great vessels also completely obscured due to artifact. Right carotid system: Atherosclerotic calcification in the right carotid bifurcation without significant stenosis. Left carotid system: Atherosclerotic calcification left carotid bifurcation with mild stenosis estimated 25% diameter stenosis. Left external carotid artery patent. Vertebral arteries:Proximal vertebral arteries obscured by artifact. The left vertebral artery is dominant. Both vertebral arteries are patent to C1 without significant  stenosis. Skeleton: Disc degeneration and spondylosis in the cervical spine. No fracture or bone lesion. Poor dentition. Other neck: Negative for mass or adenopathy in the neck. CTA HEAD Anterior circulation: Advanced atherosclerotic calcification in the cavernous carotid bilaterally with severe stenosis bilaterally. Estimation of luminal stenosis difficult due to extensive vascular calcification. Supraclinoid internal carotid artery widely patent bilaterally. Anterior and middle cerebral arteries patent bilaterally without significant stenosis. Posterior circulation: Severe calcific stenosis distal left vertebral artery. Moderate calcific stenosis distal right vertebral artery. PICA patent bilaterally. Basilar patent without significant stenosis. AICA, superior cerebellar, posterior cerebral arteries patent bilaterally. Mild irregularity in the posterior cerebral artery bilaterally. Venous sinuses: Patent Anatomic variants: No cerebral aneurysm or vascular malformation Delayed phase: Normal enhancement on delayed imaging. Chronic microvascular ischemic changes in the white matter. IMPRESSION: Mild atherosclerotic calcification in the carotid bifurcation bilaterally without significant stenosis Severe stenosis distal left vertebral artery and moderately severe stenosis distal right vertebral artery at the skullbase. Advanced atherosclerotic calcification in the cavernous carotid artery bilaterally with severe stenosis bilaterally. Electronically Signed   By: Franchot Gallo M.D.   On: 09/20/2015 16:24   Dg Chest 2 View  09/21/2015  CLINICAL DATA:  Left-sided weakness EXAM: CHEST  2 VIEW COMPARISON:  01/23/2015 chest radiograph. FINDINGS: Sternotomy wires appear aligned and intact. Stable configuration of single lead left subclavian ICD. Stable cardiomediastinal silhouette with normal heart size. No pneumothorax. No pleural effusion. No pulmonary edema. No acute consolidative airspace disease. IMPRESSION: No active  cardiopulmonary disease. Electronically Signed   By: Ilona Sorrel M.D.   On: 09/21/2015 08:30   Dg Cervical Spine Complete  09/14/2015  CLINICAL DATA:  [place] 4 days ago, neck pain EXAM: CERVICAL SPINE - COMPLETE 4+ VIEW COMPARISON:  None; correlation CT angio neck 10/11/2014 FINDINGS: Prevertebral soft tissues normal thickness. Osseous mineralization normal. Disc space narrowing with endplate spur formation C2-C3 through C5-C6. Vertebral body heights maintained without fracture or bone destruction. Minimal retrolisthesis C3-C4 and C4-C5, grossly stable. Encroachment upon cervical neural foramina at multiple levels bilaterally greatest RIGHT C3-C4 and LEFT C5-C6. Atherosclerotic calcifications of the carotid systems bilaterally. C1-C2 alignment normal. 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 BILATERAL carotid arterial calcifications. Electronically Signed   By: Lavonia Dana M.D.   On: 09/14/2015 14:44   Dg Shoulder Right  09/14/2015  CLINICAL DATA:  [place] 4 days ago, RIGHT shoulder pain EXAM: RIGHT SHOULDER - 2+ VIEW COMPARISON:  None FINDINGS: Osseous mineralization normal. AC joint  alignment normal. No acute fracture, dislocation or bone destruction. Visualized ribs unremarkable. Pacemaker leads and prior median sternotomy noted. IMPRESSION: Normal exam. Electronically Signed   By: Lavonia Dana M.D.   On: 09/14/2015 14:50   Dg Elbow Complete Right  09/14/2015  CLINICAL DATA:  [place] 4 days ago, RIGHT elbow pain EXAM: RIGHT ELBOW - COMPLETE 3+ VIEW COMPARISON:  None FINDINGS: Osseous mineralization normal. Joint space narrowing RIGHT elbow predominately epitrochlear joint. No acute fracture, dislocation or bone destruction. Small amount of chondrocalcinosis present. No definite joint effusion. IMPRESSION: Degenerative changes and minimal chondrocalcinosis question CPPD. No acute fracture dislocation. Electronically Signed   By: Lavonia Dana M.D.   On: 09/14/2015 14:51   Dg Abd 1 View  09/16/2015  CLINICAL DATA:  Acute onset of coffee-ground emesis and mid abdominal pain. Initial encounter. EXAM: ABDOMEN - 1 VIEW COMPARISON:  None. FINDINGS: The visualized bowel gas pattern is unremarkable. Scattered air and stool filled loops of colon are seen; no abnormal dilatation of small bowel loops is seen to suggest small bowel obstruction. No free intra-abdominal air is identified, though evaluation for free air is limited on a single supine view. The visualized osseous structures are within normal limits; the sacroiliac joints are unremarkable in appearance. Brachytherapy seeds are partially imaged at the prostate bed. IMPRESSION: Unremarkable bowel gas pattern; no free intra-abdominal air seen. Electronically Signed   By: Garald Balding M.D.   On: 09/16/2015 19:42   Ct Head Wo Contrast  09/21/2015  CLINICAL DATA:  Left-sided weakness.  Hypertension. EXAM: CT HEAD WITHOUT CONTRAST TECHNIQUE: Contiguous axial images were obtained from the base of the skull through the vertex without intravenous contrast. COMPARISON:  September 20, 2015. FINDINGS: Brain: The ventricles are normal in size and configuration for age. There is no intracranial mass, hemorrhage, extra-axial fluid collection, or midline shift. There is patchy small vessel disease in the centra semiovale bilaterally. Elsewhere gray-white compartments appear normal. No acute infarct is evident. Vascular: There are foci of calcification in the distal vertebral arteries as well as in the cavernous carotid arteries bilaterally. Skull: The bony calvarium appears intact. Sinuses/Orbits: Orbits appear symmetric bilaterally. There is opacification of multiple ethmoid air cells bilaterally. There is milder mucosal thickening in the sphenoid sinuses bilaterally. There is opacification in the superior right maxillary antrum. Other: Mastoid air cells are clear. IMPRESSION: Patchy periventricular small vessel  disease. No intracranial mass, hemorrhage, or acute appearing infarct. There are foci of atherosclerotic vascular calcification. There is paranasal sinus disease at multiple sites, most notably in the ethmoid air cells bilaterally. Electronically Signed   By: Lowella Grip III M.D.   On: 09/21/2015 10:09   Ct Head Wo Contrast  09/20/2015  CLINICAL DATA:  Acute encephalopathy. EXAM: CT HEAD WITHOUT CONTRAST TECHNIQUE: Contiguous axial images were obtained from the base of the skull through the vertex without intravenous contrast. COMPARISON:  01/16/2015 FINDINGS: Mild chronic small vessel disease throughout the deep white matter. No acute intracranial abnormality. Specifically, no hemorrhage, hydrocephalus, mass lesion, acute infarction, or significant intracranial injury. No acute calvarial abnormality. Mucosal thickening in the ethmoid air cells. No visible air-fluid levels. Mastoid air cells are clear. 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 No acute intracranial abnormality. Chronic ethmoid sinusitis. Electronically Signed   By: Rolm Baptise M.D.   On: 09/20/2015 11:39   Ct Angio Neck W Or Wo Contrast  09/20/2015  CLINICAL DATA:  Left-sided weakness. EXAM: CT ANGIOGRAPHY HEAD AND NECK TECHNIQUE: Multidetector CT  imaging of the head and neck was performed using the standard protocol during bolus administration of intravenous contrast. Multiplanar CT image reconstructions and MIPs were obtained to evaluate the vascular anatomy. Carotid stenosis measurements (when applicable) are obtained utilizing NASCET criteria, using the distal internal carotid diameter as the denominator. CONTRAST:  100 mL Isovue 370 IV COMPARISON:  CT head 09/20/2015 FINDINGS: CTA NECK Aortic arch: Significant streak artifact through the chest and lower neck related to left-sided pacemaker and dense contrast in the right subclavian vein contrast injection. This obscures the aortic arch  significantly. Proximal great vessels also completely obscured due to artifact. Right carotid system: Atherosclerotic calcification in the right carotid bifurcation without significant stenosis. Left carotid system: Atherosclerotic calcification left carotid bifurcation with mild stenosis estimated 25% diameter stenosis. Left external carotid artery patent. Vertebral arteries:Proximal vertebral arteries obscured by artifact. The left vertebral artery is dominant. Both vertebral arteries are patent to C1 without significant stenosis. Skeleton: Disc degeneration and spondylosis in the cervical spine. No fracture or bone lesion. Poor dentition. Other neck: Negative for mass or adenopathy in the neck. CTA HEAD Anterior circulation: Advanced atherosclerotic calcification in the cavernous carotid bilaterally with severe stenosis bilaterally. Estimation of luminal stenosis difficult due to extensive vascular calcification. Supraclinoid internal carotid artery widely patent bilaterally. Anterior and middle cerebral arteries patent bilaterally without significant stenosis. Posterior circulation: Severe calcific stenosis distal left vertebral artery. Moderate calcific stenosis distal right vertebral artery. PICA patent bilaterally. Basilar patent without significant stenosis. AICA, superior cerebellar, posterior cerebral arteries patent bilaterally. Mild irregularity in the posterior cerebral artery bilaterally. Venous sinuses: Patent Anatomic variants: No cerebral aneurysm or vascular malformation Delayed phase: Normal enhancement on delayed imaging. Chronic microvascular ischemic changes in the white matter. IMPRESSION: Mild atherosclerotic calcification in the carotid bifurcation bilaterally without significant stenosis Severe stenosis distal left vertebral artery and moderately severe stenosis distal right vertebral artery at the skullbase. Advanced atherosclerotic calcification in the cavernous carotid artery bilaterally  with severe stenosis bilaterally. Electronically Signed   By: Franchot Gallo M.D.   On: 09/20/2015 16:24   Ct Abdomen Pelvis W Contrast  09/16/2015  CLINICAL DATA:  Vomiting coffee ground emesis, epigastric pain EXAM: CT ABDOMEN AND PELVIS WITH CONTRAST TECHNIQUE: Multidetector CT imaging of the abdomen and pelvis was performed using the standard protocol following bolus administration of intravenous contrast. CONTRAST:  157mL ISOVUE-300 IOPAMIDOL (ISOVUE-300) INJECTION 61% COMPARISON:  CT chest 10/04/2014 FINDINGS: Lower chest: Lung bases are unremarkable. Heart size within normal limits. No pericardial effusion. There is circumferential thickening of distal esophageal wall up to 1.3 cm. Esophagitis or gastroesophageal reflux cannot be excluded. Clinical correlation is necessary. Hepatobiliary: Enhanced liver shows no biliary ductal dilatation. No solid hepatic mass. No calcified gallstones are noted within contracted gallbladder. Pancreas: Enhanced pancreas is unremarkable. Spleen: Enhanced spleen is unremarkable. Adrenals/Urinary Tract: No adrenal gland mass. Enhanced kidneys are symmetrical in size. No hydronephrosis or hydroureter. Delayed renal images shows bilateral renal symmetrical excretion. Bilateral visualized proximal ureter is unremarkable. The urinary bladder is moderate distended. Stomach/Bowel: There is no gastric outlet obstruction. No small bowel obstruction. No thickened or dilated small bowel loops. Normal appendix is noted in axial image 55. There is no pericecal inflammation. No colonic obstruction. Few diverticula are noted descending colon. No evidence of acute diverticulitis. No distal colonic obstruction. Vascular/Lymphatic: Atherosclerotic plaques and calcifications are noted abdominal aorta. Atherosclerotic calcifications are noted SMA. Atherosclerotic calcifications are noted celiac trunk origin and SMA origin. Atherosclerotic plaques and calcifications are noted bilateral common  iliac arteries. 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 Atherosclerotic calcifications are noted bilateral femoral artery. Reproductive: Multiple radiation seeds are noted in prostate gland region. No pelvic mass is noted. No pelvic adenopathy. No inguinal adenopathy. Other: There is no evidence of ascites or free abdominal air. Musculoskeletal: No destructive bony lesions are noted. Sagittal images of the spine shows degenerative changes lower thoracic spine. Mild degenerative changes lumbar spine. No destructive bony lesions are noted within pelvis. Mild degenerative changes bilateral SI joints. IMPRESSION: 1. There is significant thickening of distal esophageal wall measures up to 1.3 cm thickness. Esophagitis or gastroesophageal reflux cannot be excluded. Clinical correlation is necessary. 2. No small bowel obstruction. 3. Normal appendix.  No pericecal inflammation. 4. No hydronephrosis or hydroureter. 5. Atherosclerotic vascular calcifications are noted. No aortic aneurysm. 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 Right common iliac artery measures 2.3 cm in diameter. Left common iliac artery measures 2.2 cm in diameter. 6. Radiation seeds are noted in prostate gland region. 7. Degenerative changes thoracolumbar spine. Electronically Signed   By: Lahoma Crocker M.D.   On: 09/16/2015 21:34    Microbiology: No results found for this or any previous visit (from the past 240 hour(s)).   Labs: Basic Metabolic Panel:  Recent Labs Lab 09/20/15 0028 09/21/15 1421 09/22/15 0532 09/23/15 0614 09/23/15 0900 09/24/15 0519  NA 133* 136 137 137  --  138  K 3.3* 3.3* 4.0 3.3*  --  4.7  CL 96* 99* 102 101  --  102  CO2 28 29 27 30   --  29  GLUCOSE 196* 286* 267* 124*  --  115*  BUN 15 13 13 8   --  10  CREATININE 0.83 0.94 0.73 0.73  --  0.75  CALCIUM 8.5* 8.3* 8.5* 8.5*  --  8.6*  MG   --  1.4* 1.7  --  1.5* 1.7   Liver Function Tests:  Recent Labs Lab 09/21/15 1421  AST 9*  ALT 8*  ALKPHOS 61  BILITOT 0.6  PROT 6.6  ALBUMIN 2.5*   No results for input(s): LIPASE, AMYLASE in the last 168 hours. No results for input(s): AMMONIA in the last 168 hours. CBC:  Recent Labs Lab 09/18/15 0516  09/20/15 0028 09/21/15 1421 09/22/15 0532 09/23/15 0614 09/24/15 0519  WBC 7.7  --   --  7.4 8.0 6.2  --   HGB 13.9  < > 14.2 11.9* 12.4* 11.4* 10.9*  HCT 40.6  < > 40.0 35.3* 37.5* 34.9* 34.1*  MCV 87.3  --   --  88.7 90.1 90.4  --   PLT 238  --   --  209 211 259  --   < > = values in this interval not displayed. Cardiac Enzymes: No results for input(s): CKTOTAL, CKMB, CKMBINDEX, TROPONINI in the last 168 hours. BNP: BNP (last 3 results) No results for input(s): BNP in the last 8760 hours.  ProBNP (last 3 results) No results for input(s): PROBNP in the last 8760 hours.  CBG:  Recent Labs Lab 09/23/15 1151 09/23/15 1752 09/23/15 2120 09/24/15 0749 09/24/15 1422  GLUCAP 155* 166* 218* 144* 191*       Signed:  Deone Omahoney MD.  [facility] 09/24/2015, 6:54 PM

## 2015-09-24 NOTE — Progress Notes (Signed)
Thomas Sosa to be D/C'd to home per MD order.  Discussed with the patient and all questions fully answered.  VSS, Skin clean, dry and intact without evidence of skin break down, no evidence of skin tears noted. IV catheter discontinued intact. Site without signs and symptoms of complications. Dressing and pressure applied.  An After Visit Summary was printed and given to the patient. Patient received prescriptions.  D/c education completed with patient/family including follow up instructions, medication list, d/c activities limitations if indicated, with other d/c instructions as indicated by MD - patient able to verbalize understanding, all questions fully answered.   Patient instructed to return to ED, call 911, or call MD for any changes in condition.   Patient escorted via [name], and D/C home via private auto.  [name] Price 09/24/2015 5:21 PM

## 2015-09-26 LAB — GLUCOSE, CAPILLARY: GLUCOSE-CAPILLARY: 166 mg/dL — AB (ref 65–99)

## 2015-10-05 ENCOUNTER — Ambulatory Visit (HOSPITAL_BASED_OUTPATIENT_CLINIC_OR_DEPARTMENT_OTHER): Payer: Medicare HMO

## 2015-10-11 ENCOUNTER — Encounter (HOSPITAL_COMMUNITY): Payer: Self-pay | Admitting: Emergency Medicine

## 2015-10-11 ENCOUNTER — Emergency Department (HOSPITAL_COMMUNITY): Payer: Medicare HMO

## 2015-10-11 ENCOUNTER — Observation Stay (HOSPITAL_COMMUNITY)
Admission: EM | Admit: 2015-10-11 | Discharge: 2015-10-14 | Disposition: A | Discharge status: Home / Self Care | Payer: Medicare HMO | Attending: Family Medicine | Admitting: Family Medicine

## 2015-10-11 DIAGNOSIS — R29898 Other symptoms and signs involving the musculoskeletal system: Secondary | ICD-10-CM | POA: Diagnosis present

## 2015-10-11 DIAGNOSIS — E1142 Type 2 diabetes mellitus with diabetic polyneuropathy: Secondary | ICD-10-CM | POA: Diagnosis not present

## 2015-10-11 DIAGNOSIS — I69398 Other sequelae of cerebral infarction: Secondary | ICD-10-CM

## 2015-10-11 DIAGNOSIS — R4701 Aphasia: Secondary | ICD-10-CM | POA: Diagnosis not present

## 2015-10-11 DIAGNOSIS — I69359 Hemiplegia and hemiparesis following cerebral infarction affecting unspecified side: Secondary | ICD-10-CM | POA: Diagnosis not present

## 2015-10-11 DIAGNOSIS — I714 Abdominal aortic aneurysm, without rupture, unspecified: Secondary | ICD-10-CM | POA: Diagnosis present

## 2015-10-11 DIAGNOSIS — I110 Hypertensive heart disease with heart failure: Secondary | ICD-10-CM | POA: Insufficient documentation

## 2015-10-11 DIAGNOSIS — R531 Weakness: Principal | ICD-10-CM | POA: Insufficient documentation

## 2015-10-11 DIAGNOSIS — M545 Low back pain, unspecified: Secondary | ICD-10-CM | POA: Diagnosis present

## 2015-10-11 DIAGNOSIS — Z87891 Personal history of nicotine dependence: Secondary | ICD-10-CM | POA: Insufficient documentation

## 2015-10-11 DIAGNOSIS — R8271 Bacteriuria: Secondary | ICD-10-CM | POA: Diagnosis present

## 2015-10-11 DIAGNOSIS — I159 Secondary hypertension, unspecified: Secondary | ICD-10-CM | POA: Diagnosis present

## 2015-10-11 DIAGNOSIS — E785 Hyperlipidemia, unspecified: Secondary | ICD-10-CM | POA: Insufficient documentation

## 2015-10-11 DIAGNOSIS — Z79899 Other long term (current) drug therapy: Secondary | ICD-10-CM | POA: Insufficient documentation

## 2015-10-11 DIAGNOSIS — E876 Hypokalemia: Secondary | ICD-10-CM | POA: Diagnosis not present

## 2015-10-11 DIAGNOSIS — E1151 Type 2 diabetes mellitus with diabetic peripheral angiopathy without gangrene: Secondary | ICD-10-CM | POA: Insufficient documentation

## 2015-10-11 DIAGNOSIS — Z7982 Long term (current) use of aspirin: Secondary | ICD-10-CM | POA: Diagnosis not present

## 2015-10-11 DIAGNOSIS — M25422 Effusion, left elbow: Secondary | ICD-10-CM | POA: Diagnosis not present

## 2015-10-11 DIAGNOSIS — G8929 Other chronic pain: Secondary | ICD-10-CM | POA: Diagnosis not present

## 2015-10-11 DIAGNOSIS — Z8546 Personal history of malignant neoplasm of prostate: Secondary | ICD-10-CM | POA: Insufficient documentation

## 2015-10-11 DIAGNOSIS — I639 Cerebral infarction, unspecified: Secondary | ICD-10-CM

## 2015-10-11 DIAGNOSIS — I2510 Atherosclerotic heart disease of native coronary artery without angina pectoris: Secondary | ICD-10-CM | POA: Diagnosis not present

## 2015-10-11 DIAGNOSIS — Z794 Long term (current) use of insulin: Secondary | ICD-10-CM | POA: Insufficient documentation

## 2015-10-11 DIAGNOSIS — I5042 Chronic combined systolic (congestive) and diastolic (congestive) heart failure: Secondary | ICD-10-CM | POA: Diagnosis not present

## 2015-10-11 DIAGNOSIS — R4781 Slurred speech: Secondary | ICD-10-CM | POA: Insufficient documentation

## 2015-10-11 DIAGNOSIS — Z951 Presence of aortocoronary bypass graft: Secondary | ICD-10-CM | POA: Insufficient documentation

## 2015-10-11 DIAGNOSIS — I5022 Chronic systolic (congestive) heart failure: Secondary | ICD-10-CM | POA: Diagnosis present

## 2015-10-11 LAB — APTT: APTT: 35 s (ref 24–37)

## 2015-10-11 LAB — URINALYSIS, ROUTINE W REFLEX MICROSCOPIC
Bilirubin Urine: NEGATIVE
KETONES UR: 40 mg/dL — AB
LEUKOCYTES UA: NEGATIVE
Nitrite: NEGATIVE
PH: 6 (ref 5.0–8.0)
Protein, ur: 100 mg/dL — AB
Specific Gravity, Urine: 1.022 (ref 1.005–1.030)

## 2015-10-11 LAB — COMPREHENSIVE METABOLIC PANEL
ALBUMIN: 2.9 g/dL — AB (ref 3.5–5.0)
ALT: 9 U/L — ABNORMAL LOW (ref 17–63)
AST: 13 U/L — AB (ref 15–41)
Alkaline Phosphatase: 72 U/L (ref 38–126)
Anion gap: 10 (ref 5–15)
BILIRUBIN TOTAL: 1.5 mg/dL — AB (ref 0.3–1.2)
BUN: 10 mg/dL (ref 6–20)
CHLORIDE: 101 mmol/L (ref 101–111)
CO2: 26 mmol/L (ref 22–32)
Calcium: 8.4 mg/dL — ABNORMAL LOW (ref 8.9–10.3)
Creatinine, Ser: 0.9 mg/dL (ref 0.61–1.24)
GFR calc Af Amer: >60 mL/min (ref >60)
GFR calc non Af Amer: >60 mL/min (ref >60)
GLUCOSE: 233 mg/dL — AB (ref 65–99)
POTASSIUM: 3.3 mmol/L — AB (ref 3.5–5.1)
Sodium: 137 mmol/L (ref 135–145)
Total Protein: 7.8 g/dL (ref 6.5–8.1)

## 2015-10-11 LAB — CBC
HCT: 36.3 % — ABNORMAL LOW (ref 39.0–52.0)
HEMOGLOBIN: 12.4 g/dL — AB (ref 13.0–17.0)
MCH: 29.7 pg (ref 26.0–34.0)
MCHC: 34.2 g/dL (ref 30.0–36.0)
MCV: 86.8 fL (ref 78.0–100.0)
Platelets: 259 10*3/uL (ref 150–400)
RBC: 4.18 MIL/uL — AB (ref 4.22–5.81)
RDW: 12.7 % (ref 11.5–15.5)
WBC: 10.2 10*3/uL (ref 4.0–10.5)

## 2015-10-11 LAB — GLUCOSE, CAPILLARY: GLUCOSE-CAPILLARY: 223 mg/dL — AB (ref 65–99)

## 2015-10-11 LAB — URINE MICROSCOPIC-ADD ON

## 2015-10-11 LAB — I-STAT CHEM 8, ED
BUN: 11 mg/dL (ref 6–20)
Calcium, Ion: 1.05 mmol/L — ABNORMAL LOW (ref 1.12–1.23)
Chloride: 98 mmol/L — ABNORMAL LOW (ref 101–111)
Creatinine, Ser: 0.7 mg/dL (ref 0.61–1.24)
Glucose, Bld: 233 mg/dL — ABNORMAL HIGH (ref 65–99)
HEMATOCRIT: 36 % — AB (ref 39.0–52.0)
HEMOGLOBIN: 12.2 g/dL — AB (ref 13.0–17.0)
POTASSIUM: 3.3 mmol/L — AB (ref 3.5–5.1)
SODIUM: 140 mmol/L (ref 135–145)
TCO2: 26 mmol/L (ref 0–100)

## 2015-10-11 LAB — DIFFERENTIAL
BASOS ABS: 0 10*3/uL (ref 0.0–0.1)
Basophils Relative: 0 %
EOS ABS: 0.1 10*3/uL (ref 0.0–0.7)
Eosinophils Relative: 1 %
LYMPHS ABS: 1.1 10*3/uL (ref 0.7–4.0)
Lymphocytes Relative: 11 %
Monocytes Absolute: 1 10*3/uL (ref 0.1–1.0)
Monocytes Relative: 10 %
Neutro Abs: 8 10*3/uL — ABNORMAL HIGH (ref 1.7–7.7)
Neutrophils Relative %: 78 %

## 2015-10-11 LAB — SEDIMENTATION RATE: Sed Rate: 112 mm/hr — ABNORMAL HIGH (ref 0–16)

## 2015-10-11 LAB — I-STAT TROPONIN, ED: Troponin i, poc: 0.01 ng/mL (ref 0.00–0.08)

## 2015-10-11 LAB — PROTIME-INR
INR: 1.24 (ref 0.00–1.49)
Prothrombin Time: 15.7 seconds — ABNORMAL HIGH (ref 11.6–15.2)

## 2015-10-11 LAB — CBG MONITORING, ED: Glucose-Capillary: 237 mg/dL — ABNORMAL HIGH (ref 65–99)

## 2015-10-11 LAB — MAGNESIUM: Magnesium: 1.1 mg/dL — ABNORMAL LOW (ref 1.7–2.4)

## 2015-10-11 LAB — URIC ACID: URIC ACID, SERUM: 4.9 mg/dL (ref 4.4–7.6)

## 2015-10-11 LAB — C-REACTIVE PROTEIN: CRP: 19.9 mg/dL — ABNORMAL HIGH (ref <1.0)

## 2015-10-11 MED ORDER — CARVEDILOL 12.5 MG PO TABS
12.5000 mg | ORAL_TABLET | Freq: Two times a day (BID) | ORAL | Status: DC
  Administered 2015-10-12 – 2015-10-14 (×5): 12.5 mg via ORAL
  Filled 2015-10-11 (×5): qty 1

## 2015-10-11 MED ORDER — ONDANSETRON HCL 4 MG/2ML IJ SOLN
4.0000 mg | Freq: Four times a day (QID) | INTRAMUSCULAR | Status: DC | PRN
Start: 2015-10-11 — End: 2015-10-14

## 2015-10-11 MED ORDER — BISACODYL 5 MG PO TBEC: 5.0000 mg | DELAYED_RELEASE_TABLET | Freq: Every day | ORAL | Status: DC | PRN

## 2015-10-11 MED ORDER — OXYCODONE-ACETAMINOPHEN 5-325 MG PO TABS
1.0000 | ORAL_TABLET | Freq: Four times a day (QID) | ORAL | Status: DC | PRN
  Administered 2015-10-11: 2 via ORAL
  Filled 2015-10-11: qty 2

## 2015-10-11 MED ORDER — MORPHINE SULFATE (PF) 2 MG/ML IV SOLN
2.0000 mg | Freq: Once | INTRAVENOUS | Status: AC
  Administered 2015-10-11: 2 mg via INTRAVENOUS
  Filled 2015-10-11: qty 1

## 2015-10-11 MED ORDER — METHYLPREDNISOLONE SODIUM SUCC 125 MG IJ SOLR
125.0000 mg | Freq: Once | INTRAMUSCULAR | Status: AC
  Administered 2015-10-11: 125 mg via INTRAVENOUS
  Filled 2015-10-11: qty 2

## 2015-10-11 MED ORDER — INSULIN ASPART 100 UNIT/ML ~~LOC~~ SOLN
4.0000 [IU] | Freq: Three times a day (TID) | SUBCUTANEOUS | Status: DC
  Administered 2015-10-12 – 2015-10-14 (×8): 4 [IU] via SUBCUTANEOUS

## 2015-10-11 MED ORDER — POTASSIUM CHLORIDE 10 MEQ/100ML IV SOLN
10.0000 meq | INTRAVENOUS | Status: AC
  Administered 2015-10-11 (×2): 10 meq via INTRAVENOUS
  Filled 2015-10-11 (×2): qty 100

## 2015-10-11 MED ORDER — GABAPENTIN 300 MG PO CAPS
900.0000 mg | ORAL_CAPSULE | Freq: Three times a day (TID) | ORAL | Status: DC
  Administered 2015-10-11 – 2015-10-14 (×8): 900 mg via ORAL
  Filled 2015-10-11 (×8): qty 3

## 2015-10-11 MED ORDER — ACETAMINOPHEN 650 MG RE SUPP: 650.0000 mg | Freq: Four times a day (QID) | RECTAL | Status: DC | PRN

## 2015-10-11 MED ORDER — ASPIRIN EC 325 MG PO TBEC
325.0000 mg | DELAYED_RELEASE_TABLET | Freq: Every day | ORAL | Status: DC
  Administered 2015-10-11 – 2015-10-14 (×4): 325 mg via ORAL
  Filled 2015-10-11 (×4): qty 1

## 2015-10-11 MED ORDER — ISOSORBIDE DINITRATE 5 MG PO TABS
5.0000 mg | ORAL_TABLET | Freq: Two times a day (BID) | ORAL | Status: DC
  Administered 2015-10-11 – 2015-10-14 (×6): 5 mg via ORAL
  Filled 2015-10-11 (×6): qty 1

## 2015-10-11 MED ORDER — ONDANSETRON HCL 4 MG PO TABS: 4.0000 mg | ORAL_TABLET | Freq: Four times a day (QID) | ORAL | Status: DC | PRN

## 2015-10-11 MED ORDER — METHOCARBAMOL 500 MG PO TABS
500.0000 mg | ORAL_TABLET | Freq: Two times a day (BID) | ORAL | Status: DC
  Administered 2015-10-11 – 2015-10-14 (×6): 500 mg via ORAL
  Filled 2015-10-11 (×6): qty 1

## 2015-10-11 MED ORDER — PANTOPRAZOLE SODIUM 40 MG PO TBEC
40.0000 mg | DELAYED_RELEASE_TABLET | Freq: Two times a day (BID) | ORAL | Status: DC
  Administered 2015-10-11 – 2015-10-14 (×6): 40 mg via ORAL
  Filled 2015-10-11 (×6): qty 1

## 2015-10-11 MED ORDER — MAGNESIUM HYDROXIDE 400 MG/5ML PO SUSP: 30.0000 mL | Freq: Every day | ORAL | Status: DC | PRN

## 2015-10-11 MED ORDER — HYDRALAZINE HCL 25 MG PO TABS
25.0000 mg | ORAL_TABLET | Freq: Two times a day (BID) | ORAL | Status: DC
  Administered 2015-10-11 – 2015-10-14 (×6): 25 mg via ORAL
  Filled 2015-10-11 (×6): qty 1

## 2015-10-11 MED ORDER — ATORVASTATIN CALCIUM 80 MG PO TABS
80.0000 mg | ORAL_TABLET | Freq: Every day | ORAL | Status: DC
  Administered 2015-10-11 – 2015-10-13 (×3): 80 mg via ORAL
  Filled 2015-10-11 (×3): qty 1

## 2015-10-11 MED ORDER — INSULIN ASPART 100 UNIT/ML ~~LOC~~ SOLN
0.0000 [IU] | Freq: Three times a day (TID) | SUBCUTANEOUS | Status: DC
  Administered 2015-10-12: 11 [IU] via SUBCUTANEOUS
  Administered 2015-10-12 (×2): 8 [IU] via SUBCUTANEOUS
  Administered 2015-10-13: 3 [IU] via SUBCUTANEOUS
  Administered 2015-10-13 (×2): 5 [IU] via SUBCUTANEOUS
  Administered 2015-10-14: 3 [IU] via SUBCUTANEOUS
  Administered 2015-10-14: 2 [IU] via SUBCUTANEOUS

## 2015-10-11 MED ORDER — LOSARTAN POTASSIUM 25 MG PO TABS
25.0000 mg | ORAL_TABLET | Freq: Every day | ORAL | Status: DC
  Administered 2015-10-12 – 2015-10-14 (×3): 25 mg via ORAL
  Filled 2015-10-11 (×4): qty 1

## 2015-10-11 MED ORDER — MAGNESIUM OXIDE 400 (241.3 MG) MG PO TABS
400.0000 mg | ORAL_TABLET | Freq: Two times a day (BID) | ORAL | Status: DC
  Administered 2015-10-11 – 2015-10-12 (×3): 400 mg via ORAL
  Filled 2015-10-11 (×3): qty 1

## 2015-10-11 MED ORDER — DEXTROSE 5 % IV SOLN
1.0000 g | Freq: Once | INTRAVENOUS | Status: AC
  Administered 2015-10-11: 1 g via INTRAVENOUS
  Filled 2015-10-11: qty 10

## 2015-10-11 MED ORDER — ACETAMINOPHEN 325 MG PO TABS: 650.0000 mg | ORAL_TABLET | Freq: Four times a day (QID) | ORAL | Status: DC | PRN

## 2015-10-11 MED ORDER — AMITRIPTYLINE HCL 25 MG PO TABS
50.0000 mg | ORAL_TABLET | Freq: Every day | ORAL | Status: DC
  Administered 2015-10-11 – 2015-10-13 (×3): 50 mg via ORAL
  Filled 2015-10-11 (×3): qty 2

## 2015-10-11 MED ORDER — INSULIN GLARGINE 100 UNIT/ML ~~LOC~~ SOLN
30.0000 [IU] | Freq: Every day | SUBCUTANEOUS | Status: DC
Start: 2015-10-11 — End: 2015-10-14
  Administered 2015-10-11 – 2015-10-13 (×3): 30 [IU] via SUBCUTANEOUS
  Filled 2015-10-11 (×4): qty 0.3

## 2015-10-11 MED ORDER — POLYETHYLENE GLYCOL 3350 17 G PO PACK
17.0000 g | PACK | Freq: Two times a day (BID) | ORAL | Status: DC
  Administered 2015-10-13: 17 g via ORAL
  Filled 2015-10-11 (×3): qty 1

## 2015-10-11 MED ORDER — HEPARIN SODIUM (PORCINE) 5000 UNIT/ML IJ SOLN
5000.0000 [IU] | Freq: Three times a day (TID) | INTRAMUSCULAR | Status: DC
  Administered 2015-10-11 – 2015-10-14 (×8): 5000 [IU] via SUBCUTANEOUS
  Filled 2015-10-11 (×8): qty 1

## 2015-10-11 NOTE — ED Notes (Signed)
Pt repositioned on his right side for comfort

## 2015-10-11 NOTE — ED Notes (Signed)
Pt sts weakness in legs with lower back pain and left arm weakness with pain in elbow and right shoulder; pt sts slurred speech starting last night; pt sts not feeling well x 3 days

## 2015-10-11 NOTE — ED Notes (Signed)
Gave pt water per Loma Sousa- RN.

## 2015-10-11 NOTE — Consult Note (Signed)
Neurology Consult Note  Reason for Consultation: BLE weakness  Requesting provider: Mitzi Hansen, MD  CC: Increased back pain, leg weakness  HPI: This is a 60 year old right-handed man who is admitted to hospital for evaluation of bilateral lower extremity weakness. History is obtained directly from the patient who is a fair historian. I've also reviewed his medical records at length.  The patient reports that he has had long-standing low back pain for many years. However, over the past 2-3 days, his pain has increased in intensity. He is not aware of any inciting insult or injury that may explain the increase in his pain. However, he has noted that when he tries to stand up from a seated position, he has to use his hands to press down on his thighs while he stands. He then marches his hands up to his waist until he is in an upright position. He states that he has been unable to take a step without holding onto something. After he takes 1 or 2 steps, however, he is then able to ambulate as per his norm. When asked why he has difficulty standing, he reports that this is because he has severe back pain. He also endorses weakness in his legs, but this is somewhat vague and he is not really able to tell me where he feels weak specifically. He does not feel as if he has any lateralized weakness and says that the weakness involves both legs symmetrically. Regarding his back pain, this is isolated over his lumbar spine and does not radiate into his legs or anywhere else. He reports that he has long-standing peripheral neuropathy in both legs to the level of the knees. He is not reporting any new changes with his sensation in his legs. He also reports that he has not appreciated any changes in bowel or bladder function. He typically has a bowel movement once every couple of weeks and this has not changed recently. He denies any urinary retention or incontinence.  Of note, he was recently admitted to [facility]  from 09/16/15 through 09/24/15 after developing acute left-sided weakness. He had CT scan of the head which did not reveal any obvious acute abnormality. MRI could not be performed because he has a defibrillator in place. 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 In addition, advanced atherosclerotic calcification was seen in the cavernous carotids bilaterally with severe stenosis, the degree of which was difficult to estimate because of overlying calcification. Transthoracic echocardiogram was performed and was described as poor quality. He was noted to have mild LVH with ejection fraction 45-50% and inferior hypokinesis. He was followed by the stroke team. Per their notes, he had been taking aspirin 81 mg daily prior to this event. He was previously on warfarin but stopped this 1 month before his stroke because he was unable to afford it. After his stroke he was changed aspirin 325 mg daily, though he states that he has not been taking this since his discharge from the hospital.   PMH:  Past Medical History  Diagnosis Date  . Hypertension   . Diabetes mellitus without complication ([place])   . Diabetic neuropathy ([place]) Dx 2011  . Cancer of prostate ([place]) Dx 2005  . Hyperlipidemia   . Coronary artery disease   . Tobacco abuse   . Peripheral arterial disease ([place])   . Stroke ([place])   . Hemiparesis and alteration  of sensations as late effects of stroke ([place]) 02/28/2015    PSH:  Past Surgical History  Procedure Laterality Date  . Replacement total knee  Lt 2013/ Rt 2005  . Ep implantable device N/A 10/13/2014    Procedure: ICD Implant;  Surgeon: Deboraha Sprang, MD;  Location: [place] [facility];  Service: Cardiovascular;  Laterality: N/A;  . Tee without cardioversion N/A 10/11/2014    Procedure: TRANSESOPHAGEAL ECHOCARDIOGRAM (TEE);  Surgeon: Dorothy Spark, MD;  Location: [facility]  ENDOSCOPY;  Service: Cardiovascular;  Laterality: N/A;  . Esophagogastroduodenoscopy (egd) with propofol Left 09/19/2015    Procedure: ESOPHAGOGASTRODUODENOSCOPY (EGD) WITH PROPOFOL;  Surgeon: Wilford Corner, MD;  Location: [facility] ENDOSCOPY;  Service: Endoscopy;  Laterality: Left;    Family history: Family History  Problem Relation Age of Onset  . Heart Problems Mother   . Heart Problems Father   . Heart attack Brother     Social history:  Social History   Social History  . Marital Status: Married    Spouse Name: N/A  . Number of Children: 10  . Years of Education: 12   Occupational History  . disability    Social History Main Topics  . Smoking status: Former Smoker    Quit date: 03/26/2014  . Smokeless tobacco: Never Used  . Alcohol Use: No  . Drug Use: No  . Sexual Activity: Not on file   Other Topics Concern  . Not on file   Social History Narrative   Patient drinks about 10 cups of caffeine daily.   Patient is right handed.     Current outpatient meds: Current Meds  Medication Sig  . amitriptyline (ELAVIL) 50 MG tablet Take 1 tablet (50 mg total) by mouth at bedtime.  [name] aspirin EC 325 MG EC tablet Take 1 tablet (325 mg total) by mouth daily.  [name] atorvastatin (LIPITOR) 80 MG tablet Take 1 tablet (80 mg total) by mouth daily.  . carvedilol (COREG) 12.5 MG tablet Take 1 tablet (12.5 mg total) by mouth 2 (two) times daily.  [name] gabapentin (NEURONTIN) 600 MG tablet Take 1.5 tablets (900 mg total) by mouth 3 (three) times daily. Must have office visit for refills  . glipiZIDE (GLUCOTROL XL) 5 MG 24 hr tablet Take 1 tablet (5 mg total) by mouth daily with breakfast.  . hydrALAZINE (APRESOLINE) 25 MG tablet Take 1 tablet (25 mg total) by mouth 2 (two) times daily.  . insulin NPH-regular Human (NOVOLIN 70/30) (70-30) 100 UNIT/ML injection Inject 20 Units into the skin 2 (two) times daily with a meal.  . insulin regular (NOVOLIN R) 100 units/mL injection Inject 0.05 mLs (5  Units total) into the skin 3 (three) times daily before meals.  . isosorbide dinitrate (ISORDIL) 5 MG tablet Take 1 tablet (5 mg total) by mouth 2 (two) times daily.  [name] losartan (COZAAR) 25 MG tablet Take 1 tablet (25 mg total) by mouth daily.  . magnesium oxide (MAG-OX) 400 (241.3 Mg) MG tablet Take 1 tablet (400 mg total) by mouth 2 (two) times daily.  [name] omeprazole (PRILOSEC) 20 MG capsule Take 20 mg by mouth daily.  [name] oxyCODONE-acetaminophen (PERCOCET/ROXICET) 5-325 MG tablet Take 1 tablet by mouth every 8 (eight) hours as needed for moderate pain or severe pain.  . polyethylene glycol (MIRALAX / GLYCOLAX) packet Take 17 g by mouth 2 (two) times daily.    Current inpatient meds:  Current Facility-Administered Medications  Medication Dose Route Frequency Provider Last Rate Last Dose  . acetaminophen (  TYLENOL) tablet 650 mg  650 mg Oral Q6H PRN Vianne Bulls, MD       Or  . acetaminophen (TYLENOL) suppository 650 mg  650 mg Rectal Q6H PRN Vianne Bulls, MD      . amitriptyline (ELAVIL) tablet 50 mg  50 mg Oral QHS Vianne Bulls, MD      . aspirin EC tablet 325 mg  325 mg Oral Daily Vianne Bulls, MD      . atorvastatin (LIPITOR) tablet 80 mg  80 mg Oral q1800 Ilene Qua Opyd, MD      . bisacodyl (DULCOLAX) EC tablet 5 mg  5 mg Oral Daily PRN Vianne Bulls, MD      . carvedilol (COREG) tablet 12.5 mg  12.5 mg Oral BID WC Ilene Qua Opyd, MD      . gabapentin (NEURONTIN) capsule 900 mg  900 mg Oral TID Vianne Bulls, MD      . heparin injection 5,000 Units  5,000 Units Subcutaneous Q8H Ilene Qua Opyd, MD      . hydrALAZINE (APRESOLINE) tablet 25 mg  25 mg Oral BID Vianne Bulls, MD      . Derrill Memo ON 10/12/2015] insulin aspart (novoLOG) injection 0-15 Units  0-15 Units Subcutaneous TID WC Vianne Bulls, MD      . Derrill Memo ON 10/12/2015] insulin aspart (novoLOG) injection 4 Units  4 Units Subcutaneous TID WC Maurizio Geno S Opyd, MD      . insulin glargine (LANTUS) injection 30 Units  30 Units  Subcutaneous QHS Ilene Qua Opyd, MD      . isosorbide dinitrate (ISORDIL) tablet 5 mg  5 mg Oral BID Vianne Bulls, MD      . losartan (COZAAR) tablet 25 mg  25 mg Oral Daily Ilene Qua Opyd, MD      . magnesium hydroxide (MILK OF MAGNESIA) suspension 30 mL  30 mL Oral Daily PRN Ilene Qua Opyd, MD      . magnesium oxide (MAG-OX) tablet 400 mg  400 mg Oral BID Vianne Bulls, MD      . methocarbamol (ROBAXIN) tablet 500 mg  500 mg Oral BID Vianne Bulls, MD      . ondansetron (ZOFRAN) tablet 4 mg  4 mg Oral Q6H PRN Vianne Bulls, MD       Or  . ondansetron (ZOFRAN) injection 4 mg  4 mg Intravenous Q6H PRN Vianne Bulls, MD      . oxyCODONE-acetaminophen (PERCOCET/ROXICET) 5-325 MG per tablet 1-2 tablet  1-2 tablet Oral Q6H PRN Vianne Bulls, MD      . pantoprazole (PROTONIX) EC tablet 40 mg  40 mg Oral BID AC Naarah Borgerding S Opyd, MD      . polyethylene glycol (MIRALAX / GLYCOLAX) packet 17 g  17 g Oral BID Ilene Qua Opyd, MD      . potassium chloride 10 mEq in 100 mL IVPB  10 mEq Intravenous Q1 Hr x 2 Vianne Bulls, MD        Allergies: Allergies  Allergen Reactions  . Lisinopril Cough    ROS: As per HPI. A full 14-point review of systems was performed and is otherwise notable for severe L elbow pain. He also has some muscle pain in the R neck that increases when he turns his head to the right. Remainder of ROS is unremarkable.   PE:  BP 169/94 mmHg  Pulse 89  Temp(Src) 100 F (37.8 C) (Oral)  Resp 18  Ht 6' (1.829 m)  Wt 98 kg (216 lb 0.8 oz)  BMI 29.30 kg/m2  SpO2 98%  General: WDWN, no acute distress. AAO x4. Speech clear, no dysarthria. No aphasia. Follows commands briskly. Affect is bright with congruent mood. Comportment is normal.  HEENT: Normocephalic. Neck supple without LAD. MMM, OP clear. Dentition good. Sclerae anicteric. No conjunctival injection.  CV: Regular, no murmur. Carotid pulses full and symmetric, no bruits. Distal pulses 2+ and symmetric.  Lungs: CTAB.   Abdomen: Soft, non-distended, non-tender. Bowel sounds present x4.  Extremities: No C/C/E. Neuro:  CN: Pupils are equal and round. They are symmetrically reactive from 3-->2 mm. EOMI with some breakup of smooth pursuits but no nystagmus. No reported diplopia. Facial sensation is intact to light touch. Face is symmetric at rest with normal strength and mobility. Hearing is intact to conversational voice. Palate elevates symmetrically and uvula is midline. Voice is normal in tone, pitch and quality. Bilateral SCM and trapezii are 5/5. Tongue is midline with normal bulk and mobility.  Motor: Normal bulk, tone, and strength. Testing of the proximal LUE was limited by severe L elbow pain. He has some giveway with hip flexion and knee extension due to increased back pain. No tremor or other abnormal movements. No drift.  Sensation: Intact to light touch. He has patchy and somewhat inconsistent decrease in pinprick in BLE, greater on the right than the left. Exam is most suggestive of R L4 and L5 and L L5 radiculopathy. In addition he has stocking distribution loss of all modalities to the knees bilaterally. vibration, and joint position.  DTRs: 2+ RUE and L brachioradialis. L biceps and triceps deferred 2/2 L elbow pain with even light touch. DTRs absent both LE's. Toes downgoing on R, mute on L. Coordination: Finger-to-nose without dysmetria on R. L FTN and bilateral heel-to-shin were deferred due to L elbow pain and back pain respectively. Finger taps are normal in amplitude and speed, no decrement.   Labs:  Lab Results  Component Value Date   WBC 10.2 10/11/2015   HGB 12.2* 10/11/2015   HCT 36.0* 10/11/2015   PLT 259 10/11/2015   GLUCOSE 233* 10/11/2015   CHOL 127 09/22/2015   TRIG 90 09/22/2015   HDL 36* 09/22/2015   LDLCALC 73 09/22/2015   ALT 9* 10/11/2015   AST 13* 10/11/2015   NA 140 10/11/2015   K 3.3* 10/11/2015   CL 98* 10/11/2015   CREATININE 0.70 10/11/2015   BUN 11 10/11/2015    CO2 26 10/11/2015   INR 1.24 10/11/2015   HGBA1C 11.8* 09/21/2015   MICROALBUR 37.0 02/21/2015   Lipid panel from 09/22/15: Cholesterol 127, triglycerides 90, HDL 36, LDL 73  Hemoglobin A1c from 09/21/15: 11.8  Imaging:  I have personally and independently reviewed the CT scan of the head without contrast from 10/11/15. There do not appear to be any acute abnormalities. Scattered areas of well-defined hypo-density are seen within the right frontal and parietal lobes consistent with previous infarctions. In addition, he has patchy areas of hypodensity in the bihemispheric white matter suggesting chronic small vessel ischemic change.   Assessment and Plan:  1. Bilateral lower extremity weakness: This seems to be more of a subjective complaint as on my examination I am not identifying any overt weakness. I suspect this is largely driven by his back pain which does at times limit his ability to provide full effort on confrontational strength testing. Indeed, after accounting for giveway due to pain, he  appears to have normal strength in his legs. There is nothing to suggest an acute CNS event at this time. MRI of the lumbar spine has been scheduled for tomorrow. I do not think additional imaging such as MRI of the brain will add much so I will defer at this time. Physical therapy consultation should be considered.  2. Acute on chronic low back pain: By report this is localized to his lumbar spine without radiation that would suggest acute radiculopathy. MRI of the lumbar spine has been planned for tomorrow. Pain management as per primary. Physical therapy may be beneficial.  3. Peripheral neuropathy: He has a symmetric stocking distribution sensory polyneuropathy involving all sensory modalities, though this is predominantly large fiber. This is most consistent with a diabetic neuropathy, particularly in light of his poorly controlled diabetes with recent hemoglobin A1c of 11.8 a few weeks ago. Ensure  tight glycemic control. His neuropathy likely contributes to his gait instability and increases his risk for falls.  4. 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 His examination suggests sensory loss in the L4 and L5 dermatomes on the right as well as the L5 dermatome on the left. There is no myotomal weakness in these distributions. These findings are compatible with the degree of degenerative changes previously seen on CT scan of the lumbar spine. There are no acute issues relating to his radiculopathy at this time as pain is isolated to his spine without any radiation.  5. Left elbow pain: This is acute and seems to be localized to the joint. Evaluation and management as per primary as this is nonneurologic in nature.  This was discussed with the patient. No family was present at the bedside. He was given the opportunity to ask any questions and these were addressed to his satisfaction. Thank you very much for the opportunity to participate in this patient's care. Please feel free to call with any questions or concerns.

## 2015-10-11 NOTE — ED Notes (Signed)
Bladder scan 100 ml

## 2015-10-11 NOTE — ED Provider Notes (Signed)
CSN: [id number]     Arrival date & time 10/11/15  1049 History   First MD Initiated Contact with Patient 10/11/15 1106     Chief Complaint  Patient presents with  . Weakness  . Aphasia     (Consider location/radiation/quality/duration/timing/severity/associated sxs/prior Treatment) HPI   60 y.o. Male h.o. Dm, cva, discharged after gi bleed 09/24/15 presents today with complaints of low back pain began 4 days ago, no trauma, states unable to walk since then.  Also complaining of left elbow pain- denies injury.  He states the pain is in the low back and severe.  He has associated lower extremity weakness bilaterally and is unable to walk due to this.  He states sensation is decreased but states this is chronic from his neuropathy.  He denies loss of bowel or bladder control.  Patient reports aphasia for several hours yesterday morning which has completely resolved.     Past Medical History  Diagnosis Date  . Hypertension   . Diabetes mellitus without complication ([place])   . Diabetic neuropathy ([place]) Dx 2011  . Cancer of prostate ([place]) Dx 2005  . Hyperlipidemia   . Coronary artery disease   . Tobacco abuse   . Peripheral arterial disease ([place])   . Stroke ([place])   . Hemiparesis and alteration of sensations as late effects of stroke ([place]) 02/28/2015   Past Surgical History  Procedure Laterality Date  . Replacement total knee  Lt 2013/ Rt 2005  . Ep implantable device N/A 10/13/2014    Procedure: ICD Implant;  Surgeon: Deboraha Sprang, MD;  Location: [place] [facility];  Service: Cardiovascular;  Laterality: N/A;  . Tee without cardioversion N/A 10/11/2014    Procedure: TRANSESOPHAGEAL ECHOCARDIOGRAM (TEE);  Surgeon: Dorothy Spark, MD;  [facility] ENDOSCOPY;  Service: Cardiovascular;  Laterality: N/A;  . Esophagogastroduodenoscopy (egd) with propofol Left 09/19/2015    Procedure: ESOPHAGOGASTRODUODENOSCOPY (EGD) WITH PROPOFOL;  Surgeon: Wilford Corner, MD;  Location: [facility] ENDOSCOPY;   Service: Endoscopy;  Laterality: Left;   Family History  Problem Relation Age of Onset  . Heart Problems Mother   . Heart Problems Father   . Heart attack Brother    Social History  Substance Use Topics  . Smoking status: Former Smoker    Quit date: 03/26/2014  . Smokeless tobacco: Never Used  . Alcohol Use: No    Review of Systems  All other systems reviewed and are negative.     Allergies  Lisinopril  Home Medications   Prior to Admission medications   Medication Sig Start Date End Date Taking? Authorizing Provider  amitriptyline (ELAVIL) 50 MG tablet Take 1 tablet (50 mg total) by mouth at bedtime. 09/24/15   Eugenie Filler, MD  aspirin EC 325 MG EC tablet Take 1 tablet (325 mg total) by mouth daily. 09/24/15   Eugenie Filler, MD  atorvastatin (LIPITOR) 80 MG tablet Take 1 tablet (80 mg total) by mouth daily. 09/24/15   Eugenie Filler, MD  carvedilol (COREG) 12.5 MG tablet Take 1 tablet (12.5 mg total) by mouth 2 (two) times daily. 09/24/15   Eugenie Filler, MD  gabapentin (NEURONTIN) 600 MG tablet Take 1.5 tablets (900 mg total) by mouth 3 (three) times daily. Must have office visit for refills 09/24/15   Eugenie Filler, MD  glipiZIDE (GLUCOTROL XL) 5 MG 24 hr tablet Take 1 tablet (5 mg total) by mouth daily with breakfast. 09/24/15   Eugenie Filler, MD  hydrALAZINE (APRESOLINE) 25  MG tablet Take 1 tablet (25 mg total) by mouth 2 (two) times daily. 09/24/15   Eugenie Filler, MD  insulin NPH-regular Human (NOVOLIN 70/30) (70-30) 100 UNIT/ML injection Inject 20 Units into the skin 2 (two) times daily with a meal. 09/24/15   Eugenie Filler, MD  insulin regular (NOVOLIN R) 100 units/mL injection Inject 0.05 mLs (5 Units total) into the skin 3 (three) times daily before meals. 09/24/15   Eugenie Filler, MD  isosorbide dinitrate (ISORDIL) 5 MG tablet Take 1 tablet (5 mg total) by mouth 2 (two) times daily. 09/24/15   Eugenie Filler, MD  losartan (COZAAR) 25 MG tablet  Take 1 tablet (25 mg total) by mouth daily. 09/24/15   Eugenie Filler, MD  magnesium oxide (MAG-OX) 400 (241.3 Mg) MG tablet Take 1 tablet (400 mg total) by mouth 2 (two) times daily. 09/24/15   Eugenie Filler, MD  methocarbamol (ROBAXIN) 500 MG tablet Take 1 tablet (500 mg total) by mouth 2 (two) times daily. 09/14/15   Tatyana Kirichenko, PA-C  ONE TOUCH ULTRA TEST test strip TEST four times a day 06/07/14   [name], NP  oxyCODONE-acetaminophen (PERCOCET/ROXICET) 5-325 MG tablet Take 1 tablet by mouth every 8 (eight) hours as needed for moderate pain or severe pain. 09/24/15   Eugenie Filler, MD  pantoprazole (PROTONIX) 40 MG tablet Take 1 tablet (40 mg total) by mouth 2 (two) times daily before a meal. Take 2 times daily x 2 months, then 1 time daily. 09/24/15   Eugenie Filler, MD  polyethylene glycol [facility] / Floria Raveling) packet Take 17 g by mouth 2 (two) times daily. 04/27/15   [name], NP   BP 143/96 mmHg  Pulse 95  Temp(Src) 99.5 F (37.5 C) (Oral)  Resp 18  SpO2 98% Physical Exam  Constitutional: He appears well-developed and well-nourished. No distress.  HENT:  Head: Normocephalic and atraumatic.  Right Ear: External ear normal.  Left Ear: External ear normal.  Nose: Nose normal.  Mouth/Throat: Oropharynx is clear and moist.  Eyes: Conjunctivae and EOM are normal. Pupils are equal, round, and reactive to light.  Neck: Normal range of motion. Neck supple. No thyromegaly present.  Cardiovascular: Normal rate and regular rhythm.   Pulmonary/Chest: Effort normal and breath sounds normal.  Abdominal: Soft. Bowel sounds are normal.  Genitourinary: Guaiac negative stool.  Prostate abnormal with firm nodules  Musculoskeletal: Normal range of motion.       Arms: Neurological: He is alert. No cranial nerve deficit. Coordination normal.  Unable to lift legs against gravity bilaterally, toes and ankle flexion extension strong and equal bialterally, patient able to flex and  extend knees, patient unable to flex hips bilaterally ?due to pain- patient complained of pain with attempts to flex hips. Sensation decreased ankle down, normal proximal to this with some decrease left , perirectal sensation intact, rectal tone intact.   Skin: Skin is warm and dry.  Nursing note and vitals reviewed.   ED Course  Procedures (including critical care time) Labs Review Labs Reviewed  CBC - Abnormal; Notable for the following:    RBC 4.18 (*)    Hemoglobin 12.4 (*)    HCT 36.3 (*)    All other components within normal limits  DIFFERENTIAL - Abnormal; Notable for the following:    Neutro Abs 8.0 (*)    All other components within normal limits  CBG MONITORING, ED - Abnormal; Notable for the following:  Glucose-Capillary 237 (*)    All other components within normal limits  I-STAT CHEM 8, ED - Abnormal; Notable for the following:    Potassium 3.3 (*)    Chloride 98 (*)    Glucose, Bld 233 (*)    Calcium, Ion 1.05 (*)    Hemoglobin 12.2 (*)    HCT 36.0 (*)    All other components within normal limits  PROTIME-INR  APTT  COMPREHENSIVE METABOLIC PANEL  I-STAT TROPOININ, ED    Imaging Review Ct Head Wo Contrast  10/11/2015  CLINICAL DATA:  Left all arm pain. EXAM: CT HEAD WITHOUT CONTRAST TECHNIQUE: Contiguous axial images were obtained from the base of the skull through the vertex without intravenous contrast. COMPARISON:  09/21/2015. FINDINGS: There is no evidence for acute hemorrhage, hydrocephalus, mass lesion, or abnormal extra-axial fluid collection. No definite CT evidence for acute infarction. Diffuse loss of parenchymal volume is consistent with atrophy. Patchy low attenuation in the deep hemispheric and periventricular white matter is nonspecific, but likely reflects chronic microvascular ischemic demyelination. Opacification noted in scattered ethmoid air cells. Frontal and sphenoid sinuses are clear. Visualized portions of the maxillary sinuses are clear. No  mastoid effusion. No evidence for middle air-fluid. No skull fracture. IMPRESSION: 1. No acute intracranial abnormality. 2. Atrophy with chronic small vessel white matter ischemic disease. Electronically Signed   By: Misty Stanley M.D.   On: 10/11/2015 11:39   I have personally reviewed and evaluated these images and lab results as part of my medical decision-making.   EKG Interpretation   Date/Time:  Tuesday October 11 2015 11:09:32 EDT Ventricular Rate:  94 PR Interval:  152 QRS Duration: 114 QT Interval:  372 QTC Calculation: 465 R Axis:   -2 Text Interpretation:  Normal sinus rhythm Possible Left atrial enlargement  Left ventricular hypertrophy with repolarization abnormality Abnormal ECG  No significant change since last tracing Confirmed by Seneca Gadbois MD, Andee Poles  [id number]) on 10/11/2015 11:42:49 AM      MDM   Final diagnoses:  Lower extremity weakness      Patient with acute onset low back pain and decreased movement of bilateral hips.  MRI pending  Patient being treated for pain with morphine and solumedrol.  Discussed with Dr. Lita Mains and he will follow up studies and disposition patient.  Pattricia Boss, MD 10/13/15 [id number]

## 2015-10-11 NOTE — H&P (Signed)
History and Physical    Waitman Gammage [id number] DOB: 03-05-1956 DOA: 10/11/2015  PCP: [name], NP   Patient coming from: Home   Chief Complaint: Low back pain, leg weakness, slurred speech, left elbow pain and swelling  HPI: Thomas Sosa is a 60 y.o. male with medical history significant for chronic systolic CHF, CAD status post remote CABG, insulin-dependent diabetes mellitus, hypertension, chronic low back pain, prostate cancer status post brachytherapy, and chronic systolic CHF who presents to the emergency department with 3-4 days of acute on chronic low back pain, bilateral lower extremity weakness, nontraumatic swelling and pain of the left elbow, and transient slurred speech which has resolved. Patient was admitted to this institution last month with GI bleed localized to erosive esophagitis, and also was managed for acute left leg weakness which was attributed to likely acute stroke. MRI was precluded by pacer, and CTA of the head and neck demonstrated significant stenoses bilaterally. Patient was discharged on Protonix twice daily for the esophagitis, and full dose aspirin daily for secondary stroke prevention. He had initially done well back at home until approximately 3-4 days ago when he noted the insidious onset of bilateral lower extremity weakness with associated acute exacerbation in his chronic low back pain. Pain is the same in character and location as his chronic symptoms, just with increased severity. Described as constant deep ache in the central low back without radiation, exacerbated by movement, and with no alleviating factors identified. Patient denies radiation of pain to either leg, reports chronic bilateral lower extremity paresthesia secondary to diabetic neuropathy, but reports that the weakness has left him unable to ambulate. Over the same interval, his left elbow has become swollen, warm, and tender. He denies any trauma to the elbow, denies prior experience  with similar symptoms, and denies any history of gout. Patient denies fevers or chills. He denies headache, change in vision or hearing, confusion, or loss of correlation.  ED Course: Upon arrival to the ED, patient is found to be afebrile, saturating well on room air, and with vital signs stable. EKG demonstrates normal sinus rhythm with LVH and secondary repolarization abnormality. Noncontrast head CT was negative for acute intracranial abnormality. CT of the lumbar spine demonstrates mild canal stenosis at L4-5, neural foraminal narrowing at L3-4 and L5-S1, moderate to severe at L5-S1 on the left. There is no acute lumbar fracture, malalignment, or metastatic disease identified in the lumbar spine. Radiographs of the left elbow are consistent with significant degenerative changes and effusion. Chemistry panels notable for potassium of 3.3, glucose 233, albumin of 2.9, and total bilirubin 1.5. CBC features a stable normocytic anemia with hemoglobin of 12.4. Troponin is 0.01 and INR is 1.24. Urinalysis features bacteria, greater than 1000 glucose, 100 protein, squamous epithelial cells, and 6-30 WBC. Patient was given 125 mg IV push of Solu-Medrol, empiric Rocephin for suspected UTI, and symptomatic care with IV morphine. Patient remained hemodynamically stable in the ED and will be admitted to the neuro telemetry unit for ongoing evaluation and management of aforementioned complaints.  Review of Systems:  All other systems reviewed and apart from HPI, are negative.  Past Medical History  Diagnosis Date  . Hypertension   . Diabetes mellitus without complication ([place])   . Diabetic neuropathy ([place]) Dx 2011  . Cancer of prostate ([place]) Dx 2005  . Hyperlipidemia   . Coronary artery disease   . Tobacco abuse   . Peripheral arterial disease ([place])   . Stroke ([place])   .  Hemiparesis and alteration of sensations as late effects of stroke ([place]) 02/28/2015    Past Surgical History  Procedure Laterality Date    . Replacement total knee  Lt 2013/ Rt 2005  . Ep implantable device N/A 10/13/2014    Procedure: ICD Implant;  Surgeon: Deboraha Sprang, MD;  Location: [facility];  Service: Cardiovascular;  Laterality: N/A;  . Tee without cardioversion N/A 10/11/2014    Procedure: TRANSESOPHAGEAL ECHOCARDIOGRAM (TEE);  Surgeon: Dorothy Spark, MD;  Location: [facility];  Service: Cardiovascular;  Laterality: N/A;  . Esophagogastroduodenoscopy (egd) with propofol Left 09/19/2015    Procedure: ESOPHAGOGASTRODUODENOSCOPY (EGD) WITH PROPOFOL;  Surgeon: Wilford Corner, MD;  Location: [facility] ENDOSCOPY;  Service: Endoscopy;  Laterality: Left;     reports that he quit smoking about 18 months ago. He has never used smokeless tobacco. He reports that he does not drink alcohol or use illicit drugs.  Allergies  Allergen Reactions  . Lisinopril Cough    Family History  Problem Relation Age of Onset  . Heart Problems Mother   . Heart Problems Father   . Heart attack Brother      Prior to Admission medications   Medication Sig Start Date End Date Taking? Authorizing Provider  amitriptyline (ELAVIL) 50 MG tablet Take 1 tablet (50 mg total) by mouth at bedtime. 09/24/15  Yes Eugenie Filler, MD  aspirin EC 325 MG EC tablet Take 1 tablet (325 mg total) by mouth daily. 09/24/15  Yes Eugenie Filler, MD  atorvastatin (LIPITOR) 80 MG tablet Take 1 tablet (80 mg total) by mouth daily. 09/24/15  Yes Eugenie Filler, MD  carvedilol (COREG) 12.5 MG tablet Take 1 tablet (12.5 mg total) by mouth 2 (two) times daily. 09/24/15  Yes Eugenie Filler, MD  gabapentin (NEURONTIN) 600 MG tablet Take 1.5 tablets (900 mg total) by mouth 3 (three) times daily. Must have office visit for refills 09/24/15  Yes Eugenie Filler, MD  glipiZIDE (GLUCOTROL XL) 5 MG 24 hr tablet Take 1 tablet (5 mg total) by mouth daily with breakfast. 09/24/15  Yes Eugenie Filler, MD  hydrALAZINE (APRESOLINE) 25 MG tablet Take 1 tablet (25 mg total)  by mouth 2 (two) times daily. 09/24/15  Yes Eugenie Filler, MD  insulin NPH-regular Human (NOVOLIN 70/30) (70-30) 100 UNIT/ML injection Inject 20 Units into the skin 2 (two) times daily with a meal. 09/24/15  Yes Eugenie Filler, MD  insulin regular (NOVOLIN R) 100 units/mL injection Inject 0.05 mLs (5 Units total) into the skin 3 (three) times daily before meals. 09/24/15  Yes Eugenie Filler, MD  isosorbide dinitrate (ISORDIL) 5 MG tablet Take 1 tablet (5 mg total) by mouth 2 (two) times daily. 09/24/15  Yes Eugenie Filler, MD  losartan (COZAAR) 25 MG tablet Take 1 tablet (25 mg total) by mouth daily. 09/24/15  Yes Eugenie Filler, MD  magnesium oxide (MAG-OX) 400 (241.3 Mg) MG tablet Take 1 tablet (400 mg total) by mouth 2 (two) times daily. 09/24/15  Yes Eugenie Filler, MD  omeprazole (PRILOSEC) 20 MG capsule Take 20 mg by mouth daily. 09/26/15  Yes Historical Provider, MD  oxyCODONE-acetaminophen (PERCOCET/ROXICET) 5-325 MG tablet Take 1 tablet by mouth every 8 (eight) hours as needed for moderate pain or severe pain. 09/24/15  Yes Eugenie Filler, MD  polyethylene glycol [facility] / GLYCOLAX) packet Take 17 g by mouth 2 (two) times daily. 04/27/15  Yes [name], NP  methocarbamol (  ROBAXIN) 500 MG tablet Take 1 tablet (500 mg total) by mouth 2 (two) times daily. 09/14/15   Tatyana Kirichenko, PA-C  ONE TOUCH ULTRA TEST test strip TEST four times a day 06/07/14   [name], NP  pantoprazole (PROTONIX) 40 MG tablet Take 1 tablet (40 mg total) by mouth 2 (two) times daily before a meal. Take 2 times daily x 2 months, then 1 time daily. 09/24/15   Eugenie Filler, MD    Physical Exam: Filed Vitals:   10/11/15 1845 10/11/15 1900 10/11/15 1915 10/11/15 2038  BP: 167/85 158/89 163/79 169/94  Pulse: 91 92 93 89  Temp:    100 F (37.8 C)  TempSrc:    Oral  Resp: 17 19 25 18   Height:    6' (1.829 m)  Weight:    98 kg (216 lb 0.8 oz)  SpO2: 95% 91% 93% 98%      Constitutional: NAD,  calm, comfortable Eyes: PERTLA, lids and conjunctivae normal ENMT: Mucous membranes are moist. Posterior pharynx clear of any exudate or lesions.   Neck: normal, supple, no masses, no thyromegaly Respiratory: clear to auscultation bilaterally, no wheezing, no crackles. Normal respiratory effort.  Cardiovascular: S1 & S2 heard, regular rate and rhythm, soft systolic murmur at LSB. No extremity edema. No significant JVD. Abdomen: No distension, no tenderness, no masses palpated. Bowel sounds normal.  Musculoskeletal: no clubbing / cyanosis. Left elbow edematous, warm, and tender. Normal muscle tone.  Skin: no significant rashes, lesions, ulcers. Warm, dry, well-perfused. Neurologic: CN 2-12 grossly intact. Sensation to light touch diminished in b/l LE's. Bilateral hip flexor strength 4/5, knee flexion 4/5, plantar flexion 3/5 on left, 4/5 on right.  Psychiatric: Normal judgment and insight. Alert and oriented x 3. Normal mood and affect.     Labs on Admission: I have personally reviewed following labs and imaging studies  CBC:  Recent Labs Lab 10/11/15 1114 10/11/15 1133  WBC 10.2  --   NEUTROABS 8.0*  --   HGB 12.4* 12.2*  HCT 36.3* 36.0*  MCV 86.8  --   PLT 259  --    Basic Metabolic Panel:  Recent Labs Lab 10/11/15 1114 10/11/15 1133  NA 137 140  K 3.3* 3.3*  CL 101 98*  CO2 26  --   GLUCOSE 233* 233*  BUN 10 11  CREATININE 0.90 0.70  CALCIUM 8.4*  --    GFR: Estimated Creatinine Clearance: 119.2 mL/min (by C-G formula based on Cr of 0.7). Liver Function Tests:  Recent Labs Lab 10/11/15 1114  AST 13*  ALT 9*  ALKPHOS 72  BILITOT 1.5*  PROT 7.8  ALBUMIN 2.9*   No results for input(s): LIPASE, AMYLASE in the last 168 hours. No results for input(s): AMMONIA in the last 168 hours. Coagulation Profile:  Recent Labs Lab 10/11/15 1114  INR 1.24   Cardiac Enzymes: No results for input(s): CKTOTAL, CKMB, CKMBINDEX, TROPONINI in the last 168 hours. BNP  (last 3 results) No results for input(s): PROBNP in the last 8760 hours. HbA1C: No results for input(s): HGBA1C in the last 72 hours. CBG:  Recent Labs Lab 10/11/15 1103  GLUCAP 237*   Lipid Profile: No results for input(s): CHOL, HDL, LDLCALC, TRIG, CHOLHDL, LDLDIRECT in the last 72 hours. Thyroid Function Tests: No results for input(s): TSH, T4TOTAL, FREET4, T3FREE, THYROIDAB in the last 72 hours. Anemia Panel: No results for input(s): VITAMINB12, FOLATE, FERRITIN, TIBC, IRON, RETICCTPCT in the last 72 hours. Urine analysis:  Component Value Date/Time   COLORURINE YELLOW 10/11/2015 1418   APPEARANCEUR CLEAR 10/11/2015 1418   LABSPEC 1.022 10/11/2015 1418   PHURINE 6.0 10/11/2015 1418   GLUCOSEU >1000* 10/11/2015 1418   HGBUR TRACE* 10/11/2015 1418   BILIRUBINUR NEGATIVE 10/11/2015 1418   BILIRUBINUR neg 02/21/2015 1720   KETONESUR 40* 10/11/2015 1418   PROTEINUR 100* 10/11/2015 1418   PROTEINUR 100 02/21/2015 1720   UROBILINOGEN >=8.0 02/21/2015 1720   NITRITE NEGATIVE 10/11/2015 1418   NITRITE neg 02/21/2015 1720   LEUKOCYTESUR NEGATIVE 10/11/2015 1418   Sepsis Labs: @LABRCNTIP (procalcitonin:4,lacticidven:4) )No results found for this or any previous visit (from the past 240 hour(s)).   Radiological Exams on Admission: Dg Elbow Complete Left  10/11/2015  CLINICAL DATA:  Left elbow pain 3 days.  No injury. EXAM: LEFT ELBOW - COMPLETE 3+ VIEW COMPARISON:  None. FINDINGS: Mild degenerative changes of the left elbow. There is displaced posterior anterior fat pad suggesting a significant joint effusion. No fracture or dislocation. IMPRESSION: Degenerative changes. Findings suggesting a significant joint effusion. Electronically Signed   By: Marin Olp M.D.   On: 10/11/2015 12:54   Ct Head Wo Contrast  10/11/2015  CLINICAL DATA:  Left all arm pain. EXAM: CT HEAD WITHOUT CONTRAST TECHNIQUE: Contiguous axial images were obtained from the base of the skull through the  vertex without intravenous contrast. COMPARISON:  09/21/2015. FINDINGS: There is no evidence for acute hemorrhage, hydrocephalus, mass lesion, or abnormal extra-axial fluid collection. No definite CT evidence for acute infarction. Diffuse loss of parenchymal volume is consistent with atrophy. Patchy low attenuation in the deep hemispheric and periventricular white matter is nonspecific, but likely reflects chronic microvascular ischemic demyelination. Opacification noted in scattered ethmoid air cells. Frontal and sphenoid sinuses are clear. Visualized portions of the maxillary sinuses are clear. No mastoid effusion. No evidence for middle air-fluid. No skull fracture. IMPRESSION: 1. No acute intracranial abnormality. 2. Atrophy with chronic small vessel white matter ischemic disease. Electronically Signed   By: Misty Stanley M.D.   On: 10/11/2015 11:39   Ct Lumbar Spine Wo Contrast  10/11/2015  CLINICAL DATA:  Bilateral lower extremity weakness, low back pain. History of prostate cancer, hypertension, diabetes and stroke. EXAM: CT LUMBAR SPINE WITHOUT CONTRAST TECHNIQUE: Multidetector CT imaging of the lumbar spine was performed without intravenous contrast administration. Multiplanar CT image reconstructions were also generated. COMPARISON:  CT abdomen and pelvis September 16, 2015 FINDINGS: SEGMENTATION: For the purposes of this report the last well-formed intervertebral disc space will be described as L5-S1. ALIGNMENT: Lumbar vertebral bodies in alignment, maintenance of the lumbar lordosis. OSSEOUS STRUCTURES: Lumbar vertebral bodies and posterior elements are intact. Developmentally non fused L1 transverse process. Mild L5-S1 disc height loss, endplate sclerosis and marginal spurring compatible with degenerative disc. Moderate T12-L1 degenerative disc. No destructive bony lesions. Mild sacroiliac osteoarthrosis. SOFT TISSUES: Included prevertebral and paraspinal soft tissues are nonacute, mild calcific  atherosclerosis of the included aorta. Level by level evaluation: L1-2 and L2-3: No significant disc bulge, canal stenosis or neural foraminal narrowing. L3-4: Small broad-based disc bulge. Mild facet arthropathy and ligamentum flavum redundancy without canal stenosis. Mild RIGHT neural foraminal narrowing. L4-5: Moderate broad-based disc bulge. Moderate to severe facet arthropathy and ligamentum flavum redundancy. Mild canal stenosis. Mild RIGHT greater than LEFT neural foraminal narrowing. L5-S1: Moderate broad-based disc bulge. Severe facet arthropathy and ligamentum flavum redundancy. No significant canal stenosis. Moderate RIGHT, moderate to severe LEFT neural foraminal narrowing. IMPRESSION: No acute fracture or malalignment. No CT findings of  metastatic disease in the lumbar spine given history of prostate cancer. Degenerative changes resulting in mild canal stenosis at L4-5. Neural foraminal narrowing L3-4 through L5-S1: Moderate to severe on the LEFT at L5-S1. Electronically Signed   By: Elon Alas M.D.   On: 10/11/2015 13:23    EKG: Independently reviewed. NSR, LVH with secondary repolarization abnormality   Assessment/Plan  1. Leg weakness, bilateral  - Pt reports LLE weakness following stroke, but states this has worsened along with new RLE weakness that has left him unable to ambulate - CT lumbar spine with significant degenerative changes, mild canal stenosis, and multi-level neural foraminal narrowing; no acute fx, malalignment, or metastasis seen  - Suspect the presentation related to progression of lumbar spine degenerative changes  - MRI requires presence of [employer] rep who was unavailable overnight, will plan for the am  - With aphasia reported, a cerebrovascular etiology was considered; case discussed with neurology who kindly accepts consultation request  2. Expressive aphasia, resolved  - Transient slurred speech reported on morning of admission, resolving spontaneously   - Uncertain etiology; neurology consulting and much appreciated  - Frequent neuro checks  - NPO until passes bedside swallow eval  - Continue secondary ppx with ASA  - Monitor on telemetry  - Follow-up neuro recs    3. Acute on chronic low back pain  - CT lumbar spine without acute fracture, malalignment, or metastasis  - Significant degenerative changes noted on CT with mild canal stenosis and neural foraminal narrowing  - Plan to follow-up with MRI in am when [employer] rep available to attend as required d/t his ICD  - Continue muscle relaxant and analgesia prn   4. Hx of CVA with left-sided weakness   - Discharged from hospital last month with daily ASA 325 for secondary prevention; DAPT not pursued d/t recent GIB and pt's financial constraints  - Continue prophylactic ASA, Lipitor, and control of RF's as below    5. Left elbow pain and swelling, atraumatic - Pt reports insidious development of pain and swelling 3-4 days ago, denies trauma - Radiographs with degenerative changes and effusion  - No fever or leukocytosis to suggest infectious etiology; pt denies hx of gout  - Check inflammatory markers, uric acid; follow-up labs, consider colchicine; may need aspiration if infectious s/s develop - Received dose of Solu-Medrol in ED, which may help if this is gout  6. Chronic combined systolic/diastolic CHF  - Stable, no s/s of vol overload  - TTE (09/22/2015) with EF [id number], grade 1 diastolic dysfunction, mild MR, mod TR, mild LVH, inferior hypokinesis  - Continue current management with Coreg, losartan    7. Type II DM  - A1c 11.8% in June 2017  - Managed at home with glipizide, Novolin 70/30, 20 units BID, and regular insulin 5 units TID; will hold these during hospitalization   - Check CBG with meals and qHS  - Start with Lantus 30 units qHS, Humalog 4 units TID, and moderate-intensity SS correctional   8. Hypertension  - At goal currently  - Continue current management with  Coreg, losartan, Isordil   9. Hypokalemia  - Serum potassium 3.3 on admission  - Likely related to pt's hx of hypomagnesemia  - 20 mEq potassium given IV  - Check mag level and replete prn  - Repeat chem panel in am    10. Bacteriuria  - Bacteria noted on UA, but appears contaminated and without leukocytes or nitrite - Pt denies dysuria, suprapubic pain,  or flank pain; there is no fever or leukocytosis  - Empiric Rocephin administered in ED  - Send urine for culture; watch off abx for now    DVT prophylaxis: sq heparin  Code Status: Full  Family Communication: Discussed with patient  Disposition Plan: Admit to telemetry  Consults called: Neurology  Admission status: Inpatient    Vianne Bulls, MD [facility] Pager [phone]  If 7PM-7AM, please contact night-coverage [email or web address] Password [facility]  10/11/2015, 8:41 PM

## 2015-10-11 NOTE — ED Notes (Signed)
Assisted with rectal exam, patient repositioned in bed

## 2015-10-11 NOTE — Progress Notes (Signed)
Pt admitted from ED with stroke like symptoms, alert and oriented, c/o of lower back pain and left elbow pain, pt settled in bed with call light at bedside, tele monitor put on pt and admitting doc paged for orders, pt reassured, will however continue to monitor. [place]-Asidi, Shaily Librizzi Efe

## 2015-10-11 NOTE — ED Notes (Signed)
Pt being brought back from mri due to patient having an AICD and they can not do his scan tonight, er md notified

## 2015-10-12 DIAGNOSIS — R8271 Bacteriuria: Secondary | ICD-10-CM | POA: Diagnosis not present

## 2015-10-12 DIAGNOSIS — E1142 Type 2 diabetes mellitus with diabetic polyneuropathy: Secondary | ICD-10-CM | POA: Diagnosis not present

## 2015-10-12 DIAGNOSIS — E876 Hypokalemia: Secondary | ICD-10-CM

## 2015-10-12 DIAGNOSIS — M25422 Effusion, left elbow: Secondary | ICD-10-CM | POA: Diagnosis not present

## 2015-10-12 LAB — BASIC METABOLIC PANEL
Anion gap: 11 (ref 5–15)
BUN: 14 mg/dL (ref 6–20)
CALCIUM: 8.1 mg/dL — AB (ref 8.9–10.3)
CHLORIDE: 98 mmol/L — AB (ref 101–111)
CO2: 27 mmol/L (ref 22–32)
CREATININE: 0.9 mg/dL (ref 0.61–1.24)
GFR calc Af Amer: >60 mL/min (ref >60)
GFR calc non Af Amer: >60 mL/min (ref >60)
Glucose, Bld: 296 mg/dL — ABNORMAL HIGH (ref 65–99)
Potassium: 3.4 mmol/L — ABNORMAL LOW (ref 3.5–5.1)
SODIUM: 136 mmol/L (ref 135–145)

## 2015-10-12 LAB — GLUCOSE, CAPILLARY
GLUCOSE-CAPILLARY: 286 mg/dL — AB (ref 65–99)
Glucose-Capillary: 303 mg/dL — ABNORMAL HIGH (ref 65–99)
Glucose-Capillary: 277 mg/dL — ABNORMAL HIGH (ref 65–99)
Glucose-Capillary: 269 mg/dL — ABNORMAL HIGH (ref 65–99)

## 2015-10-12 LAB — LIPID PANEL
Cholesterol: 96 mg/dL (ref 0–200)
HDL: 39 mg/dL — ABNORMAL LOW (ref >40)
LDL CALC: 48 mg/dL (ref 0–99)
Total CHOL/HDL Ratio: 2.5 RATIO
Triglycerides: 43 mg/dL (ref <150)
VLDL: 9 mg/dL (ref 0–40)

## 2015-10-12 LAB — MAGNESIUM: Magnesium: 1.3 mg/dL — ABNORMAL LOW (ref 1.7–2.4)

## 2015-10-12 MED ORDER — MAGNESIUM SULFATE 2 GM/50ML IV SOLN
2.0000 g | Freq: Once | INTRAVENOUS | Status: AC
  Administered 2015-10-13: 2 g via INTRAVENOUS
  Filled 2015-10-12: qty 50

## 2015-10-12 MED ORDER — POTASSIUM CHLORIDE CRYS ER 20 MEQ PO TBCR
40.0000 meq | EXTENDED_RELEASE_TABLET | Freq: Once | ORAL | Status: AC
  Administered 2015-10-12: 40 meq via ORAL
  Filled 2015-10-12: qty 2

## 2015-10-12 NOTE — Progress Notes (Signed)
[facility] PROGRESS NOTE  Thomas Sosa [id number] DOB: 06-Mar-1956 DOA: 10/11/2015 PCP: [name], NP  Assessment/Plan:  1. Leg weakness, bilateral  - Pt reports LLE weakness following stroke, but states this has worsened along with new RLE weakness that has left him unable to ambulate - CT lumbar spine with significant degenerative changes, mild canal stenosis, and multi-level neural foraminal narrowing; no acute fx, malalignment, or metastasis seen  - Suspect the presentation related to progression of lumbar spine degenerative changes  - MRI requires presence of [employer] rep who was unavailable overnight, will plan for the am 7/20 - With aphasia reported, a cerebrovascular etiology was considered; case discussed with neurology who kindly accepts consultation request  2. Expressive aphasia, resolved  - Transient slurred speech reported on morning of admission, resolving spontaneously  - Uncertain etiology; neurology consulting and much appreciated  - Frequent neuro checks  - NPO until passes bedside swallow eval  - Continue secondary ppx with ASA  - Monitor on telemetry  - Follow-up neuro recs   3. Acute on chronic low back pain  - CT lumbar spine without acute fracture, malalignment, or metastasis  - Significant degenerative changes noted on CT with mild canal stenosis and neural foraminal narrowing  - Plan to follow-up with MRI in am when [employer] rep available to attend as required d/t his ICD; tomorrow sch - Continue muscle relaxant and analgesia prn   4. Hx of CVA with left-sided weakness  - Discharged from hospital last month with daily ASA 325 for secondary prevention; DAPT not pursued d/t recent GIB and pt's financial constraints  - Continue prophylactic ASA, Lipitor, and control of RF's as below   5. Left elbow pain and swelling, atraumatic - Pt reports insidious development of pain and swelling 3-4 days ago, denies trauma - Radiographs  with degenerative changes and effusion  - No fever or leukocytosis to suggest infectious etiology; pt denies hx of gout  - Check inflammatory markers, uric acid; follow-up labs, consider colchicine; may need aspiration if infectious s/s develop - Received dose of Solu-Medrol in ED, which may help if this is gout  6. Chronic combined systolic/diastolic CHF  - Stable, no s/s of vol overload  - TTE (09/22/2015) with EF [id number], grade 1 diastolic dysfunction, mild MR, mod TR, mild LVH, inferior hypokinesis  - Continue current management with Coreg, losartan   7. Type II DM  - A1c 11.8% in June 2017  - Managed at home with glipizide, Novolin 70/30, 20 units BID, and regular insulin 5 units TID; will hold these during hospitalization  - Check CBG with meals and qHS  - Start with Lantus 30 units qHS, Humalog 4 units TID, and moderate-intensity SS correctional   8. Hypertension  - At goal currently  - Continue current management with Coreg, losartan, Isordil   9. Hypokalemia  - Serum potassium 3.3 on admission  - Likely related to pt's hx of hypomagnesemia  - 20 mEq potassium given IV on admit, oral given today - Check mag level and replete prn, given IV - Repeat chem panel in am   10. Bacteriuria  - Bacteria noted on UA, but appears contaminated and without leukocytes or nitrite - Pt denies dysuria, suprapubic pain, or flank pain; there is no fever or leukocytosis  - Empiric Rocephin administered in ED  - Send urine for culture; cont watch off abx for now    DVT prophylaxis: sq heparin  Code Status: Full  Family Communication: Discussed with patient  Disposition Plan: Admit to telemetry  Consults called: Neurology  Admission status: Inpatient    Consultants:  neuro  Procedures:  n/a  Antibiotics:  6/18 1g rocephin x1 (indicate start date, and stop date if known)  HPI/Subjective: Pt comfortable. Fluent speech. No compaints. No  pain.  Objective: Filed Vitals:   10/12/15 1823 10/12/15 2131  BP: 146/81 126/82  Pulse: 73 72  Temp: 98.1 F (36.7 C) 98.4 F (36.9 C)  Resp: 20 18    Intake/Output Summary (Last 24 hours) at 10/12/15 2230 Last data filed at 10/12/15 0433  Gross per 24 hour  Intake    430 ml  Output      0 ml  Net    430 ml   Filed Weights   01-Nov-2015 2038  Weight: 98 kg (216 lb 0.8 oz)    Exam:  General:  No diaphoresis, anxious, no acute distress Cardiovascular: Regular rate and rhythm no murmurs rubs or gallops Respiratory: Clear to auscultation bilaterally no more breathing Abdomen: Nondistended bowel sounds normal nontender palpation Musculoskeletal: Moving all extremities, no deformity, 5 out of 5 strength    Data Reviewed: Basic Metabolic Panel:  Recent Labs Lab 11/01/2015 1114 11-01-2015 1133 Nov 01, 2015 2047 10/12/15 0328 10/12/15 1402  NA 137 140  --  136  --   K 3.3* 3.3*  --  3.4*  --   CL 101 98*  --  98*  --   CO2 26  --   --  27  --   GLUCOSE 233* 233*  --  296*  --   BUN 10 11  --  14  --   CREATININE 0.90 0.70  --  0.90  --   CALCIUM 8.4*  --   --  8.1*  --   MG  --   --  1.1*  --  1.3*   Liver Function Tests:  Recent Labs Lab Nov 01, 2015 1114  AST 13*  ALT 9*  ALKPHOS 72  BILITOT 1.5*  PROT 7.8  ALBUMIN 2.9*   No results for input(s): LIPASE, AMYLASE in the last 168 hours. No results for input(s): AMMONIA in the last 168 hours. CBC:  Recent Labs Lab Nov 01, 2015 1114 2015-11-01 1133  WBC 10.2  --   NEUTROABS 8.0*  --   HGB 12.4* 12.2*  HCT 36.3* 36.0*  MCV 86.8  --   PLT 259  --    Cardiac Enzymes: No results for input(s): CKTOTAL, CKMB, CKMBINDEX, TROPONINI in the last 168 hours. BNP (last 3 results) No results for input(s): BNP in the last 8760 hours.  ProBNP (last 3 results) No results for input(s): PROBNP in the last 8760 hours.  CBG:  Recent Labs Lab 11-01-2015 2055 10/12/15 0632 10/12/15 1127 10/12/15 1635 10/12/15 2129  GLUCAP  223* 303* 286* 277* 269*    No results found for this or any previous visit (from the past 240 hour(s)).   Studies: Dg Elbow Complete Left  11-01-15  CLINICAL DATA:  Left elbow pain 3 days.  No injury. EXAM: LEFT ELBOW - COMPLETE 3+ VIEW COMPARISON:  None. FINDINGS: Mild degenerative changes of the left elbow. There is displaced posterior anterior fat pad suggesting a significant joint effusion. No fracture or dislocation. IMPRESSION: Degenerative changes. Findings suggesting a significant joint effusion. Electronically Signed   By: Marin Olp M.D.   On: November 01, 2015 12:54   Ct Head Wo Contrast  2015/11/01  CLINICAL DATA:  Left all arm pain. EXAM: CT HEAD WITHOUT CONTRAST TECHNIQUE: Contiguous axial  images were obtained from the base of the skull through the vertex without intravenous contrast. COMPARISON:  09/21/2015. FINDINGS: There is no evidence for acute hemorrhage, hydrocephalus, mass lesion, or abnormal extra-axial fluid collection. No definite CT evidence for acute infarction. Diffuse loss of parenchymal volume is consistent with atrophy. Patchy low attenuation in the deep hemispheric and periventricular white matter is nonspecific, but likely reflects chronic microvascular ischemic demyelination. Opacification noted in scattered ethmoid air cells. Frontal and sphenoid sinuses are clear. Visualized portions of the maxillary sinuses are clear. No mastoid effusion. No evidence for middle air-fluid. No skull fracture. IMPRESSION: 1. No acute intracranial abnormality. 2. Atrophy with chronic small vessel white matter ischemic disease. Electronically Signed   By: Misty Stanley M.D.   On: 10/11/2015 11:39   Ct Lumbar Spine Wo Contrast  10/11/2015  CLINICAL DATA:  Bilateral lower extremity weakness, low back pain. History of prostate cancer, hypertension, diabetes and stroke. EXAM: CT LUMBAR SPINE WITHOUT CONTRAST TECHNIQUE: Multidetector CT imaging of the lumbar spine was performed without  intravenous contrast administration. Multiplanar CT image reconstructions were also generated. COMPARISON:  CT abdomen and pelvis September 16, 2015 FINDINGS: SEGMENTATION: For the purposes of this report the last well-formed intervertebral disc space will be described as L5-S1. ALIGNMENT: Lumbar vertebral bodies in alignment, maintenance of the lumbar lordosis. OSSEOUS STRUCTURES: Lumbar vertebral bodies and posterior elements are intact. Developmentally non fused L1 transverse process. Mild L5-S1 disc height loss, endplate sclerosis and marginal spurring compatible with degenerative disc. Moderate T12-L1 degenerative disc. No destructive bony lesions. Mild sacroiliac osteoarthrosis. SOFT TISSUES: Included prevertebral and paraspinal soft tissues are nonacute, mild calcific atherosclerosis of the included aorta. Level by level evaluation: L1-2 and L2-3: No significant disc bulge, canal stenosis or neural foraminal narrowing. L3-4: Small broad-based disc bulge. Mild facet arthropathy and ligamentum flavum redundancy without canal stenosis. Mild RIGHT neural foraminal narrowing. L4-5: Moderate broad-based disc bulge. Moderate to severe facet arthropathy and ligamentum flavum redundancy. Mild canal stenosis. Mild RIGHT greater than LEFT neural foraminal narrowing. L5-S1: Moderate broad-based disc bulge. Severe facet arthropathy and ligamentum flavum redundancy. No significant canal stenosis. Moderate RIGHT, moderate to severe LEFT neural foraminal narrowing. IMPRESSION: No acute fracture or malalignment. No CT findings of metastatic disease in the lumbar spine given history of prostate cancer. Degenerative changes resulting in mild canal stenosis at L4-5. Neural foraminal narrowing L3-4 through L5-S1: Moderate to severe on the LEFT at L5-S1. Electronically Signed   By: Elon Alas M.D.   On: 10/11/2015 13:23    Scheduled Meds: . amitriptyline  50 mg Oral QHS  . aspirin EC  325 mg Oral Daily  . atorvastatin  80  mg Oral q1800  . carvedilol  12.5 mg Oral BID WC  . gabapentin  900 mg Oral TID  . heparin  5,000 Units Subcutaneous Q8H  . hydrALAZINE  25 mg Oral BID  . insulin aspart  0-15 Units Subcutaneous TID WC  . insulin aspart  4 Units Subcutaneous TID WC  . insulin glargine  30 Units Subcutaneous QHS  . isosorbide dinitrate  5 mg Oral BID  . losartan  25 mg Oral Daily  . magnesium oxide  400 mg Oral BID  . methocarbamol  500 mg Oral BID  . pantoprazole  40 mg Oral BID AC  . polyethylene glycol  17 g Oral BID   Continuous Infusions:   Principal Problem:   Lower extremity weakness Active Problems:   Type 2 diabetes mellitus with peripheral neuropathy ([place])  Systolic CHF, chronic (HCC)   Hemiparesis and alteration of sensations as late effects of stroke ([place])   Secondary hypertension, unspecified   Acute exacerbation of chronic low back pain   Hypokalemia   Asymptomatic bacteriuria   Swelling of left elbow joint   Leg weakness, bilateral    Time spent: [facility] Pager [phone]. If 7PM-7AM, please contact night-coverage at [email or web address], password [facility] 10/12/2015, 10:30 PM  LOS: 1 day

## 2015-10-12 NOTE — Care Management Important Message (Signed)
Important Message  Patient Details  Name: Thomas Sosa MRN: [id number] Date of Birth: 1955-05-13   Medicare Important Message Given:  Yes    Loann Quill 10/12/2015, 10:06 AM

## 2015-10-12 NOTE — Care Management CC44 (Signed)
Condition Code 44 Documentation Completed  Patient Details  Name: Thomas Sosa MRN: [id number] Date of Birth: [birth year]   Condition Code 44 given: yes   Patient signature on Condition Code 44 notice:   yes Documentation of 2 MD's agreement:   yes Code 44 added to claim:   yes    Rolm Baptise, RN 10/12/2015, 4:24 PM

## 2015-10-12 NOTE — Progress Notes (Signed)
MRI CALLED UNIT TO NOTIFY THAT SCANNER NEEDED FOR MR [facility] IS OUT OF SERVICE AND WILL BE DONE TOMORROW.

## 2015-10-13 ENCOUNTER — Observation Stay (HOSPITAL_COMMUNITY): Payer: Medicare HMO

## 2015-10-13 DIAGNOSIS — M25422 Effusion, left elbow: Secondary | ICD-10-CM | POA: Diagnosis not present

## 2015-10-13 DIAGNOSIS — R29898 Other symptoms and signs involving the musculoskeletal system: Secondary | ICD-10-CM | POA: Diagnosis not present

## 2015-10-13 LAB — BASIC METABOLIC PANEL
Anion gap: 6 (ref 5–15)
BUN: 24 mg/dL — ABNORMAL HIGH (ref 6–20)
CALCIUM: 8.1 mg/dL — AB (ref 8.9–10.3)
CO2: 28 mmol/L (ref 22–32)
CREATININE: 0.87 mg/dL (ref 0.61–1.24)
Chloride: 100 mmol/L — ABNORMAL LOW (ref 101–111)
Glucose, Bld: 220 mg/dL — ABNORMAL HIGH (ref 65–99)
Potassium: 3.6 mmol/L (ref 3.5–5.1)
SODIUM: 134 mmol/L — AB (ref 135–145)

## 2015-10-13 LAB — CBC WITH DIFFERENTIAL/PLATELET
Basophils Absolute: 0 10*3/uL (ref 0.0–0.1)
Basophils Relative: 0 %
EOS ABS: 0 10*3/uL (ref 0.0–0.7)
EOS PCT: 0 %
HCT: 31.2 % — ABNORMAL LOW (ref 39.0–52.0)
HEMOGLOBIN: 10.3 g/dL — AB (ref 13.0–17.0)
Lymphocytes Relative: 16 %
Lymphs Abs: 1.8 10*3/uL (ref 0.7–4.0)
MCH: 28.9 pg (ref 26.0–34.0)
MCHC: 33 g/dL (ref 30.0–36.0)
MCV: 87.4 fL (ref 78.0–100.0)
MONOS PCT: 7 %
Monocytes Absolute: 0.8 10*3/uL (ref 0.1–1.0)
NEUTROS PCT: 77 %
Neutro Abs: 8.7 10*3/uL — ABNORMAL HIGH (ref 1.7–7.7)
Platelets: 249 10*3/uL (ref 150–400)
RBC: 3.57 MIL/uL — ABNORMAL LOW (ref 4.22–5.81)
RDW: 12.5 % (ref 11.5–15.5)
WBC: 11.3 10*3/uL — AB (ref 4.0–10.5)

## 2015-10-13 LAB — GLUCOSE, CAPILLARY
GLUCOSE-CAPILLARY: 236 mg/dL — AB (ref 65–99)
GLUCOSE-CAPILLARY: 240 mg/dL — AB (ref 65–99)
GLUCOSE-CAPILLARY: 161 mg/dL — AB (ref 65–99)
Glucose-Capillary: 112 mg/dL — ABNORMAL HIGH (ref 65–99)

## 2015-10-13 LAB — HEMOGLOBIN A1C
HEMOGLOBIN A1C: 10.8 % — AB (ref 4.8–5.6)
Mean Plasma Glucose: 263 mg/dL

## 2015-10-13 LAB — MAGNESIUM: MAGNESIUM: 1.8 mg/dL (ref 1.7–2.4)

## 2015-10-13 MED ORDER — GADOBENATE DIMEGLUMINE 529 MG/ML IV SOLN
20.0000 mL | Freq: Once | INTRAVENOUS | Status: AC | PRN
  Administered 2015-10-13: 20 mL via INTRAVENOUS

## 2015-10-13 NOTE — Progress Notes (Signed)
Subjective: States speech is much better today and feels he is almost back to normal.  Exam: Filed Vitals:   10/13/15 0551 10/13/15 0907  BP: 105/60 97/61  Pulse: 71 72  Temp: 98 F (36.7 C) 97.8 F (36.6 C)  Resp: 18 20    HEENT-  Normocephalic, no lesions, without obvious abnormality.  Normal external eye and conjunctiva.    Normal  external ears. Normal external nose  Normal pharynx. Poor dentition Cardiovascular- regular rate and rhythm and S1, S2 normal, pulses palpable throughout   Lungs- chest clear, no wheezing, rales, normal symmetric air entry Abdomen- normal findings: bowel sounds normal Extremities- less then 2 second capillary refill and no edema Lymph-no adenopathy palpable Musculoskeletal-no deformity or swelling, left elbow with pain extension but full range on motion Skin-dry    Gen: In bed, NAD MS: awake, alert, speech fluent no dysarthria, follows commands CN:II-XII intact Motor:  RUE 5/5 LUE 4/5  RLE 5/5 LUE 5/5 Normal bulk and tone Sensory:  intact DTR: 2+ symmetric BUE, absent BLE, toes downgoing bilaterally  Pertinent Labs/Diagnostics: WBC 11.5  April Pugh, MSN, RN Neurology [phone]  He does have chronic left-sided weakness and slurred speech.  Impression: 59yo bm with recent admission for acute left-sided weakness and dysarthria.  Per the patient his symptoms resolved within a few days, but continued to have left elbow pain that was attributed to gout vs bursitis.    My suspicion is that he had unmasking of his previous stroke symptoms in the setting of his bursitis/gout. Certainly his ESR and CRP suggests that he had some type of inflammatory condition and any inflammation can cause unmasking of previous stroke deficits and I suspect that this is what is happening.  MRI of the lumbar spine has been ordered, and it may be reasonable to get an MRI given that his symptoms were slurred speech and additional left-sided weakness, though I think  this is relatively low yield.  Recommendations: 1) MRI brain and L-spine, if no acute findings then no further recommendations from neurology will sign off.  Roland Rack, MD [facility] [phone]  If 7pm- 7am, please page neurology on call as listed in [place].   10/13/2015, 9:40 AM

## 2015-10-13 NOTE — Progress Notes (Signed)
[facility] PROGRESS NOTE  Thomas Sosa [id number] DOB: Sep 09, 1955 DOA: 10/11/2015 PCP: [name], NP  Assessment/Plan:  1. Leg weakness, bilateral, improved - Pt reports LLE weakness following stroke, but states this has worsened along with new RLE weakness that has left him unable to ambulate - CT lumbar spine with significant degenerative changes, mild canal stenosis, and multi-level neural foraminal narrowing; no acute fx, malalignment, or metastasis seen  - Suspect the presentation related to progression of lumbar spine degenerative changes  - MRI requires presence of [employer] rep who was unavailable overnight, exam done waiting for read, discharge in the morning is negative - With aphasia reported, a cerebrovascular etiology was considered; case discussed with neurology who kindly accepted consultation request  2. Expressive aphasia, resolved  - Transient slurred speech reported on morning of admission, resolving spontaneously  - Uncertain etiology; neurology consulting and much appreciated  - Frequent neuro checks  - NPO until passes bedside swallow eval  - Continue secondary ppx with ASA  - Monitor on telemetry  - Follow-up neuro recs   3. Acute on chronic low back pain  - CT lumbar spine without acute fracture, malalignment, or metastasis  - Significant degenerative changes noted on CT with mild canal stenosis and neural foraminal narrowing  - Plan to follow-up with MRI in am when [employer] rep available to attend as required d/t his ICD; tomorrow sch - Continue muscle relaxant and analgesia prn   4. Hx of CVA with left-sided weakness  - Discharged from hospital last month with daily ASA 325 for secondary prevention; DAPT not pursued d/t recent GIB and pt's financial constraints  - Continue prophylactic ASA, Lipitor, and control of RF's as below   5. Left elbow pain and swelling, atraumatic - Pt reports insidious development of pain and  swelling 3-4 days ago, denies trauma - Radiographs with degenerative changes and effusion  - No fever or leukocytosis to suggest infectious etiology; pt denies hx of gout  - Check inflammatory markers, uric acid; follow-up labs, consider colchicine; may need aspiration if infectious s/s develop - Received dose of Solu-Medrol in ED, which may help if this is gout  6. Chronic combined systolic/diastolic CHF  - Stable, no s/s of vol overload  - TTE (09/22/2015) with EF [id number], grade 1 diastolic dysfunction, mild MR, mod TR, mild LVH, inferior hypokinesis  - Continue current management with Coreg, losartan   7. Type II DM  - A1c 11.8% in June 2017  - Managed at home with glipizide, Novolin 70/30, 20 units BID, and regular insulin 5 units TID; will hold these during hospitalization  - Check CBG with meals and qHS  - Start with Lantus 30 units qHS, Humalog 4 units TID, and moderate-intensity SS correctional   8. Hypertension  - At goal currently  - Continue current management with Coreg, losartan, Isordil   9. Hypokalemia  - Serum potassium 3.3 on admission  - Likely related to pt's hx of hypomagnesemia  - 20 mEq potassium given IV on admit, oral given today - Check mag level and replete prn, given IV - Repeat chem panel in am   10. Bacteriuria  - Bacteria noted on UA, but appears contaminated and without leukocytes or nitrite - Pt denies dysuria, suprapubic pain, or flank pain; there is no fever or leukocytosis  - Empiric Rocephin administered in ED  - Cont watch off abx for now    DVT prophylaxis: sq heparin  Code Status: Full  Family Communication: Discussed with  patient  Disposition Plan: Admit to telemetry  Consults called: Neurology  Admission status: Inpatient    Consultants:  neuro  Procedures:  n/a  Antibiotics:  6/18 1g rocephin x1 (indicate start date, and stop date if known)  HPI/Subjective: Pt comfortable. Fluent speech. No  compaints. No pain. No fevers.  Objective: Filed Vitals:   10/13/15 0551 10/13/15 0907  BP: 105/60 97/61  Pulse: 71 72  Temp: 98 F (36.7 C) 97.8 F (36.6 C)  Resp: 18 20    Intake/Output Summary (Last 24 hours) at 10/13/15 1940 Last data filed at 10/13/15 0800  Gross per 24 hour  Intake    240 ml  Output      0 ml  Net    240 ml   Filed Weights   10/11/15 2038  Weight: 98 kg (216 lb 0.8 oz)    Exam:  General:  No diaphoresis, anxious, no acute distress Cardiovascular: Regular rate and rhythm no murmurs rubs or gallops Respiratory: Clear to auscultation bilaterally no more breathing Abdomen: Nondistended bowel sounds normal nontender palpation Musculoskeletal: Moving all extremities, no deformity, 5 out of 5 strength    Data Reviewed: Basic Metabolic Panel:  Recent Labs Lab 10/11/15 1114 10/11/15 1133 10/11/15 2047 10/12/15 0328 10/12/15 1402 10/13/15 0218  NA 137 140  --  136  --  134*  K 3.3* 3.3*  --  3.4*  --  3.6  CL 101 98*  --  98*  --  100*  CO2 26  --   --  27  --  28  GLUCOSE 233* 233*  --  296*  --  220*  BUN 10 11  --  14  --  24*  CREATININE 0.90 0.70  --  0.90  --  0.87  CALCIUM 8.4*  --   --  8.1*  --  8.1*  MG  --   --  1.1*  --  1.3* 1.8   Liver Function Tests:  Recent Labs Lab 10/11/15 1114  AST 13*  ALT 9*  ALKPHOS 72  BILITOT 1.5*  PROT 7.8  ALBUMIN 2.9*   No results for input(s): LIPASE, AMYLASE in the last 168 hours. No results for input(s): AMMONIA in the last 168 hours. CBC:  Recent Labs Lab 10/11/15 1114 10/11/15 1133 10/13/15 0218  WBC 10.2  --  11.3*  NEUTROABS 8.0*  --  8.7*  HGB 12.4* 12.2* 10.3*  HCT 36.3* 36.0* 31.2*  MCV 86.8  --  87.4  PLT 259  --  249   Cardiac Enzymes: No results for input(s): CKTOTAL, CKMB, CKMBINDEX, TROPONINI in the last 168 hours. BNP (last 3 results) No results for input(s): BNP in the last 8760 hours.  ProBNP (last 3 results) No results for input(s): PROBNP in the last  8760 hours.  CBG:  Recent Labs Lab 10/12/15 1635 10/12/15 2129 10/13/15 0625 10/13/15 1114 10/13/15 1646  GLUCAP 277* 269* 236* 240* 161*    No results found for this or any previous visit (from the past 240 hour(s)).   Studies: Mr Herby Abraham Contrast  10/13/2015  CLINICAL DATA:  60 y/o  M; bilateral lower extremity weakness. EXAM: MRI HEAD WITHOUT CONTRAST TECHNIQUE: Multiplanar, multiecho pulse sequences of the brain and surrounding structures were obtained without intravenous contrast. COMPARISON:  CT of the head dated 10/11/2015 and MRI of brain dated 10/10/2014. FINDINGS: Brain: No diffusion restriction to suggest acute infarct. No abnormal susceptibility hypointensity to indicate intracranial hemorrhage. There is mild parenchymal volume loss  and moderate chronic microvascular ischemic changes. There is a small right lateral frontal, and several right frontal deep white matter, single right parietal periventricular white matter, and small bilateral cerebellar hemisphere chronic infarcts. Several of these infarcts correspond to acute infarct on the prior MRI. Extra-axial space: Normal ventricular size. No midline shift. No effacement of basilar cisterns. No extra-axial collection is identified. Proximal intracranial flow voids are maintained. No abnormality of the cervical medullary junction. Other: Partial opacification of left-greater-than-right ethmoid air cells and the right frontal sinus. Mild mucosal thickening of sphenoid sinuses and left frontal sinus. No abnormal signal of the mastoid air cells. Orbits are unremarkable. Calvarium is unremarkable. IMPRESSION: 1. No evidence of acute infarct, mass effect, or intracranial hemorrhage. 2. Moderate chronic microvascular ischemic changes mild parenchymal volume loss. Several small chronic infarcts as described. 3. Moderate paranasal sinus disease, probably stable from the prior CT given differences in technique. Electronically Signed   By:  Kristine Garbe M.D.   On: 10/13/2015 19:16    Scheduled Meds: . amitriptyline  50 mg Oral QHS  . aspirin EC  325 mg Oral Daily  . atorvastatin  80 mg Oral q1800  . carvedilol  12.5 mg Oral BID WC  . gabapentin  900 mg Oral TID  . heparin  5,000 Units Subcutaneous Q8H  . hydrALAZINE  25 mg Oral BID  . insulin aspart  0-15 Units Subcutaneous TID WC  . insulin aspart  4 Units Subcutaneous TID WC  . insulin glargine  30 Units Subcutaneous QHS  . isosorbide dinitrate  5 mg Oral BID  . losartan  25 mg Oral Daily  . methocarbamol  500 mg Oral BID  . pantoprazole  40 mg Oral BID AC  . polyethylene glycol  17 g Oral BID   Continuous Infusions:   Principal Problem:   Lower extremity weakness Active Problems:   Type 2 diabetes mellitus with peripheral neuropathy (HCC)   Systolic CHF, chronic (HCC)   Hemiparesis and alteration of sensations as late effects of stroke ([place])   Secondary hypertension, unspecified   Acute exacerbation of chronic low back pain   Hypokalemia   Asymptomatic bacteriuria   Swelling of left elbow joint   Leg weakness, bilateral    Time spent: [facility] Pager [phone]. If 7PM-7AM, please contact night-coverage at [email or web address], password [facility] 10/13/2015, 7:40 PM  LOS: 2 days

## 2015-10-14 DIAGNOSIS — R8271 Bacteriuria: Secondary | ICD-10-CM | POA: Diagnosis not present

## 2015-10-14 DIAGNOSIS — I714 Abdominal aortic aneurysm, without rupture, unspecified: Secondary | ICD-10-CM | POA: Diagnosis present

## 2015-10-14 LAB — GLUCOSE, CAPILLARY
GLUCOSE-CAPILLARY: 130 mg/dL — AB (ref 65–99)
Glucose-Capillary: 169 mg/dL — ABNORMAL HIGH (ref 65–99)

## 2015-10-14 NOTE — Progress Notes (Signed)
Telemetry removed, pt understands all of discharge instructions. He states he called a friend to take him home

## 2015-10-14 NOTE — Discharge Summary (Signed)
Physician Discharge Summary  Thomas Sosa [id number] DOB: 1955/04/29 DOA: 10/11/2015  PCP: [name], NP  Admit date: 10/11/2015 Discharge date: 10/25/2015  Time spent: 45 minutes  Recommendations for Outpatient Follow-up:  1. f/u with PCP   Discharge Diagnoses:  Principal Problem:   Lower extremity weakness Active Problems:   Type 2 diabetes mellitus with peripheral neuropathy (HCC)   Systolic CHF, chronic (HCC)   Hemiparesis and alteration of sensations as late effects of stroke ([place])   Secondary hypertension, unspecified   Acute exacerbation of chronic low back pain   Hypokalemia   Asymptomatic bacteriuria   Swelling of left elbow joint   Leg weakness, bilateral   AAA (abdominal aortic aneurysm) without rupture [facility])   Discharge Condition: stable  Diet recommendation: low salt carb control  Filed Weights   10/11/15 2038  Weight: 98 kg (216 lb 0.8 oz)    History of present illness:   Thomas Sosa is a 60 y.o. male with medical history significant for chronic systolic CHF, CAD status post remote CABG, insulin-dependent diabetes mellitus, hypertension, chronic low back pain, prostate cancer status post brachytherapy, and chronic systolic CHF who presents to the emergency department with 3-4 days of acute on chronic low back pain, bilateral lower extremity weakness, nontraumatic swelling and pain of the left elbow, and transient slurred speech which has resolved. Patient was admitted to this institution last month with GI bleed localized to erosive esophagitis, and also was managed for acute left leg weakness which was attributed to likely acute stroke. MRI was precluded by pacer, and CTA of the head and neck demonstrated significant stenoses bilaterally. Patient was discharged on Protonix twice daily for the esophagitis, and full dose aspirin daily for secondary stroke prevention. He had initially done well back at home until approximately 3-4 days ago when he noted the  insidious onset of bilateral lower extremity weakness with associated acute exacerbation in his chronic low back pain. Pain is the same in character and location as his chronic symptoms, just with increased severity. Described as constant deep ache in the central low back without radiation, exacerbated by movement, and with no alleviating factors identified. Patient denies radiation of pain to either leg, reports chronic bilateral lower extremity paresthesia secondary to diabetic neuropathy, but reports that the weakness has left him unable to ambulate. Over the same interval, his left elbow has become swollen, warm, and tender. He denies any trauma to the elbow, denies prior experience with similar symptoms, and denies any history of gout. Patient denies fevers or chills. He denies headache, change in vision or hearing, confusion, or loss of correlation.   Hospital Course:   Leg weakness, bilateral, improved - Pt reports LLE weakness following stroke, but states this has worsened along with new RLE weakness that has left him unable to ambulate - CT lumbar spine with significant degenerative changes, mild canal stenosis, and multi-level neural foraminal narrowing; no acute fx, malalignment, or metastasis seen  - Suspect the presentation related to progression of lumbar spine degenerative changes  - MRI requires presence of [employer] rep who was unavailable overnight, exam done neg, d/c - With aphasia reported, a cerebrovascular etiology was considered; case discussed with neurology who kindly accepted consultation request  2. Expressive aphasia, resolved  - Transient slurred speech reported on morning of admission, resolving spontaneously  - Uncertain etiology; neurology consulting and much appreciated  - Frequent neuro checks  - NPO until passes bedside swallow eval, cleared - Continue secondary ppx with ASA  -  Monitor on telemetry  - Follow-up neuro recs, advised d/c  3. Acute on  chronic low back pain  - CT lumbar spine without acute fracture, malalignment, or metastasis  - Significant degenerative changes noted on CT with mild canal stenosis and neural foraminal narrowing  - Plan to follow-up with MRI in am when [employer] rep available to attend as required d/t his ICD; tomorrow sch - Continue muscle relaxant and analgesia prn   4. Hx of CVA with left-sided weakness  - Discharged from hospital last month with daily ASA 325 for secondary prevention; DAPT not pursued d/t recent GIB and pt's financial constraints  - Continue prophylactic ASA, Lipitor, and control of RF's as below   5. Left elbow pain and swelling, atraumatic - Pt reports insidious development of pain and swelling 3-4 days ago, denies trauma - Radiographs with degenerative changes and effusion  - No fever or leukocytosis to suggest infectious etiology; pt denies hx of gout  - Check inflammatory markers, uric acid; follow-up labs, consider colchicine; may need aspiration if infectious s/s develop - Received dose of Solu-Medrol in ED, which may help if this is gout - Improved prior to d/c  6. Chronic combined systolic/diastolic CHF  - Stable, no s/s of vol overload  - TTE (09/22/2015) with EF [id number], grade 1 diastolic dysfunction, mild MR, mod TR, mild LVH, inferior hypokinesis  - Continue current management with Coreg, losartan   7. Type II DM  - A1c 11.8% in June 2017  - Managed at home with glipizide, Novolin 70/30, 20 units BID, and regular insulin 5 units TID; will hold these during hospitalization  - Check CBG with meals and qHS  - Lantus 30 units qHS, Humalog 4 units TID, and moderate-intensity SS correctional   8. Hypertension  - At goal - Continue current management with Coreg, losartan, Isordil   9. Hypokalemia  - Serum potassium 3.3 on admission  - Likely related to pt's hx of hypomagnesemia  - replaced IV and PO - Check mag level and replete prn, given  IV  10. Bacteriuria  - Bacteria noted on UA, but appears contaminated and without leukocytes or nitrite - Pt denies dysuria, suprapubic pain, or flank pain; there is no fever or leukocytosis  - Empiric Rocephin administered in ED  - Cont to watch off abx for now   Procedures:  N/a (i.e. Studies not automatically included, echos, thoracentesis, etc; not x-rays)  Consultations:  neuro  Discharge Exam: Vitals:   10/14/15 1017 10/14/15 1402  BP: (!) 101/51 110/65  Pulse: 72 72  Resp: 18 18  Temp: 97.7 F (36.5 C) 98 F (36.7 C)     General:  No diaphoresis, anxious, no acute distress  Cardiovascular: Regular rate and rhythm no murmurs rubs or gallops  Respiratory: Clear to auscultation bilaterally no more breathing  Abdomen: Nondistended bowel sounds normal nontender palpation  Musculoskeletal: Moving all extremities, no deformity, 5 out of 5 strength    Discharge Instructions    Discharge Medication List as of 10/14/2015  2:16 PM    CONTINUE these medications which have NOT CHANGED   Details  amitriptyline (ELAVIL) 50 MG tablet Take 1 tablet (50 mg total) by mouth at bedtime., Starting 09/24/2015, Until Discontinued, Print    aspirin EC 325 MG EC tablet Take 1 tablet (325 mg total) by mouth daily., Starting 09/24/2015, Until Discontinued, No Print    atorvastatin (LIPITOR) 80 MG tablet Take 1 tablet (80 mg total) by mouth daily., Starting 09/24/2015, Until  Discontinued, Print    carvedilol (COREG) 12.5 MG tablet Take 1 tablet (12.5 mg total) by mouth 2 (two) times daily., Starting 09/24/2015, Until Discontinued, Print    gabapentin (NEURONTIN) 600 MG tablet Take 1.5 tablets (900 mg total) by mouth 3 (three) times daily. Must have office visit for refills, Starting 09/24/2015, Until Discontinued, Print    glipiZIDE (GLUCOTROL XL) 5 MG 24 hr tablet Take 1 tablet (5 mg total) by mouth daily with breakfast., Starting 09/24/2015, Until Discontinued, Print    hydrALAZINE  (APRESOLINE) 25 MG tablet Take 1 tablet (25 mg total) by mouth 2 (two) times daily., Starting 09/24/2015, Until Discontinued, Print    insulin NPH-regular Human (NOVOLIN 70/30) (70-30) 100 UNIT/ML injection Inject 20 Units into the skin 2 (two) times daily with a meal., Starting 09/24/2015, Until Discontinued, Print    insulin regular (NOVOLIN R) 100 units/mL injection Inject 0.05 mLs (5 Units total) into the skin 3 (three) times daily before meals., Starting 09/24/2015, Until Discontinued, Print    isosorbide dinitrate (ISORDIL) 5 MG tablet Take 1 tablet (5 mg total) by mouth 2 (two) times daily., Starting 09/24/2015, Until Discontinued, Print    losartan (COZAAR) 25 MG tablet Take 1 tablet (25 mg total) by mouth daily., Starting 09/24/2015, Until Discontinued, Print    methocarbamol (ROBAXIN) 500 MG tablet Take 1 tablet (500 mg total) by mouth 2 (two) times daily., Starting 09/14/2015, Until Discontinued, Print    ONE TOUCH ULTRA TEST test strip TEST four times a day, Normal    oxyCODONE-acetaminophen (PERCOCET/ROXICET) 5-325 MG tablet Take 1 tablet by mouth every 8 (eight) hours as needed for moderate pain or severe pain., Starting 09/24/2015, Until Discontinued, Print    pantoprazole (PROTONIX) 40 MG tablet Take 1 tablet (40 mg total) by mouth 2 (two) times daily before a meal. Take 2 times daily x 2 months, then 1 time daily., Starting 09/24/2015, Until Discontinued, Print    polyethylene glycol (MIRALAX / GLYCOLAX) packet Take 17 g by mouth 2 (two) times daily., Starting 04/27/2015, Until Discontinued, Normal      STOP taking these medications     magnesium oxide (MAG-OX) 400 (241.3 Mg) MG tablet      omeprazole (PRILOSEC) 20 MG capsule        Allergies  Allergen Reactions  . Lisinopril Cough   Follow-up Information    [name], NP In 1 week.   Specialty:  Internal Medicine Why:  discuss restarting magnesium,  discuss AAA, discuss stoping tobacco use,  discuss hospital visit for  weakness in legs           The results of significant diagnostics from this hospitalization (including imaging, microbiology, ancillary and laboratory) are listed below for reference.    Significant Diagnostic Studies: Dg Elbow Complete Left  Result Date: 10/11/2015 CLINICAL DATA:  Left elbow pain 3 days.  No injury. EXAM: LEFT ELBOW - COMPLETE 3+ VIEW COMPARISON:  None. FINDINGS: Mild degenerative changes of the left elbow. There is displaced posterior anterior fat pad suggesting a significant joint effusion. No fracture or dislocation. IMPRESSION: Degenerative changes. Findings suggesting a significant joint effusion. Electronically Signed   By: Marin Olp M.D.   On: 10/11/2015 12:54   Dg Elbow Complete Right  Result Date: 10/24/2015 CLINICAL DATA:  Right elbow pain for 2 days.  No known injury. EXAM: RIGHT ELBOW - COMPLETE 3+ VIEW COMPARISON:  Radiographs 09/14/2015 FINDINGS: No acute fracture, dislocation, or bony destructive change. Ulna trabecular joint space narrowing and spurring is  again seen. Previous chondrocalcinosis is currently obscured due to positioning. Posterior soft tissue edema. There is a joint effusion. IMPRESSION: 1. Joint effusion.  Posterior soft tissue edema. 2. Mild degenerative change. Previous chondrocalcinosis not well visualized due to positioning. No acute bony abnormalities. Electronically Signed   By: Jeb Levering M.D.   On: 10/24/2015 00:59  Ct Head Wo Contrast  Result Date: 10/11/2015 CLINICAL DATA:  Left all arm pain. EXAM: CT HEAD WITHOUT CONTRAST TECHNIQUE: Contiguous axial images were obtained from the base of the skull through the vertex without intravenous contrast. COMPARISON:  09/21/2015. FINDINGS: There is no evidence for acute hemorrhage, hydrocephalus, mass lesion, or abnormal extra-axial fluid collection. No definite CT evidence for acute infarction. Diffuse loss of parenchymal volume is consistent with atrophy. Patchy low attenuation in the  deep hemispheric and periventricular white matter is nonspecific, but likely reflects chronic microvascular ischemic demyelination. Opacification noted in scattered ethmoid air cells. Frontal and sphenoid sinuses are clear. Visualized portions of the maxillary sinuses are clear. No mastoid effusion. No evidence for middle air-fluid. No skull fracture. IMPRESSION: 1. No acute intracranial abnormality. 2. Atrophy with chronic small vessel white matter ischemic disease. Electronically Signed   By: Misty Stanley M.D.   On: 10/11/2015 11:39   Ct Lumbar Spine Wo Contrast  Result Date: 10/11/2015 CLINICAL DATA:  Bilateral lower extremity weakness, low back pain. History of prostate cancer, hypertension, diabetes and stroke. EXAM: CT LUMBAR SPINE WITHOUT CONTRAST TECHNIQUE: Multidetector CT imaging of the lumbar spine was performed without intravenous contrast administration. Multiplanar CT image reconstructions were also generated. COMPARISON:  CT abdomen and pelvis September 16, 2015 FINDINGS: SEGMENTATION: For the purposes of this report the last well-formed intervertebral disc space will be described as L5-S1. ALIGNMENT: Lumbar vertebral bodies in alignment, maintenance of the lumbar lordosis. OSSEOUS STRUCTURES: Lumbar vertebral bodies and posterior elements are intact. Developmentally non fused L1 transverse process. Mild L5-S1 disc height loss, endplate sclerosis and marginal spurring compatible with degenerative disc. Moderate T12-L1 degenerative disc. No destructive bony lesions. Mild sacroiliac osteoarthrosis. SOFT TISSUES: Included prevertebral and paraspinal soft tissues are nonacute, mild calcific atherosclerosis of the included aorta. Level by level evaluation: L1-2 and L2-3: No significant disc bulge, canal stenosis or neural foraminal narrowing. L3-4: Small broad-based disc bulge. Mild facet arthropathy and ligamentum flavum redundancy without canal stenosis. Mild RIGHT neural foraminal narrowing. L4-5:  Moderate broad-based disc bulge. Moderate to severe facet arthropathy and ligamentum flavum redundancy. Mild canal stenosis. Mild RIGHT greater than LEFT neural foraminal narrowing. L5-S1: Moderate broad-based disc bulge. Severe facet arthropathy and ligamentum flavum redundancy. No significant canal stenosis. Moderate RIGHT, moderate to severe LEFT neural foraminal narrowing. IMPRESSION: No acute fracture or malalignment. No CT findings of metastatic disease in the lumbar spine given history of prostate cancer. Degenerative changes resulting in mild canal stenosis at L4-5. Neural foraminal narrowing L3-4 through L5-S1: Moderate to severe on the LEFT at L5-S1. Electronically Signed   By: Elon Alas M.D.   On: 10/11/2015 13:23   Mr Brain Wo Contrast  Result Date: 10/13/2015 CLINICAL DATA:  60 y/o  M; bilateral lower extremity weakness. EXAM: MRI HEAD WITHOUT CONTRAST TECHNIQUE: Multiplanar, multiecho pulse sequences of the brain and surrounding structures were obtained without intravenous contrast. COMPARISON:  CT of the head dated 10/11/2015 and MRI of brain dated 10/10/2014. FINDINGS: Brain: No diffusion restriction to suggest acute infarct. No abnormal susceptibility hypointensity to indicate intracranial hemorrhage. There is mild parenchymal volume loss and moderate chronic microvascular ischemic changes. There  is a small right lateral frontal, and several right frontal deep white matter, single right parietal periventricular white matter, and small bilateral cerebellar hemisphere chronic infarcts. Several of these infarcts correspond to acute infarct on the prior MRI. Extra-axial space: Normal ventricular size. No midline shift. No effacement of basilar cisterns. No extra-axial collection is identified. Proximal intracranial flow voids are maintained. No abnormality of the cervical medullary junction. Other: Partial opacification of left-greater-than-right ethmoid air cells and the right frontal  sinus. Mild mucosal thickening of sphenoid sinuses and left frontal sinus. No abnormal signal of the mastoid air cells. Orbits are unremarkable. Calvarium is unremarkable. IMPRESSION: 1. No evidence of acute infarct, mass effect, or intracranial hemorrhage. 2. Moderate chronic microvascular ischemic changes mild parenchymal volume loss. Several small chronic infarcts as described. 3. Moderate paranasal sinus disease, probably stable from the prior CT given differences in technique. Electronically Signed   By: Kristine Garbe M.D.   On: 10/13/2015 19:16   Mr Lumbar Spine W Wo Contrast  Result Date: 10/13/2015 CLINICAL DATA:  60 year old male with bilateral lower extremity weakness. Chronic lumbar back pain. No recent injury. Initial encounter. EXAM: MRI LUMBAR SPINE WITHOUT AND WITH CONTRAST TECHNIQUE: Multiplanar and multiecho pulse sequences of the lumbar spine were obtained without and with intravenous contrast. CONTRAST:  31mL MULTIHANCE GADOBENATE DIMEGLUMINE 529 MG/ML IV SOLN COMPARISON:  Lumbar spine CT 10/11/2015. CT Abdomen and Pelvis 09/16/2015. FINDINGS: Segmentation:  Normal as seen on the recent CT. Alignment: Stable from the recent CT. Subtle anterolisthesis at L4-L5 and L5-S1. Mildly exaggerated lumbar lordosis overall. Vertebrae: Degenerative appearing endplate changes at [id number], and anteriorly in the lower lumbar spine with mild marrow edema. Chronic concave endplate deformities throughout much of the lumbar spine. The lower thoracic levels are less affected. No bona fide compression fracture. No acute osseous abnormality identified. Conus medullaris: Extends to the L1 level and appears normal. No abnormal intradural enhancement. Paraspinal and other soft tissues: Posterior paraspinal soft tissues appear essentially normal for age. Ectatic infrarenal abdominal aorta measuring up to 32 mm diameter, stable from June CT Abdomen and Pelvis. Trace nonspecific perinephric fluid. Otherwise  negative visualized abdominal viscera. Disc levels: T11-T12: Mild disc bulge. Mild facet hypertrophy. Mild left T11 foraminal stenosis. T12-L1: Disc desiccation and disc space loss. Anterior eccentric circumferential disc osteophyte complex. Mild facet hypertrophy. No significant stenosis. L1-L2: Negative disc. Mild facet hypertrophy with trace facet joint fluid. No stenosis. L2-L3: Negative disc. Mild facet and ligament flavum hypertrophy. Trace facet joint fluid. No stenosis. L3-L4: Mild circumferential disc bulge. Mild to moderate facet and ligament flavum hypertrophy. Trace facet joint fluid. Borderline to mild L3 foraminal stenosis greater on the right. L4-L5: Mild circumferential disc bulge. Moderate to severe facet hypertrophy. Bilateral facet joint fluid. No spinal or lateral recess stenosis. Mild bilateral L4 foraminal stenosis. L5-S1: Circumferential disc bulge eccentric to the left. Left far lateral endplate osteophytes. Severe facet hypertrophy. Bilateral facet joint fluid. Small bilateral synovial cyst arising anteriorly (series 6, image 41). No spinal or lateral recess stenosis. The left side synovial cyst does contribute to mild to moderate left L5 foraminal stenosis as seen on series 4, image 12. IMPRESSION: 1. No acute osseous abnormality in the lumbar spine. Chronic or congenital concave deformities of the lumbar vertebrae, while the visible lower thoracic levels appear spared. Largely normal for age lumbar discs. 2. Mild lower lumbar anterolisthesis associated with severe facet arthropathy at L4-L5 and L5-S1. Still, no lumbar spinal stenosis results, however, there is mild to moderate L4 and  L5 neural foraminal stenosis. 3. Borderline to mild infrarenal abdominal aortic aneurysm, stable since 09/16/2015. Recommend followup by Ultrasound in 3 years. This recommendation follows ACR consensus guidelines: White Paper of the ACR Incidental Findings Committee II on Vascular Findings. [facility]  2013; 10:789-794 Electronically Signed   By: Genevie Ann M.D.   On: 10/13/2015 19:53    Microbiology: No results found for this or any previous visit (from the past 240 hour(s)).   Labs: Basic Metabolic Panel:  Recent Labs Lab 10/24/15 0116  NA 139  K 3.1*  CL 100*  CO2 31  GLUCOSE 246*  BUN 9  CREATININE 0.75  CALCIUM 8.5*   Liver Function Tests:  Recent Labs Lab 10/24/15 0116  AST 11*  ALT 11*  ALKPHOS 72  BILITOT 0.8  PROT 7.2  ALBUMIN 2.7*   No results for input(s): LIPASE, AMYLASE in the last 168 hours. No results for input(s): AMMONIA in the last 168 hours. CBC:  Recent Labs Lab 10/24/15 0116  WBC 10.0  NEUTROABS 7.7  HGB 11.7*  HCT 35.2*  MCV 87.1  PLT 311   Cardiac Enzymes: No results for input(s): CKTOTAL, CKMB, CKMBINDEX, TROPONINI in the last 168 hours. BNP: BNP (last 3 results) No results for input(s): BNP in the last 8760 hours.  ProBNP (last 3 results) No results for input(s): PROBNP in the last 8760 hours.  CBG: No results for input(s): GLUCAP in the last 168 hours.     Signed:  Elwin Mocha MD  FACP  [facility] 10/25/2015, 1:50 PM

## 2015-10-14 NOTE — Discharge Instructions (Signed)
Neurology feels your symptoms were an unmasking of your previous stroke by your elbow inflammation.

## 2015-10-14 NOTE — Progress Notes (Signed)
Pt to go home via wheelchair pt signed and understood all of discharge instructions. IV d/c'd prior to discharge

## 2015-10-23 ENCOUNTER — Emergency Department (HOSPITAL_COMMUNITY)
Admission: EM | Admit: 2015-10-23 | Discharge: 2015-10-24 | Disposition: A | Discharge status: Home / Self Care | Payer: Medicare HMO | Attending: Emergency Medicine | Admitting: Emergency Medicine

## 2015-10-23 ENCOUNTER — Encounter (HOSPITAL_COMMUNITY): Payer: Self-pay | Admitting: Emergency Medicine

## 2015-10-23 DIAGNOSIS — Z79899 Other long term (current) drug therapy: Secondary | ICD-10-CM | POA: Insufficient documentation

## 2015-10-23 DIAGNOSIS — I2510 Atherosclerotic heart disease of native coronary artery without angina pectoris: Secondary | ICD-10-CM | POA: Diagnosis not present

## 2015-10-23 DIAGNOSIS — Z7984 Long term (current) use of oral hypoglycemic drugs: Secondary | ICD-10-CM | POA: Insufficient documentation

## 2015-10-23 DIAGNOSIS — M79601 Pain in right arm: Secondary | ICD-10-CM

## 2015-10-23 DIAGNOSIS — Z8673 Personal history of transient ischemic attack (TIA), and cerebral infarction without residual deficits: Secondary | ICD-10-CM | POA: Insufficient documentation

## 2015-10-23 DIAGNOSIS — F17200 Nicotine dependence, unspecified, uncomplicated: Secondary | ICD-10-CM | POA: Diagnosis not present

## 2015-10-23 DIAGNOSIS — M5440 Lumbago with sciatica, unspecified side: Secondary | ICD-10-CM | POA: Diagnosis not present

## 2015-10-23 DIAGNOSIS — Z7982 Long term (current) use of aspirin: Secondary | ICD-10-CM | POA: Insufficient documentation

## 2015-10-23 DIAGNOSIS — E1140 Type 2 diabetes mellitus with diabetic neuropathy, unspecified: Secondary | ICD-10-CM | POA: Insufficient documentation

## 2015-10-23 DIAGNOSIS — Z96653 Presence of artificial knee joint, bilateral: Secondary | ICD-10-CM | POA: Diagnosis not present

## 2015-10-23 DIAGNOSIS — M25521 Pain in right elbow: Secondary | ICD-10-CM | POA: Diagnosis present

## 2015-10-23 DIAGNOSIS — M5441 Lumbago with sciatica, right side: Secondary | ICD-10-CM

## 2015-10-23 DIAGNOSIS — Z794 Long term (current) use of insulin: Secondary | ICD-10-CM | POA: Insufficient documentation

## 2015-10-23 DIAGNOSIS — I10 Essential (primary) hypertension: Secondary | ICD-10-CM | POA: Diagnosis not present

## 2015-10-23 NOTE — ED Provider Notes (Signed)
[facility] DEPT Provider Note   CSN: [id number] Arrival date & time: 10/23/15  2325   First MD Initiated Contact with Patient 10/23/15 2352     By signing my name below, I, Jasmyn B. Alexander, attest that this documentation has been prepared under the direction and in the presence of Merryl Hacker, MD.  Electronically Signed: Tedra Coupe. Sheppard Coil, ED Scribe. 10/23/15. 12:23 AM.   History   Chief Complaint Chief Complaint  Patient presents with  . Arm Pain    HPI HPI Comments: Thomas Sosa is a 60 y.o. male brought in by ambulance, with PMHx of CAD, DM, HTN, CVA, and Neuropathy who presents to the Emergency Department complaining of gradual onset, constant, worsening, 10/10 right elbow pain x 2 days. Denies any recent injury. Pt has associated weakness of the right arm. He is right-handed. Pain is exacerbated with movement of right arm. Pt also complains of lower back pain x 1 month. Pt takes Warfarin and ASA. Reports difficulty walking secondary to pain/weakness in bilateral extremities. He was recently seen, admitted and discharged for weakness.  Denies any numbness, tingling in bilateral extremities, nausea, vomiting, diarrhea, abdominal pain, chest pain, shortness of breath, fever, dysuria, bowel/urinary incontinence. Pt intermittently smokes. Denies history of gout.  The history is provided by the patient. No language interpreter was used.   Past Medical History:  Diagnosis Date  . Cancer of prostate ([place]) Dx 2005  . Coronary artery disease   . Diabetes mellitus without complication ([place])   . Diabetic neuropathy ([place]) Dx 2011  . Hemiparesis and alteration of sensations as late effects of stroke ([place]) 02/28/2015  . Hyperlipidemia   . Hypertension   . Peripheral arterial disease ([place])   . Stroke ([place])   . Tobacco abuse     Patient Active Problem List   Diagnosis Date Noted  . AAA (abdominal aortic aneurysm) without rupture ([place]) 10/14/2015  . Lower extremity  weakness 10/11/2015  . Acute exacerbation of chronic low back pain 10/11/2015  . Hypokalemia 10/11/2015  . Asymptomatic bacteriuria 10/11/2015  . Swelling of left elbow joint 10/11/2015  . Leg weakness, bilateral 10/11/2015  . Dehydration   . CVA (cerebral infarction): Probable recurrent subcortical CVA 09/21/2015  . Acute left-sided weakness 09/21/2015  . Hematemesis with nausea   . Secondary hypertension, unspecified   . Vomiting 09/16/2015  . GI bleed 09/16/2015  . Abnormality of gait 08/29/2015  . 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 Poor dentition 06/15/2015  . Chronic anticoagulation 06/15/2015  . Hemiparesis and alteration of sensations as late effects of stroke ([place]) 02/28/2015  . Chest pain 01/23/2015  . Left-sided weakness 10/20/2014  . Cardiomyopathy, ischemic   . Systolic CHF, chronic ([place]) 10/06/2014  . Type 2 diabetes mellitus with peripheral neuropathy ([place]) 10/04/2014  . Cerebral infarction due to embolism of right middle cerebral artery ([place]) 10/04/2014  . Stroke ([place]) 10/04/2014  . Acute embolic stroke ([place]) [id number]  . Peripheral arterial disease ([place]) 04/09/2014  . Hyperlipidemia 03/24/2014  . Diabetic peripheral neuropathy ([place]) 03/24/2014  . Hx of CABG 03/24/2014  . DM type 2 (diabetes mellitus, type 2) ([place]) 02/22/2014  . HTN (hypertension) 02/22/2014    Past Surgical History:  Procedure Laterality Date  . EP IMPLANTABLE DEVICE N/A 10/13/2014   Procedure: ICD Implant;  Surgeon: Deboraha Sprang, MD;  Location: [facility];  Service: Cardiovascular;  Laterality: N/A;  . ESOPHAGOGASTRODUODENOSCOPY (EGD) WITH PROPOFOL Left 09/19/2015   Procedure: ESOPHAGOGASTRODUODENOSCOPY (EGD) WITH PROPOFOL;  Surgeon: Wilford Corner, MD;  Location: [facility];  Service: Endoscopy;  Laterality: Left;  . REPLACEMENT TOTAL KNEE  Lt 2013/ Rt 2005  . TEE WITHOUT CARDIOVERSION N/A 10/11/2014   Procedure: TRANSESOPHAGEAL ECHOCARDIOGRAM (TEE);  Surgeon: Dorothy Spark, MD;  Location: [facility];  Service: Cardiovascular;  Laterality: N/A;     Home Medications    Prior to Admission medications   Medication Sig Start Date End Date Taking? Authorizing Provider  amitriptyline (ELAVIL) 50 MG tablet Take 1 tablet (50 mg total) by mouth at bedtime. 09/24/15  Yes Eugenie Filler, MD  aspirin EC 325 MG EC tablet Take 1 tablet (325 mg total) by mouth daily. 09/24/15  Yes Eugenie Filler, MD  atorvastatin (LIPITOR) 80 MG tablet Take 1 tablet (80 mg total) by mouth daily. 09/24/15  Yes Eugenie Filler, MD  carvedilol (COREG) 12.5 MG tablet Take 1 tablet (12.5 mg total) by mouth 2 (two) times daily. 09/24/15  Yes Eugenie Filler, MD  gabapentin (NEURONTIN) 600 MG tablet Take 1.5 tablets (900 mg total) by mouth 3 (three) times daily. Must have office visit for refills 09/24/15  Yes Eugenie Filler, MD  glipiZIDE (GLUCOTROL XL) 5 MG 24 hr tablet Take 1 tablet (5 mg total) by mouth daily with breakfast. 09/24/15  Yes Eugenie Filler, MD  hydrALAZINE (APRESOLINE) 25 MG tablet Take 1 tablet (25 mg total) by mouth 2 (two) times daily. 09/24/15  Yes Eugenie Filler, MD  insulin NPH-regular Human (NOVOLIN 70/30) (70-30) 100 UNIT/ML injection Inject 20 Units into the skin 2 (two) times daily with a meal. 09/24/15  Yes Eugenie Filler, MD  insulin regular (NOVOLIN R) 100 units/mL injection Inject 0.05 mLs (5 Units total) into the skin 3 (three) times daily before meals. 09/24/15  Yes Eugenie Filler, MD  isosorbide dinitrate (ISORDIL) 5 MG tablet Take 1 tablet (5 mg total) by mouth 2 (two) times daily. 09/24/15  Yes Eugenie Filler, MD  losartan (COZAAR) 25 MG tablet Take 1 tablet (25 mg total) by mouth daily. 09/24/15  Yes Eugenie Filler, MD  methocarbamol (ROBAXIN) 500 MG tablet Take 1 tablet (500 mg total) by mouth 2 (two) times daily. 09/14/15  Yes Tatyana Kirichenko, PA-C  oxyCODONE-acetaminophen (PERCOCET/ROXICET) 5-325 MG tablet Take 1 tablet by mouth every 8  (eight) hours as needed for moderate pain or severe pain. 09/24/15  Yes Eugenie Filler, MD  pantoprazole (PROTONIX) 40 MG tablet Take 1 tablet (40 mg total) by mouth 2 (two) times daily before a meal. Take 2 times daily x 2 months, then 1 time daily. 09/24/15  Yes Eugenie Filler, MD  polyethylene glycol [facility] / GLYCOLAX) packet Take 17 g by mouth 2 (two) times daily. 04/27/15  Yes [name], NP  naproxen (NAPROSYN) 500 MG tablet Take 1 tablet (500 mg total) by mouth 2 (two) times daily. 10/24/15   Merryl Hacker, MD  ONE TOUCH ULTRA TEST test strip TEST four times a day 06/07/14   [name], NP  oxyCODONE-acetaminophen (PERCOCET/ROXICET) 5-325 MG tablet Take 2 tablets by mouth every 4 (four) hours as needed for severe pain. 10/24/15   Merryl Hacker, MD    Family History Family History  Problem Relation Age of Onset  . Heart Problems Mother   . Heart Problems Father   . Heart attack Brother     Social History Social History  Substance Use Topics  . Smoking status: Current Some Day Smoker  Last attempt to quit: 03/26/2014  . Smokeless tobacco: Never Used  . Alcohol use No     Allergies   Lisinopril   Review of Systems Review of Systems  Constitutional: Negative for chills and fever.  Respiratory: Negative for shortness of breath.   Cardiovascular: Negative for chest pain.  Gastrointestinal: Negative for abdominal pain, diarrhea, nausea and vomiting.  Genitourinary: Negative for dysuria.  Musculoskeletal: Positive for arthralgias, back pain and myalgias.  Neurological: Positive for weakness. Negative for numbness.  All other systems reviewed and are negative.  Physical Exam Updated Vital Signs BP 193/97   Pulse 84   Temp 98.3 F (36.8 C) (Oral)   Resp 13   Ht 6' (1.829 m)   Wt 230 lb (104.3 kg)   SpO2 91%   BMI 31.19 kg/m   Physical Exam  Constitutional: He is oriented to person, place, and time.  Chronically ill-appearing  HENT:  Head:  Normocephalic and atraumatic.  Eyes: Pupils are equal, round, and reactive to light.  Cardiovascular: Normal rate, regular rhythm and normal heart sounds.   No murmur heard. Pulmonary/Chest: Effort normal and breath sounds normal. No respiratory distress.  Abdominal: Soft. Bowel sounds are normal. There is no tenderness. There is no rebound.  Musculoskeletal: He exhibits no edema.  Patient refuses to move right elbow, no significant effusion, warmth or errythema, NV intact distally  Neurological: He is alert and oriented to person, place, and time.  CN 2-12 intact, good grip strength bilaterally, + right straight leg raise, normal reflexes bilaterally  Skin: Skin is warm and dry.  Psychiatric: He has a normal mood and affect.  Nursing note and vitals reviewed.   ED Treatments / Results  DIAGNOSTIC STUDIES: Oxygen Saturation is 94% on RA, adequate by my interpretation.    COORDINATION OF CARE: 11:56 PM-Discussed treatment plan which includes X-ray of Right Elbow, CBC Diff, CMP, order of Zofran and Morphine with pt at bedside and pt agreed to plan.   Labs (all labs ordered are listed, but only abnormal results are displayed) Labs Reviewed  SEDIMENTATION RATE - Abnormal; Notable for the following:       Result Value   Sed Rate 46 (*)    All other components within normal limits  C-REACTIVE PROTEIN - Abnormal; Notable for the following:    CRP 5.7 (*)    All other components within normal limits  CBC WITH DIFFERENTIAL/PLATELET - Abnormal; Notable for the following:    RBC 4.04 (*)    Hemoglobin 11.7 (*)    HCT 35.2 (*)    All other components within normal limits  COMPREHENSIVE METABOLIC PANEL - Abnormal; Notable for the following:    Potassium 3.1 (*)    Chloride 100 (*)    Glucose, Bld 246 (*)    Calcium 8.5 (*)    Albumin 2.7 (*)    AST 11 (*)    ALT 11 (*)    All other components within normal limits  PROTIME-INR    EKG  EKG Interpretation None        Radiology Dg Elbow Complete Right  Result Date: 10/24/2015 CLINICAL DATA:  Right elbow pain for 2 days.  No known injury. EXAM: RIGHT ELBOW - COMPLETE 3+ VIEW COMPARISON:  Radiographs 09/14/2015 FINDINGS: No acute fracture, dislocation, or bony destructive change. Ulna trabecular joint space narrowing and spurring is again seen. Previous chondrocalcinosis is currently obscured due to positioning. Posterior soft tissue edema. There is a joint effusion. IMPRESSION: 1. Joint effusion.  Posterior soft tissue edema. 2. Mild degenerative change. Previous chondrocalcinosis not well visualized due to positioning. No acute bony abnormalities. Electronically Signed   By: Jeb Levering M.D.   On: 10/24/2015 00:59   Procedures Procedures (including critical care time)  Medications Ordered in ED Medications  morphine 4 MG/ML injection 4 mg (4 mg Intravenous Given 10/24/15 0017)  ondansetron (ZOFRAN) injection 4 mg (4 mg Intravenous Given 10/24/15 0017)  oxyCODONE-acetaminophen (PERCOCET/ROXICET) 5-325 MG per tablet 1 tablet (1 tablet Oral Given 10/24/15 0424)  hydrOXYzine (ATARAX/VISTARIL) tablet 25 mg (25 mg Oral Given 10/24/15 0424)  colchicine tablet 1.2 mg (1.2 mg Oral Given 10/24/15 0442)   Initial Impression / Assessment and Plan / ED Course  I have reviewed the triage vital signs and the nursing notes.  Pertinent labs & imaging results that were available during my care of the patient were reviewed by me and considered in my medical decision making (see chart for details).  Clinical Course   Patient presents with a chief complaint of right arm pain at the elbow. Also reports acute on chronic back pain. Was seen and evaluated similarly during a recent admission. At that time he had MRI brain and spine which were negative.  At that time he was having left arm pain. The etiology of his pain at that time was unknown. Gout was a consideration; however, uric acid was normal. Mild elevation in  inflammatory markers. Patient refuses to range his right elbow. He appears to have intact strength in his neurologic exam is otherwise reassuring. No signs or symptoms of cauda equina. Patient was given pain medication. No leukocytosis and patient is afebrile. Doubt septic joint.  Inflammatory markers improved from prior. Plain films do show a small joint effusion. Silva Bandy is likely inflammatory in nature and may be undiagnosed gout. Patient was given colchicine. He has normal renal function. Will place on naproxen twice a day or 5 days. He will also be given a short course of pain medication. Narcotic database was reviewed and he had a limited dose of narcotic pain medication approximately one month ago. Otherwise he has had no prior prescriptions since May. Prior to that, it appears that he had a standing 30 day supply. Patient continues to complain of bilateral lower extremity weakness; however he is able to ambulate independently without difficulty.  Will discharge home with primary care follow-up.  After history, exam, and medical workup I feel the patient has been appropriately medically screened and is safe for discharge home. Pertinent diagnoses were discussed with the patient. Patient was given return precautions.   Final Clinical Impressions(s) / ED Diagnoses   Final diagnoses:  Right arm pain  Right-sided low back pain with sciatica, sciatica laterality unspecified    New Prescriptions New Prescriptions   NAPROXEN (NAPROSYN) 500 MG TABLET    Take 1 tablet (500 mg total) by mouth 2 (two) times daily.   OXYCODONE-ACETAMINOPHEN (PERCOCET/ROXICET) 5-325 MG TABLET    Take 2 tablets by mouth every 4 (four) hours as needed for severe pain.   I personally performed the services described in this documentation, which was scribed in my presence. The recorded information has been reviewed and is accurate.     Merryl Hacker, MD 10/24/15 [phone]

## 2015-10-23 NOTE — ED Triage Notes (Signed)
Pt arrives via EMS with c/o R elbow/hand ongoing xtwo days. Per EMS report patient experienced similar symptoms with previous stroke, for which he was just discharged from hospital. Pt reports arm is tender, reports can barely move it. Rates pain 10/10, described as "steady." Pt denies mechanical cause. Given 100MCG fentanyl for pain with no relief.  Per EMS report, pt appears depressed since stroke, making statements like "I'm just trying to hold on for my children." Denies suicidal and homicidal ideation.

## 2015-10-24 ENCOUNTER — Emergency Department (HOSPITAL_COMMUNITY): Payer: Medicare HMO

## 2015-10-24 LAB — CBC WITH DIFFERENTIAL/PLATELET
BASOS PCT: 0 %
Basophils Absolute: 0 10*3/uL (ref 0.0–0.1)
EOS ABS: 0.2 10*3/uL (ref 0.0–0.7)
EOS PCT: 2 %
HCT: 35.2 % — ABNORMAL LOW (ref 39.0–52.0)
Hemoglobin: 11.7 g/dL — ABNORMAL LOW (ref 13.0–17.0)
Lymphocytes Relative: 15 %
Lymphs Abs: 1.5 10*3/uL (ref 0.7–4.0)
MCH: 29 pg (ref 26.0–34.0)
MCHC: 33.2 g/dL (ref 30.0–36.0)
MCV: 87.1 fL (ref 78.0–100.0)
MONO ABS: 0.7 10*3/uL (ref 0.1–1.0)
MONOS PCT: 7 %
Neutro Abs: 7.7 10*3/uL (ref 1.7–7.7)
Neutrophils Relative %: 76 %
Platelets: 311 10*3/uL (ref 150–400)
RBC: 4.04 MIL/uL — ABNORMAL LOW (ref 4.22–5.81)
RDW: 12.6 % (ref 11.5–15.5)
WBC: 10 10*3/uL (ref 4.0–10.5)

## 2015-10-24 LAB — COMPREHENSIVE METABOLIC PANEL
ALT: 11 U/L — AB (ref 17–63)
AST: 11 U/L — AB (ref 15–41)
Albumin: 2.7 g/dL — ABNORMAL LOW (ref 3.5–5.0)
Alkaline Phosphatase: 72 U/L (ref 38–126)
Anion gap: 8 (ref 5–15)
BUN: 9 mg/dL (ref 6–20)
CHLORIDE: 100 mmol/L — AB (ref 101–111)
CO2: 31 mmol/L (ref 22–32)
CREATININE: 0.75 mg/dL (ref 0.61–1.24)
Calcium: 8.5 mg/dL — ABNORMAL LOW (ref 8.9–10.3)
GFR calc Af Amer: >60 mL/min (ref >60)
GFR calc non Af Amer: >60 mL/min (ref >60)
GLUCOSE: 246 mg/dL — AB (ref 65–99)
Potassium: 3.1 mmol/L — ABNORMAL LOW (ref 3.5–5.1)
SODIUM: 139 mmol/L (ref 135–145)
Total Bilirubin: 0.8 mg/dL (ref 0.3–1.2)
Total Protein: 7.2 g/dL (ref 6.5–8.1)

## 2015-10-24 LAB — PROTIME-INR
INR: 1.16
PROTHROMBIN TIME: 14.8 s (ref 11.4–15.2)

## 2015-10-24 LAB — C-REACTIVE PROTEIN: CRP: 5.7 mg/dL — ABNORMAL HIGH (ref <1.0)

## 2015-10-24 LAB — SEDIMENTATION RATE: SED RATE: 46 mm/h — AB (ref 0–16)

## 2015-10-24 MED ORDER — COLCHICINE 0.6 MG PO TABS
1.2000 mg | ORAL_TABLET | Freq: Once | ORAL | Status: AC
  Administered 2015-10-24: 1.2 mg via ORAL
  Filled 2015-10-24: qty 2

## 2015-10-24 MED ORDER — ONDANSETRON HCL 4 MG/2ML IJ SOLN
4.0000 mg | Freq: Once | INTRAMUSCULAR | Status: AC
  Administered 2015-10-24: 4 mg via INTRAVENOUS
  Filled 2015-10-24: qty 2

## 2015-10-24 MED ORDER — OXYCODONE-ACETAMINOPHEN 5-325 MG PO TABS: 2.0000 | ORAL_TABLET | ORAL | 0 refills | Status: DC | PRN

## 2015-10-24 MED ORDER — NAPROXEN 500 MG PO TABS: 500.0000 mg | ORAL_TABLET | Freq: Two times a day (BID) | ORAL | 0 refills | Status: AC

## 2015-10-24 MED ORDER — HYDROXYZINE HCL 25 MG PO TABS
25.0000 mg | ORAL_TABLET | Freq: Once | ORAL | Status: AC
  Administered 2015-10-24: 25 mg via ORAL
  Filled 2015-10-24: qty 1

## 2015-10-24 MED ORDER — MORPHINE SULFATE (PF) 4 MG/ML IV SOLN
4.0000 mg | Freq: Once | INTRAVENOUS | Status: AC
  Administered 2015-10-24: 4 mg via INTRAVENOUS
  Filled 2015-10-24: qty 1

## 2015-10-24 MED ORDER — OXYCODONE-ACETAMINOPHEN 5-325 MG PO TABS
1.0000 | ORAL_TABLET | Freq: Once | ORAL | Status: AC
  Administered 2015-10-24: 1 via ORAL
  Filled 2015-10-24: qty 1

## 2015-10-24 NOTE — Discharge Instructions (Signed)
You were seen today for right elbow pain. He may have undiagnosed gout. Your workup otherwise is reassuring. He'll be given a short course of pain medication and you should take naproxen twice daily for 5 days. Limit your naproxen use to 5 days. Regarding her back pain, this is chronic in nature. Follow-up with her primary physician.

## 2015-10-24 NOTE — ED Notes (Signed)
Unable to draw labs, phlebotomy notified.

## 2015-10-24 NOTE — ED Notes (Signed)
Pt ambulated in the hall without difficulty

## 2015-10-31 ENCOUNTER — Other Ambulatory Visit: Payer: Self-pay | Admitting: Internal Medicine

## 2015-10-31 DIAGNOSIS — E1142 Type 2 diabetes mellitus with diabetic polyneuropathy: Secondary | ICD-10-CM

## 2015-11-01 ENCOUNTER — Other Ambulatory Visit: Payer: Self-pay | Admitting: Internal Medicine

## 2015-11-01 DIAGNOSIS — E1142 Type 2 diabetes mellitus with diabetic polyneuropathy: Secondary | ICD-10-CM

## 2015-11-07 ENCOUNTER — Emergency Department (HOSPITAL_COMMUNITY)
Admission: EM | Admit: 2015-11-07 | Discharge: 2015-11-08 | Disposition: A | Discharge status: Home / Self Care | Payer: Medicare HMO | Attending: Emergency Medicine | Admitting: Emergency Medicine

## 2015-11-07 ENCOUNTER — Emergency Department (HOSPITAL_COMMUNITY): Payer: Medicare HMO

## 2015-11-07 ENCOUNTER — Encounter (HOSPITAL_COMMUNITY): Payer: Self-pay | Admitting: Emergency Medicine

## 2015-11-07 DIAGNOSIS — Z7984 Long term (current) use of oral hypoglycemic drugs: Secondary | ICD-10-CM | POA: Insufficient documentation

## 2015-11-07 DIAGNOSIS — Z8673 Personal history of transient ischemic attack (TIA), and cerebral infarction without residual deficits: Secondary | ICD-10-CM | POA: Insufficient documentation

## 2015-11-07 DIAGNOSIS — F17200 Nicotine dependence, unspecified, uncomplicated: Secondary | ICD-10-CM | POA: Insufficient documentation

## 2015-11-07 DIAGNOSIS — I5022 Chronic systolic (congestive) heart failure: Secondary | ICD-10-CM | POA: Insufficient documentation

## 2015-11-07 DIAGNOSIS — R079 Chest pain, unspecified: Secondary | ICD-10-CM

## 2015-11-07 DIAGNOSIS — Z7982 Long term (current) use of aspirin: Secondary | ICD-10-CM | POA: Insufficient documentation

## 2015-11-07 DIAGNOSIS — I110 Hypertensive heart disease with heart failure: Secondary | ICD-10-CM | POA: Insufficient documentation

## 2015-11-07 DIAGNOSIS — R0789 Other chest pain: Secondary | ICD-10-CM | POA: Insufficient documentation

## 2015-11-07 DIAGNOSIS — E1140 Type 2 diabetes mellitus with diabetic neuropathy, unspecified: Secondary | ICD-10-CM | POA: Insufficient documentation

## 2015-11-07 DIAGNOSIS — Z8546 Personal history of malignant neoplasm of prostate: Secondary | ICD-10-CM | POA: Insufficient documentation

## 2015-11-07 DIAGNOSIS — Z794 Long term (current) use of insulin: Secondary | ICD-10-CM | POA: Insufficient documentation

## 2015-11-07 DIAGNOSIS — I2510 Atherosclerotic heart disease of native coronary artery without angina pectoris: Secondary | ICD-10-CM | POA: Insufficient documentation

## 2015-11-07 DIAGNOSIS — Z96659 Presence of unspecified artificial knee joint: Secondary | ICD-10-CM | POA: Insufficient documentation

## 2015-11-07 LAB — CBC
HCT: 33.2 % — ABNORMAL LOW (ref 39.0–52.0)
HEMOGLOBIN: 10.9 g/dL — AB (ref 13.0–17.0)
MCH: 28.8 pg (ref 26.0–34.0)
MCHC: 32.8 g/dL (ref 30.0–36.0)
MCV: 87.6 fL (ref 78.0–100.0)
PLATELETS: 280 10*3/uL (ref 150–400)
RBC: 3.79 MIL/uL — AB (ref 4.22–5.81)
RDW: 13.1 % (ref 11.5–15.5)
WBC: 10 10*3/uL (ref 4.0–10.5)

## 2015-11-07 LAB — BASIC METABOLIC PANEL
ANION GAP: 10 (ref 5–15)
BUN: 11 mg/dL (ref 6–20)
CHLORIDE: 95 mmol/L — AB (ref 101–111)
CO2: 30 mmol/L (ref 22–32)
CREATININE: 0.81 mg/dL (ref 0.61–1.24)
Calcium: 8.1 mg/dL — ABNORMAL LOW (ref 8.9–10.3)
GFR calc non Af Amer: >60 mL/min (ref >60)
Glucose, Bld: 200 mg/dL — ABNORMAL HIGH (ref 65–99)
POTASSIUM: 3.1 mmol/L — AB (ref 3.5–5.1)
SODIUM: 135 mmol/L (ref 135–145)

## 2015-11-07 LAB — D-DIMER, QUANTITATIVE (NOT AT ARMC): D DIMER QUANT: 1.88 ug{FEU}/mL — AB (ref 0.00–0.50)

## 2015-11-07 LAB — I-STAT TROPONIN, ED: Troponin i, poc: 0.02 ng/mL (ref 0.00–0.08)

## 2015-11-07 MED ORDER — HYDROMORPHONE HCL 1 MG/ML IJ SOLN
1.0000 mg | Freq: Once | INTRAMUSCULAR | Status: AC
  Administered 2015-11-07: 1 mg via INTRAVENOUS
  Filled 2015-11-07: qty 1

## 2015-11-07 NOTE — ED Notes (Signed)
MD at bedside. 

## 2015-11-07 NOTE — ED Triage Notes (Signed)
Pt in reports L sided CP with radiation down L arm. Also reports back pain X3 days. Pt was dx with several bulging discs. Back pain is main complaint. VSS, A/OX4

## 2015-11-07 NOTE — ED Provider Notes (Signed)
Complains of left-sided thoracic back pain rating to left lateral chest onset several days ago. Progressively worsening worse with changing positions or with deep inspiration. He states she's been sitting in a loveseat for the past 2 days due to severe pain. On exam alert no respiratory  distress lungs clear auscultation heart regular rate and rhythm chest is exquisitely tender left side. He has exquisite pain when he sits up from a supine position Extremities without edema peripheral pulses intact DP pulses 2+ bilaterally radial pulse 2+ bilaterally   Orlie Dakin, MD 11/08/15 0020

## 2015-11-07 NOTE — ED Provider Notes (Signed)
[facility] DEPT Provider Note   CSN: [id number] Arrival date & time: 11/07/15  2013     History   Chief Complaint Chief Complaint  Patient presents with  . Back Pain  . Chest Pain    HPI Thomas Sosa is a 60 y.o. male.  HPI  History of chronic back pain, poorly controlled IDDM, CAD post CABG (remote), HFrEF, prior prostate cancer presents for back pain and chest pain.  Back pain is mostly low back, center and sides, similar to chronic pain, but worse than ever before.  He denies recent falls or other trauma.  It is worse over 3 days  Chest pain is left sided, sharp, worse with deep inspiration, associated with SOB with deep breaths.  Recent admission (7/18-8/1) for lower extremity weakness, acute on chronic back pain, transient slurred speech.  During that admission, he complained of worsening LLE weakness and new RLE weakness with difficult ambulation.  He had a CT lumbar spine with CT lumbar spine with significant degenerative changes, mild canal stenosis, and multi-level neural foraminal narrowing; no acute fx, malalignment, or metastasis seen.   MR lumbar spine showed no acute changes on 7/20.  Past Medical History:  Diagnosis Date  . Cancer of prostate ([place]) Dx 2005  . Coronary artery disease   . Diabetes mellitus without complication ([place])   . Diabetic neuropathy ([place]) Dx 2011  . Hemiparesis and alteration of sensations as late effects of stroke ([place]) 02/28/2015  . Hyperlipidemia   . Hypertension   . Peripheral arterial disease ([place])   . Stroke ([place])   . Tobacco abuse     Patient Active Problem List   Diagnosis Date Noted  . AAA (abdominal aortic aneurysm) without rupture ([place]) 10/14/2015  . Lower extremity weakness 10/11/2015  . Acute exacerbation of chronic low back pain 10/11/2015  . Hypokalemia 10/11/2015  . Asymptomatic bacteriuria 10/11/2015  . Swelling of left elbow joint 10/11/2015  . Leg weakness, bilateral 10/11/2015  . Dehydration   . CVA  (cerebral infarction): Probable recurrent subcortical CVA 09/21/2015  . Acute left-sided weakness 09/21/2015  . Hematemesis with nausea   . Secondary hypertension, unspecified   . Vomiting 09/16/2015  . GI bleed 09/16/2015  . Abnormality of gait 08/29/2015  . 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 Poor dentition 06/15/2015  . Chronic anticoagulation 06/15/2015  . Hemiparesis and alteration of sensations as late effects of stroke ([place]) 02/28/2015  . Chest pain 01/23/2015  . Left-sided weakness 10/20/2014  . Cardiomyopathy, ischemic   . Systolic CHF, chronic ([place]) 10/06/2014  . Type 2 diabetes mellitus with peripheral neuropathy ([place]) 10/04/2014  . Cerebral infarction due to embolism of right middle cerebral artery ([place]) 10/04/2014  . Stroke ([place]) 10/04/2014  . Acute embolic stroke ([place]) [id number]  . Peripheral arterial disease ([place]) 04/09/2014  . Hyperlipidemia 03/24/2014  . Diabetic peripheral neuropathy ([place]) 03/24/2014  . Hx of CABG 03/24/2014  . DM type 2 (diabetes mellitus, type 2) ([place]) 02/22/2014  . HTN (hypertension) 02/22/2014    Past Surgical History:  Procedure Laterality Date  . EP IMPLANTABLE DEVICE N/A 10/13/2014   Procedure: ICD Implant;  Surgeon: Deboraha Sprang, MD;  Location: [facility] LAB;  Service: Cardiovascular;  Laterality: N/A;  . ESOPHAGOGASTRODUODENOSCOPY (EGD) WITH PROPOFOL Left 09/19/2015   Procedure: ESOPHAGOGASTRODUODENOSCOPY (EGD) WITH PROPOFOL;  Surgeon: Wilford Corner, MD;  Location: [facility] ENDOSCOPY;  Service: Endoscopy;  Laterality: Left;  . REPLACEMENT TOTAL KNEE  Lt 2013/ Rt 2005  . TEE WITHOUT CARDIOVERSION  N/A 10/11/2014   Procedure: TRANSESOPHAGEAL ECHOCARDIOGRAM (TEE);  Surgeon: Dorothy Spark, MD;  Location: [facility] ENDOSCOPY;  Service: Cardiovascular;  Laterality: N/A;       Home Medications    Prior to Admission medications   Medication Sig Start Date End Date Taking? Authorizing Provider  amitriptyline (ELAVIL) 50 MG tablet  Take 1 tablet (50 mg total) by mouth at bedtime. 09/24/15   Eugenie Filler, MD  aspirin EC 325 MG EC tablet Take 1 tablet (325 mg total) by mouth daily. 09/24/15   Eugenie Filler, MD  atorvastatin (LIPITOR) 80 MG tablet Take 1 tablet (80 mg total) by mouth daily. 09/24/15   Eugenie Filler, MD  carvedilol (COREG) 12.5 MG tablet Take 1 tablet (12.5 mg total) by mouth 2 (two) times daily. 09/24/15   Eugenie Filler, MD  gabapentin (NEURONTIN) 600 MG tablet Take 1.5 tablets (900 mg total) by mouth 3 (three) times daily. Must have office visit for refills 09/24/15   Eugenie Filler, MD  gabapentin (NEURONTIN) 600 MG tablet TAKE ONE & ONE-HALF TABLETS BY MOUTH THREE TIMES DAILY 11/01/15   Tresa Garter, MD  glipiZIDE (GLUCOTROL XL) 5 MG 24 hr tablet Take 1 tablet (5 mg total) by mouth daily with breakfast. 09/24/15   Eugenie Filler, MD  hydrALAZINE (APRESOLINE) 25 MG tablet Take 1 tablet (25 mg total) by mouth 2 (two) times daily. 09/24/15   Eugenie Filler, MD  insulin NPH-regular Human (NOVOLIN 70/30) (70-30) 100 UNIT/ML injection Inject 20 Units into the skin 2 (two) times daily with a meal. 09/24/15   Eugenie Filler, MD  insulin regular (NOVOLIN R) 100 units/mL injection Inject 0.05 mLs (5 Units total) into the skin 3 (three) times daily before meals. 09/24/15   Eugenie Filler, MD  isosorbide dinitrate (ISORDIL) 5 MG tablet Take 1 tablet (5 mg total) by mouth 2 (two) times daily. 09/24/15   Eugenie Filler, MD  losartan (COZAAR) 25 MG tablet Take 1 tablet (25 mg total) by mouth daily. 09/24/15   Eugenie Filler, MD  methocarbamol (ROBAXIN) 500 MG tablet Take 1 tablet (500 mg total) by mouth 2 (two) times daily. 09/14/15   Tatyana Kirichenko, PA-C  naproxen (NAPROSYN) 500 MG tablet Take 1 tablet (500 mg total) by mouth 2 (two) times daily. 10/24/15   Merryl Hacker, MD  ONE TOUCH ULTRA TEST test strip TEST four times a day 06/07/14   [name], NP  oxyCODONE-acetaminophen  (PERCOCET/ROXICET) 5-325 MG tablet Take 1 tablet by mouth every 8 (eight) hours as needed for moderate pain or severe pain. 09/24/15   Eugenie Filler, MD  oxyCODONE-acetaminophen (PERCOCET/ROXICET) 5-325 MG tablet Take 2 tablets by mouth every 4 (four) hours as needed for severe pain. 10/24/15   Merryl Hacker, MD  pantoprazole (PROTONIX) 40 MG tablet Take 1 tablet (40 mg total) by mouth 2 (two) times daily before a meal. Take 2 times daily x 2 months, then 1 time daily. 09/24/15   Eugenie Filler, MD  polyethylene glycol [facility] / Floria Raveling) packet Take 17 g by mouth 2 (two) times daily. 04/27/15   [name], NP    Family History Family History  Problem Relation Age of Onset  . Heart Problems Mother   . Heart Problems Father   . Heart attack Brother     Social History Social History  Substance Use Topics  . Smoking status: Current Some Day Smoker    Last  attempt to quit: 03/26/2014  . Smokeless tobacco: Never Used  . Alcohol use No     Allergies   Lisinopril   Review of Systems Review of Systems  Constitutional: Negative for chills and fever.  HENT: Negative for ear pain and sore throat.   Eyes: Negative for pain and visual disturbance.  Respiratory: Negative for cough and shortness of breath.   Cardiovascular: Positive for chest pain. Negative for palpitations.  Gastrointestinal: Negative for abdominal pain and vomiting.  Genitourinary: Negative for dysuria and hematuria.  Musculoskeletal: Positive for back pain. Negative for arthralgias.  Skin: Negative for color change and rash.  Neurological: Negative for seizures and syncope.  All other systems reviewed and are negative.    Physical Exam Updated Vital Signs BP 174/85 (BP Location: Right Arm)   Pulse 93   Temp 98.4 F (36.9 C) (Oral)   Resp 23   Ht 6' (1.829 m)   Wt 104.3 kg   SpO2 97%   BMI 31.19 kg/m   Physical Exam  Constitutional: He appears well-developed and well-nourished.  HENT:  Head:  Normocephalic and atraumatic.  Eyes: Conjunctivae are normal.  Neck: Neck supple.  Cardiovascular: Normal rate and regular rhythm.   No murmur heard. Pulmonary/Chest: Effort normal and breath sounds normal. No respiratory distress. He exhibits tenderness.  Abdominal: Soft. There is no tenderness.  Genitourinary:  Genitourinary Comments: Intact rectal tone.  Musculoskeletal: He exhibits no edema.  Back TTP in lumbar region, no deformities.  Neurological: He is alert.  Alert and oriented CN II-XII grossly intact Eyes: PERRL, EOMI Bilateral UE strength 5/5 Bilateral LE strength 5/5   Skin: Skin is warm and dry.  Psychiatric: He has a normal mood and affect.  Nursing note and vitals reviewed.    ED Treatments / Results  Labs (all labs ordered are listed, but only abnormal results are displayed) [place], ED    EKG  EKG Interpretation None       Radiology No results found.  Procedures Procedures (including critical care time)  Medications Ordered in ED Medications - No data to display   Initial Impression / Assessment and Plan / ED Course  I have reviewed the triage vital signs and the nursing notes.  Pertinent labs & imaging results that were available during my care of the patient were reviewed by me and considered in my medical decision making (see chart for details).  Clinical Course    Patient has had recent extensive workup for back pain, including CT and Mr, as above.  No findings on exam today that would be concerning for spinal cord compression.  Suspect acute on chronic pain.  Will encourage PCP followup.  Given his difficulty with mobility, [facility] was consulted to assist with appropriate DME at home.  Difficulty with ambulating since last discharge.  Given chest pain, doubt ACS in this gentleman.  Trop negative.  CXR unremarkable.  Doubt PNA, dissection, ACS.  Chest pain is reproducible. Dimer elevated,  but PE scan pending at time of signout.  Signed out to Dr. Stark Jock.  Final Clinical Impressions(s) / ED Diagnoses   Final diagnoses:  None    New Prescriptions New Prescriptions   No medications on file     Levada Schilling, MD 11/10/15 [place], MD 11/10/15 [id number]

## 2015-11-07 NOTE — Care Management Note (Addendum)
Case Management Note  Patient Details  Name: Thomas Sosa MRN: [id number] Date of Birth: July 08, 1955  Subjective/Objective:                  left-sided thoracic back pain rating to left lateral chest  Action/Plan: Cm spoke to patient at the bedside who said that he lives at home with his fiance who is a Therapist, sports. Pt also said that he has a daughter who is a Therapist, sports. He has been having increased difficulty with mobility due to the pain and felt that he would benefit from [facility] PT/OT but declined [facility] RN or Aide.  CM offered patient choice and he chose [facility] for DME and [place] needs. He requested a cane and 3N1 to assist with safe mobility. Cm faxed request for [facility] services to [facility] care and confirmation received. Cane and 3N1 delivered to patient bedside.  Cm confirmed patient home address and contact information. Patient denied further needs for discharge at this time and CM will continue to follow should additional [place] needs arise.   Expected Discharge Date:  11/07/15              Expected Discharge Plan:  [place]  In-House Referral:     Discharge planning Services  CM Consult  Post Acute Care Choice:  Durable Medical Equipment, Home Health Choice offered to:  Patient  DME Arranged:  3-N-1, Kasandra Knudsen DME Agency:  [facility] Arranged:  PT, OT [facility] Agency:  [facility]  Status of Service:  In progress If discussed at Long Length of Stay Meetings, dates discussed:    Additional Comments:  Guido Sander, RN 11/07/2015, 10:06 PM

## 2015-11-08 ENCOUNTER — Telehealth: Payer: Self-pay | Admitting: *Deleted

## 2015-11-08 ENCOUNTER — Emergency Department (HOSPITAL_COMMUNITY): Payer: Medicare HMO

## 2015-11-08 MED ORDER — IOPAMIDOL (ISOVUE-370) INJECTION 76%
INTRAVENOUS | Status: DC
Start: 2015-11-08 — End: 2015-11-08
  Filled 2015-11-08: qty 100

## 2015-11-08 MED ORDER — IOPAMIDOL (ISOVUE-370) INJECTION 76%
INTRAVENOUS | Status: AC
  Administered 2015-11-08: 100 mL
  Filled 2015-11-08: qty 100

## 2015-11-08 NOTE — Discharge Instructions (Signed)
Ibuprofen 600 g every 6 hours as needed for pain.

## 2015-11-08 NOTE — ED Notes (Signed)
Taxi voucher provided to pt, pt ambulated to door with this RN and [facility] EMT. Pt used cane. Pt ambulated with no difficulty, this RN feels it is safe for pt to be DC. Instructed family via telephone the plan.

## 2015-11-08 NOTE — ED Provider Notes (Addendum)
Care assumed from Dr. Winfred Leeds at shift change. Patient awaiting a CT angioma of the chest to rule out pulmonary embolism. This was performed and was negative. Patient has remained stable after sign out and appears appropriate for discharge with emergent pathology excluded. To return as needed for any problems.   Veryl Speak, MD 11/08/15 CD:[id number]    Veryl Speak, MD 11/08/15 CD:[id number]

## 2015-11-09 ENCOUNTER — Telehealth: Payer: Self-pay | Admitting: *Deleted

## 2015-11-14 ENCOUNTER — Telehealth: Payer: Self-pay | Admitting: Internal Medicine

## 2015-11-14 NOTE — Telephone Encounter (Signed)
Thomas Sosa from [facility] called requesting verbal orders for physical therapy. She put like the orders for once a week for one week and twice a week for eight weeks. She also stated that it is important for the pt to have the physical therapy done at home b/c he has no transportation. She also stated that it is ok to LVM. Please f/u

## 2015-11-14 NOTE — Telephone Encounter (Signed)
Medical Assistant left message on patient's home and cell voicemail. Voicemail states to give a call back to [place] with [facility] at [phone].  MA provided VO for patients home PT. No further questions at this time.

## 2015-11-15 ENCOUNTER — Emergency Department (HOSPITAL_COMMUNITY)
Admission: EM | Admit: 2015-11-15 | Discharge: 2015-11-15 | Disposition: A | Discharge status: Home / Self Care | Payer: Medicare HMO | Attending: Emergency Medicine | Admitting: Emergency Medicine

## 2015-11-15 ENCOUNTER — Encounter (HOSPITAL_COMMUNITY): Payer: Self-pay

## 2015-11-15 DIAGNOSIS — Z8546 Personal history of malignant neoplasm of prostate: Secondary | ICD-10-CM | POA: Insufficient documentation

## 2015-11-15 DIAGNOSIS — Z7984 Long term (current) use of oral hypoglycemic drugs: Secondary | ICD-10-CM | POA: Insufficient documentation

## 2015-11-15 DIAGNOSIS — B029 Zoster without complications: Secondary | ICD-10-CM | POA: Diagnosis not present

## 2015-11-15 DIAGNOSIS — E1140 Type 2 diabetes mellitus with diabetic neuropathy, unspecified: Secondary | ICD-10-CM | POA: Diagnosis not present

## 2015-11-15 DIAGNOSIS — Z794 Long term (current) use of insulin: Secondary | ICD-10-CM | POA: Diagnosis not present

## 2015-11-15 DIAGNOSIS — Z7901 Long term (current) use of anticoagulants: Secondary | ICD-10-CM | POA: Insufficient documentation

## 2015-11-15 DIAGNOSIS — R21 Rash and other nonspecific skin eruption: Secondary | ICD-10-CM | POA: Diagnosis present

## 2015-11-15 DIAGNOSIS — Z8673 Personal history of transient ischemic attack (TIA), and cerebral infarction without residual deficits: Secondary | ICD-10-CM | POA: Insufficient documentation

## 2015-11-15 DIAGNOSIS — I2510 Atherosclerotic heart disease of native coronary artery without angina pectoris: Secondary | ICD-10-CM | POA: Diagnosis not present

## 2015-11-15 DIAGNOSIS — I5022 Chronic systolic (congestive) heart failure: Secondary | ICD-10-CM | POA: Diagnosis not present

## 2015-11-15 DIAGNOSIS — F17200 Nicotine dependence, unspecified, uncomplicated: Secondary | ICD-10-CM | POA: Insufficient documentation

## 2015-11-15 DIAGNOSIS — Z96653 Presence of artificial knee joint, bilateral: Secondary | ICD-10-CM | POA: Insufficient documentation

## 2015-11-15 DIAGNOSIS — I110 Hypertensive heart disease with heart failure: Secondary | ICD-10-CM | POA: Diagnosis not present

## 2015-11-15 DIAGNOSIS — Z7982 Long term (current) use of aspirin: Secondary | ICD-10-CM | POA: Diagnosis not present

## 2015-11-15 DIAGNOSIS — E876 Hypokalemia: Secondary | ICD-10-CM | POA: Diagnosis not present

## 2015-11-15 LAB — CBC WITH DIFFERENTIAL/PLATELET
BASOS ABS: 0 10*3/uL (ref 0.0–0.1)
BASOS PCT: 1 %
EOS PCT: 3 %
Eosinophils Absolute: 0.2 10*3/uL (ref 0.0–0.7)
HEMATOCRIT: 34.5 % — AB (ref 39.0–52.0)
Hemoglobin: 11.3 g/dL — ABNORMAL LOW (ref 13.0–17.0)
LYMPHS PCT: 40 %
Lymphs Abs: 1.7 10*3/uL (ref 0.7–4.0)
MCH: 28.5 pg (ref 26.0–34.0)
MCHC: 32.8 g/dL (ref 30.0–36.0)
MCV: 86.9 fL (ref 78.0–100.0)
Monocytes Absolute: 0.5 10*3/uL (ref 0.1–1.0)
Monocytes Relative: 11 %
NEUTROS ABS: 2 10*3/uL (ref 1.7–7.7)
Neutrophils Relative %: 45 %
PLATELETS: 321 10*3/uL (ref 150–400)
RBC: 3.97 MIL/uL — AB (ref 4.22–5.81)
RDW: 13.3 % (ref 11.5–15.5)
WBC: 4.4 10*3/uL (ref 4.0–10.5)

## 2015-11-15 LAB — COMPREHENSIVE METABOLIC PANEL
ALBUMIN: 2.6 g/dL — AB (ref 3.5–5.0)
ALT: 10 U/L — AB (ref 17–63)
AST: 13 U/L — AB (ref 15–41)
Alkaline Phosphatase: 64 U/L (ref 38–126)
Anion gap: 10 (ref 5–15)
BUN: 11 mg/dL (ref 6–20)
CHLORIDE: 92 mmol/L — AB (ref 101–111)
CO2: 31 mmol/L (ref 22–32)
CREATININE: 0.82 mg/dL (ref 0.61–1.24)
Calcium: 8.8 mg/dL — ABNORMAL LOW (ref 8.9–10.3)
GFR calc Af Amer: >60 mL/min (ref >60)
GLUCOSE: 260 mg/dL — AB (ref 65–99)
Potassium: 2.8 mmol/L — ABNORMAL LOW (ref 3.5–5.1)
Sodium: 133 mmol/L — ABNORMAL LOW (ref 135–145)
Total Bilirubin: 0.6 mg/dL (ref 0.3–1.2)
Total Protein: 7 g/dL (ref 6.5–8.1)

## 2015-11-15 MED ORDER — PREDNISONE 20 MG PO TABS
60.0000 mg | ORAL_TABLET | Freq: Once | ORAL | Status: AC
  Administered 2015-11-15: 60 mg via ORAL
  Filled 2015-11-15: qty 3

## 2015-11-15 MED ORDER — OXYCODONE-ACETAMINOPHEN 5-325 MG PO TABS: 2.0000 | ORAL_TABLET | ORAL | 0 refills | Status: DC | PRN

## 2015-11-15 MED ORDER — MORPHINE SULFATE (PF) 4 MG/ML IV SOLN
4.0000 mg | Freq: Once | INTRAVENOUS | Status: AC
  Administered 2015-11-15: 4 mg via INTRAVENOUS
  Filled 2015-11-15: qty 1

## 2015-11-15 MED ORDER — ACYCLOVIR 400 MG PO TABS: 400.0000 mg | ORAL_TABLET | Freq: Every day | ORAL | 0 refills | Status: AC

## 2015-11-15 MED ORDER — PREDNISONE 20 MG PO TABS: ORAL_TABLET | ORAL | 0 refills | Status: AC

## 2015-11-15 MED ORDER — GABAPENTIN 600 MG PO TABS: 600.0000 mg | ORAL_TABLET | Freq: Three times a day (TID) | ORAL | 0 refills | Status: DC

## 2015-11-15 MED ORDER — POTASSIUM CHLORIDE 10 MEQ/100ML IV SOLN
10.0000 meq | Freq: Once | INTRAVENOUS | Status: AC
  Administered 2015-11-15: 10 meq via INTRAVENOUS
  Filled 2015-11-15: qty 100

## 2015-11-15 MED ORDER — POTASSIUM CHLORIDE CRYS ER 20 MEQ PO TBCR
40.0000 meq | EXTENDED_RELEASE_TABLET | Freq: Once | ORAL | Status: AC
  Administered 2015-11-15: 40 meq via ORAL
  Filled 2015-11-15: qty 2

## 2015-11-15 MED ORDER — SODIUM CHLORIDE 0.9 % IV BOLUS (SEPSIS)
1000.0000 mL | Freq: Once | INTRAVENOUS | Status: AC
  Administered 2015-11-15: 1000 mL via INTRAVENOUS

## 2015-11-15 NOTE — Discharge Instructions (Signed)
Take acyclovir 5 times daily for 10 days.   Take prednisone as prescribed.   Take percocet for pain. DO NOT drive with it.   Take gabapentin for pain.   The rash may get worse before it gets better   See your doctor for follow up  Return to ER if you have severe pain, fever, purulent discharge from rash

## 2015-11-15 NOTE — ED Triage Notes (Signed)
Pt BIB [facility] for evaluation of generalized weakness and pain over the last few weeks. Pt. Recently seen and d/c/ from hospital for same. Pt. Seen by home health nurse today, nurse noticed shingle like rash to L chest and upper back.

## 2015-11-15 NOTE — Discharge Planning (Signed)
[facility] consult for assistance with medications, however per hospital adm records this pt has commercial insurance with medication benefits, therefore we are unable to assist with medications.

## 2015-11-15 NOTE — ED Provider Notes (Signed)
[facility] DEPT Provider Note   CSN: [id number] Arrival date & time: 11/15/15  1438     History   Chief Complaint Chief Complaint  Patient presents with  . Weakness    HPI Thomas Sosa is a 60 y.o. male hx of DM, CAD, diabetic neuropathy, HL, HTN here with weakness, rash. Patient recently admitted for leg weakness in the month of July and possible TIA. He was seen in the ED a week ago for weakness and had extensive workup including normal labs, nl CT angio chest. He was thought to have chronic pain at that time. He states that after discharge, he noticed a rash on left chest. He noticed vesicles erupted about 3 days ago and now there are vesicles on the left chest and back. He doesn't remember if he had chicken pox as a child. Denies fevers. Continues to have back pain that is chronic. He is out of his medicines due to lack of money and in particular, out of neurontin.   The history is provided by the patient.    Past Medical History:  Diagnosis Date  . Cancer of prostate ([place]) Dx 2005  . Coronary artery disease   . Diabetes mellitus without complication ([place])   . Diabetic neuropathy ([place]) Dx 2011  . Hemiparesis and alteration of sensations as late effects of stroke ([place]) 02/28/2015  . Hyperlipidemia   . Hypertension   . Peripheral arterial disease ([place])   . Stroke ([place])   . Tobacco abuse     Patient Active Problem List   Diagnosis Date Noted  . AAA (abdominal aortic aneurysm) without rupture ([place]) 10/14/2015  . Lower extremity weakness 10/11/2015  . Acute exacerbation of chronic low back pain 10/11/2015  . Hypokalemia 10/11/2015  . Asymptomatic bacteriuria 10/11/2015  . Swelling of left elbow joint 10/11/2015  . Leg weakness, bilateral 10/11/2015  . Dehydration   . CVA (cerebral infarction): Probable recurrent subcortical CVA 09/21/2015  . Acute left-sided weakness 09/21/2015  . Hematemesis with nausea   . Secondary hypertension, unspecified   . Vomiting  09/16/2015  . GI bleed 09/16/2015  . Abnormality of gait 08/29/2015  . 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 Poor dentition 06/15/2015  . Chronic anticoagulation 06/15/2015  . Hemiparesis and alteration of sensations as late effects of stroke ([place]) 02/28/2015  . Chest pain 01/23/2015  . Left-sided weakness 10/20/2014  . Cardiomyopathy, ischemic   . Systolic CHF, chronic ([place]) 10/06/2014  . Type 2 diabetes mellitus with peripheral neuropathy ([place]) 10/04/2014  . Cerebral infarction due to embolism of right middle cerebral artery ([place]) 10/04/2014  . Stroke ([place]) 10/04/2014  . Acute embolic stroke ([place]) [id number]  . Peripheral arterial disease ([place]) 04/09/2014  . Hyperlipidemia 03/24/2014  . Diabetic peripheral neuropathy ([place]) 03/24/2014  . Hx of CABG 03/24/2014  . DM type 2 (diabetes mellitus, type 2) ([place]) 02/22/2014  . HTN (hypertension) 02/22/2014    Past Surgical History:  Procedure Laterality Date  . EP IMPLANTABLE DEVICE N/A 10/13/2014   Procedure: ICD Implant;  Surgeon: Deboraha Sprang, MD;  Location: [facility];  Service: Cardiovascular;  Laterality: N/A;  . ESOPHAGOGASTRODUODENOSCOPY (EGD) WITH PROPOFOL Left 09/19/2015   Procedure: ESOPHAGOGASTRODUODENOSCOPY (EGD) WITH PROPOFOL;  Surgeon: Wilford Corner, MD;  Location: [facility];  Service: Endoscopy;  Laterality: Left;  . REPLACEMENT TOTAL KNEE  Lt 2013/ Rt 2005  . TEE WITHOUT CARDIOVERSION N/A 10/11/2014   Procedure: TRANSESOPHAGEAL ECHOCARDIOGRAM (TEE);  Surgeon: Dorothy Spark, MD;  Location: [facility];  Service: Cardiovascular;  Laterality: N/A;       Home Medications    Prior to Admission medications   Medication Sig Start Date End Date Taking? Authorizing Provider  amitriptyline (ELAVIL) 50 MG tablet Take 1 tablet (50 mg total) by mouth at bedtime. 09/24/15   Eugenie Filler, MD  aspirin EC 325 MG EC tablet Take 1 tablet (325 mg total) by mouth daily. 09/24/15   Eugenie Filler, MD    atorvastatin (LIPITOR) 80 MG tablet Take 1 tablet (80 mg total) by mouth daily. 09/24/15   Eugenie Filler, MD  carvedilol (COREG) 12.5 MG tablet Take 1 tablet (12.5 mg total) by mouth 2 (two) times daily. 09/24/15   Eugenie Filler, MD  gabapentin (NEURONTIN) 600 MG tablet Take 1.5 tablets (900 mg total) by mouth 3 (three) times daily. Must have office visit for refills Patient taking differently: Take 900 mg by mouth 3 (three) times daily.  09/24/15   Eugenie Filler, MD  gabapentin (NEURONTIN) 600 MG tablet TAKE ONE & ONE-HALF TABLETS BY MOUTH THREE TIMES DAILY Patient not taking: Reported on 11/08/2015 11/01/15   Tresa Garter, MD  glipiZIDE (GLUCOTROL XL) 5 MG 24 hr tablet Take 1 tablet (5 mg total) by mouth daily with breakfast. 09/24/15   Eugenie Filler, MD  hydrALAZINE (APRESOLINE) 25 MG tablet Take 1 tablet (25 mg total) by mouth 2 (two) times daily. 09/24/15   Eugenie Filler, MD  insulin NPH-regular Human (NOVOLIN 70/30) (70-30) 100 UNIT/ML injection Inject 20 Units into the skin 2 (two) times daily with a meal. 09/24/15   Eugenie Filler, MD  insulin regular (NOVOLIN R) 100 units/mL injection Inject 0.05 mLs (5 Units total) into the skin 3 (three) times daily before meals. 09/24/15   Eugenie Filler, MD  isosorbide dinitrate (ISORDIL) 5 MG tablet Take 1 tablet (5 mg total) by mouth 2 (two) times daily. 09/24/15   Eugenie Filler, MD  losartan (COZAAR) 25 MG tablet Take 1 tablet (25 mg total) by mouth daily. 09/24/15   Eugenie Filler, MD  methocarbamol (ROBAXIN) 500 MG tablet Take 1 tablet (500 mg total) by mouth 2 (two) times daily. 09/14/15   Tatyana Kirichenko, PA-C  naproxen (NAPROSYN) 500 MG tablet Take 1 tablet (500 mg total) by mouth 2 (two) times daily. 10/24/15   Merryl Hacker, MD  ONE TOUCH ULTRA TEST test strip TEST four times a day 06/07/14   [name], NP  oxyCODONE-acetaminophen (PERCOCET/ROXICET) 5-325 MG tablet Take 1 tablet by mouth every 8 (eight) hours as  needed for moderate pain or severe pain. Patient not taking: Reported on 11/08/2015 09/24/15   Eugenie Filler, MD  oxyCODONE-acetaminophen (PERCOCET/ROXICET) 5-325 MG tablet Take 2 tablets by mouth every 4 (four) hours as needed for severe pain. 10/24/15   Merryl Hacker, MD  pantoprazole (PROTONIX) 40 MG tablet Take 1 tablet (40 mg total) by mouth 2 (two) times daily before a meal. Take 2 times daily x 2 months, then 1 time daily. 09/24/15   Eugenie Filler, MD  polyethylene glycol [facility] / Floria Raveling) packet Take 17 g by mouth 2 (two) times daily. 04/27/15   [name], NP    Family History Family History  Problem Relation Age of Onset  . Heart Problems Mother   . Heart Problems Father   . Heart attack Brother     Social History Social History  Substance Use Topics  . Smoking status: Current  Some Day Smoker    Last attempt to quit: 03/26/2014  . Smokeless tobacco: Never Used  . Alcohol use No     Allergies   Lisinopril   Review of Systems Review of Systems  Skin: Positive for rash.  Neurological: Positive for weakness.  All other systems reviewed and are negative.    Physical Exam Updated Vital Signs BP 163/96   Pulse 86   Temp 98.4 F (36.9 C) (Oral)   Resp 21   Ht 6' (1.829 m)   Wt 230 lb (104.3 kg)   SpO2 96%   BMI 31.19 kg/m   Physical Exam  Constitutional: He is oriented to person, place, and time.  Chronically ill, uncomfortable   HENT:  Head: Normocephalic.  Eyes: EOM are normal. Pupils are equal, round, and reactive to light.  Neck: Normal range of motion. Neck supple.  Cardiovascular: Normal rate, regular rhythm and normal heart sounds.   Pulmonary/Chest: Effort normal and breath sounds normal.  Abdominal: Soft. Bowel sounds are normal.  Musculoskeletal: Normal range of motion.  Neurological: He is alert and oriented to person, place, and time.  Skin:  Vesicles with various stages of healing on the left chest, back, neck, not involving face  or right side   Psychiatric: He has a normal mood and affect.  Nursing note and vitals reviewed.    ED Treatments / Results  Labs (all labs ordered are listed, but only abnormal results are displayed) Labs Reviewed  CBC WITH DIFFERENTIAL/PLATELET - Abnormal; Notable for the following:       Result Value   RBC 3.97 (*)    Hemoglobin 11.3 (*)    HCT 34.5 (*)    All other components within normal limits  COMPREHENSIVE METABOLIC PANEL - Abnormal; Notable for the following:    Sodium 133 (*)    Potassium 2.8 (*)    Chloride 92 (*)    Glucose, Bld 260 (*)    Calcium 8.8 (*)    Albumin 2.6 (*)    AST 13 (*)    ALT 10 (*)    All other components within normal limits    EKG  EKG Interpretation  Date/Time:  Tuesday November 15 2015 14:49:34 EDT Ventricular Rate:  93 PR Interval:    QRS Duration: 123 QT Interval:  388 QTC Calculation: 483 R Axis:   10 Text Interpretation:  Sinus rhythm Atrial premature complex Probable left atrial enlargement LVH with secondary repolarization abnormality Anterior Q waves, possibly due to LVH No significant change since last tracing Confirmed by YAO  MD, DAVID ([id number]) on 11/15/2015 3:09:20 PM       Radiology No results found.  Procedures Procedures (including critical care time)  Medications Ordered in ED Medications  predniSONE (DELTASONE) tablet 60 mg (60 mg Oral Given 11/15/15 1519)  morphine 4 MG/ML injection 4 mg (4 mg Intravenous Given 11/15/15 1519)  sodium chloride 0.9 % bolus 1,000 mL (0 mLs Intravenous Stopped 11/15/15 1704)  potassium chloride SA (K-DUR,KLOR-CON) CR tablet 40 mEq (40 mEq Oral Given 11/15/15 1713)  potassium chloride 10 mEq in 100 mL IVPB (0 mEq Intravenous Stopped 11/15/15 1820)     Initial Impression / Assessment and Plan / ED Course  I have reviewed the triage vital signs and the nursing notes.  Pertinent labs & imaging results that were available during my care of the patient were reviewed by me and considered  in my medical decision making (see chart for details).  Clinical Course   Previn  Persky is a 60 y.o. male here with rash. Likely shingles. Vitals stable. States that he is out of his meds. Will get basic labs, give pain meds. His rash is there about 3 days so not sure if steroids or acyclovir will work but will give empirically. Will consult case management regarding getting medications.   7:06 PM Labs showed K 2.8, supplemented. No vomiting and able to keep food down, just has poor appetite. Case management saw patient and he has insurance so doesn't qualify for medication assistance. Will dc home with prednisone, acyclovir, percocet and gabapentin for pain    Final Clinical Impressions(s) / ED Diagnoses   Final diagnoses:  None    New Prescriptions New Prescriptions   No medications on file     Drenda Freeze, MD 11/15/15 Einar Crow

## 2015-11-17 ENCOUNTER — Telehealth: Payer: Self-pay | Admitting: Internal Medicine

## 2015-11-17 NOTE — Telephone Encounter (Signed)
Medical Assistant left message on patient's home and cell voicemail. Voicemail states to give a call back to [place] with [facility] at [phone]. MA provided VO for speech and language evaluation.

## 2015-11-17 NOTE — Telephone Encounter (Signed)
Anda Kraft from [facility] called requesting verbal orders for speech and language evaluation for the pt. Please f/u

## 2015-11-18 ENCOUNTER — Ambulatory Visit: Payer: Medicare HMO | Attending: Internal Medicine | Admitting: Physician Assistant

## 2015-11-18 VITALS — BP 149/95 | HR 87 | Temp 98.3°F | Resp 16 | Wt 200.2 lb

## 2015-11-18 DIAGNOSIS — Z794 Long term (current) use of insulin: Secondary | ICD-10-CM | POA: Insufficient documentation

## 2015-11-18 DIAGNOSIS — I739 Peripheral vascular disease, unspecified: Secondary | ICD-10-CM | POA: Diagnosis not present

## 2015-11-18 DIAGNOSIS — Z8546 Personal history of malignant neoplasm of prostate: Secondary | ICD-10-CM | POA: Insufficient documentation

## 2015-11-18 DIAGNOSIS — Z79899 Other long term (current) drug therapy: Secondary | ICD-10-CM | POA: Diagnosis not present

## 2015-11-18 DIAGNOSIS — I10 Essential (primary) hypertension: Secondary | ICD-10-CM | POA: Diagnosis not present

## 2015-11-18 DIAGNOSIS — Z888 Allergy status to other drugs, medicaments and biological substances status: Secondary | ICD-10-CM | POA: Insufficient documentation

## 2015-11-18 DIAGNOSIS — B029 Zoster without complications: Secondary | ICD-10-CM | POA: Diagnosis not present

## 2015-11-18 DIAGNOSIS — E1100 Type 2 diabetes mellitus with hyperosmolarity without nonketotic hyperglycemic-hyperosmolar coma (NKHHC): Secondary | ICD-10-CM | POA: Insufficient documentation

## 2015-11-18 DIAGNOSIS — E876 Hypokalemia: Secondary | ICD-10-CM | POA: Diagnosis not present

## 2015-11-18 DIAGNOSIS — E1140 Type 2 diabetes mellitus with diabetic neuropathy, unspecified: Secondary | ICD-10-CM | POA: Insufficient documentation

## 2015-11-18 DIAGNOSIS — I2510 Atherosclerotic heart disease of native coronary artery without angina pectoris: Secondary | ICD-10-CM | POA: Insufficient documentation

## 2015-11-18 DIAGNOSIS — E785 Hyperlipidemia, unspecified: Secondary | ICD-10-CM | POA: Diagnosis not present

## 2015-11-18 DIAGNOSIS — Z87891 Personal history of nicotine dependence: Secondary | ICD-10-CM | POA: Diagnosis not present

## 2015-11-18 DIAGNOSIS — Z8673 Personal history of transient ischemic attack (TIA), and cerebral infarction without residual deficits: Secondary | ICD-10-CM | POA: Insufficient documentation

## 2015-11-18 DIAGNOSIS — Z7982 Long term (current) use of aspirin: Secondary | ICD-10-CM | POA: Diagnosis not present

## 2015-11-18 LAB — GLUCOSE, POCT (MANUAL RESULT ENTRY): POC GLUCOSE: 276 mg/dL — AB (ref 70–99)

## 2015-11-18 MED ORDER — POTASSIUM CHLORIDE CRYS ER 20 MEQ PO TBCR: 20.0000 meq | EXTENDED_RELEASE_TABLET | Freq: Every day | ORAL | 0 refills | Status: AC

## 2015-11-18 MED ORDER — POTASSIUM CHLORIDE CRYS ER 20 MEQ PO TBCR: 20.0000 meq | EXTENDED_RELEASE_TABLET | Freq: Every day | ORAL | 0 refills | Status: DC

## 2015-11-18 MED ORDER — ACETAMINOPHEN-CODEINE #3 300-30 MG PO TABS: 1.0000 | ORAL_TABLET | ORAL | 0 refills | Status: AC | PRN

## 2015-11-18 MED ORDER — GLUCOSE BLOOD VI STRP: ORAL_STRIP | 6 refills | Status: DC

## 2015-11-18 MED ORDER — GLUCOSE BLOOD VI STRP: ORAL_STRIP | 6 refills | Status: AC

## 2015-11-18 NOTE — Patient Instructions (Signed)
Check sugar 3 X daily

## 2015-11-18 NOTE — Progress Notes (Signed)
Patient ID: Thomas Sosa, male   DOB: Sosa 09, 1957, 60 y.o.   MRN: [id number]   Thomas Sosa, is a 60 y.o. male  [id number]  [id number]  DOB - 1955/10/28  Subjective:  Chief Complaint and HPI: Thomas Sosa is a 60 y.o. male here today for ED follow up visit after being seen and treated for shingles across his L chest 11/15/2015.  He is on Acyclovir, steroids, and percocet for pain. He took his last percocet this morning for pain and needs something for pain through the weekend.  His blood sugar has also been uncontrolled.  He currently doesn't have any insulin because he hasn't been able to afford it.  He has not been taking it for several months other than when he was inpatient at the hospital or in the ED.  He has now been assigned a case Freight forwarder and nurse and [facility] is helping him get his prescriptions filled.  He feels like the pain and itching with the rash is beginning to improve.   Recent ED notes reviewed.    ROS:   Constitutional:  No f/c, No night sweats, No unexplained weight loss. EENT:  No vision changes, No blurry vision, No hearing changes. No mouth, throat, or ear problems.  Respiratory: No cough, No SOB Cardiac: CP with rash, no palpitations GI:  No abd pain, No N/V/D. GU: No Urinary s/sx Musculoskeletal: No joint pain Neuro: No headache, no dizziness, no motor weakness.  Skin: +rash Endocrine:  No polydipsia. No polyuria.  Psych: Denies SI/HI  No problems updated.  ALLERGIES: Allergies  Allergen Reactions  . Lisinopril Cough    PAST MEDICAL HISTORY: Past Medical History:  Diagnosis Date  . Cancer of prostate ([place]) Dx 2005  . Coronary artery disease   . Diabetes mellitus without complication ([place])   . Diabetic neuropathy ([place]) Dx 2011  . Hemiparesis and alteration of sensations as late effects of stroke ([place]) 02/28/2015  . Hyperlipidemia   . Hypertension   . Peripheral arterial disease ([place])   . Stroke ([place])   . Tobacco abuse      MEDICATIONS AT HOME: Prior to Admission medications   Medication Sig Start Date End Date Taking? Authorizing Provider  acyclovir (ZOVIRAX) 400 MG tablet Take 1 tablet (400 mg total) by mouth 5 (five) times daily. 11/15/15  Yes Drenda Freeze, MD  amitriptyline (ELAVIL) 50 MG tablet Take 1 tablet (50 mg total) by mouth at bedtime. 09/24/15  Yes Eugenie Filler, MD  aspirin EC 325 MG EC tablet Take 1 tablet (325 mg total) by mouth daily. 09/24/15  Yes Eugenie Filler, MD  atorvastatin (LIPITOR) 80 MG tablet Take 1 tablet (80 mg total) by mouth daily. 09/24/15  Yes Eugenie Filler, MD  carvedilol (COREG) 12.5 MG tablet Take 1 tablet (12.5 mg total) by mouth 2 (two) times daily. 09/24/15  Yes Eugenie Filler, MD  glipiZIDE (GLUCOTROL XL) 5 MG 24 hr tablet Take 1 tablet (5 mg total) by mouth daily with breakfast. 09/24/15  Yes Eugenie Filler, MD  glucose blood (ONE TOUCH ULTRA TEST) test strip for testing 3X daily 11/18/15  Yes Dionne Bucy McClung, PA-C  hydrALAZINE (APRESOLINE) 25 MG tablet Take 1 tablet (25 mg total) by mouth 2 (two) times daily. 09/24/15  Yes Eugenie Filler, MD  insulin NPH-regular Human (NOVOLIN 70/30) (70-30) 100 UNIT/ML injection Inject 20 Units into the skin 2 (two) times daily with a meal. 09/24/15  Yes Eugenie Filler, MD  insulin regular (  NOVOLIN R) 100 units/mL injection Inject 0.05 mLs (5 Units total) into the skin 3 (three) times daily before meals. 09/24/15  Yes Eugenie Filler, MD  isosorbide dinitrate (ISORDIL) 5 MG tablet Take 1 tablet (5 mg total) by mouth 2 (two) times daily. 09/24/15  Yes Eugenie Filler, MD  losartan (COZAAR) 25 MG tablet Take 1 tablet (25 mg total) by mouth daily. 09/24/15  Yes Eugenie Filler, MD  methocarbamol (ROBAXIN) 500 MG tablet Take 1 tablet (500 mg total) by mouth 2 (two) times daily. 09/14/15  Yes Tatyana Kirichenko, PA-C  naproxen (NAPROSYN) 500 MG tablet Take 1 tablet (500 mg total) by mouth 2 (two) times daily. 10/24/15  Yes Merryl Hacker, MD  pantoprazole (PROTONIX) 40 MG tablet Take 1 tablet (40 mg total) by mouth 2 (two) times daily before a meal. Take 2 times daily x 2 months, then 1 time daily. 09/24/15  Yes Eugenie Filler, MD  polyethylene glycol [facility] / GLYCOLAX) packet Take 17 g by mouth 2 (two) times daily. 04/27/15  Yes [name], NP  predniSONE (DELTASONE) 20 MG tablet Take 60 mg daily 2 days then 40 mg daily x 2 days then 20 mg daily x 2 days 11/15/15  Yes Drenda Freeze, MD  acetaminophen-codeine (TYLENOL #3) 300-30 MG tablet Take 1 tablet by mouth every 4 (four) hours as needed for moderate pain. 11/18/15   Argentina Donovan, PA-C  gabapentin (NEURONTIN) 600 MG tablet Take 1 tablet (600 mg total) by mouth 3 (three) times daily. Must have office visit for refills 11/15/15   Drenda Freeze, MD  omeprazole (PRILOSEC) 20 MG capsule Take 1 capsule by mouth 1 day or 1 dose. 11/09/15   Historical Provider, MD  potassium chloride SA (K-DUR,KLOR-CON) 20 MEQ tablet Take 1 tablet (20 mEq total) by mouth daily. 11/18/15   Argentina Donovan, PA-C     Objective:  EXAM:   Vitals:   11/18/15 0854  BP: (!) 149/95  Pulse: 87  Resp: 16  Temp: 98.3 F (36.8 C)  TempSrc: Oral  SpO2: 96%  Weight: 200 lb 3.2 oz (90.8 kg)    General appearance : A&OX3. NAD. Non-toxic-appearing HEENT: Atraumatic and Normocephalic.  PERRLA. EOM intact.  TM clear B. Mouth-MMM, post pharynx WNL w/o erythema, No PND. Neck: supple, no JVD. No cervical lymphadenopathy. No thyromegaly Chest/Lungs:  Breathing-non-labored, Good air entry bilaterally, breath sounds normal without rales, rhonchi, or wheezing  CVS: S1 S2 regular, no murmurs, gallops, rubs  Extremities: Bilateral Lower Ext shows no edema, both legs are warm to touch with = pulse throughout Neurology:  CN II-XII grossly intact, Non focal.   Psych:  TP linear. J/I WNL. Normal speech. Appropriate eye contact and affect.  Skin:  Blistering in various stages of healing across L  upper chest and back.  No secondary infection.  Data Review Lab Results  Component Value Date   HGBA1C 10.8 (H) 10/12/2015   HGBA1C 11.8 (H) 09/21/2015   HGBA1C 11.20 01/20/2015     Assessment & Plan   1. Type 2 diabetes mellitus with hyperosmolarity without coma, unspecified long term insulin use status (HCC) Check sugar 3 X daily and record - Glucose (CBG) - glucose blood (ONE TOUCH ULTRA TEST) test strip; for testing 3X daily  Dispense: 100 each; Refill: 6 Resume Insulin.  He has home health/[facility] is helping him get his prescriptions.  He is to call our office if he has trouble getting his insulin.  2. Shingles Improving - acetaminophen-codeine (TYLENOL #3) 300-30 MG tablet; Take 1 tablet by mouth every 4 (four) hours as needed for moderate pain.  Dispense: 30 tablet; Refill: 0 -continue Acyclovir, prednisone and monitor blood sugar  3. Hypokalemia - potassium chloride SA (K-DUR,KLOR-CON) 20 MEQ tablet; Take 1 tablet (20 mEq total) by mouth daily.  Dispense: 14 tablet; Refill: 0 Check BMP or CMP at f/up   Patient have been counseled extensively about nutrition and exercise  Return in about 3 weeks (around 12/09/2015) for establish with new PCP; f/up diabetes, low potassium(he was a patient of [facility]).  The patient was given clear instructions to go to ER or return to medical center if symptoms don't improve, worsen or new problems develop. The patient verbalized understanding. The patient was told to call to get lab results if they haven't heard anything in the next week.     Thomas Caldron, PA-C [facility] and [facility] [place], [place]   11/18/2015, 9:20 AM

## 2015-11-18 NOTE — Progress Notes (Signed)
Pt is in the office today for a hospital f/u Pt was seen for chest pain Pt states his pain level today in the office is a 8 Pt states his pain is coming from his feet, back and chest Pt states he has been taking perocets

## 2015-11-18 NOTE — Addendum Note (Signed)
Addended by: Argentina Donovan on: 11/18/2015 10:44 AM   Modules accepted: Orders

## 2015-11-20 ENCOUNTER — Ambulatory Visit (HOSPITAL_BASED_OUTPATIENT_CLINIC_OR_DEPARTMENT_OTHER): Payer: Medicare HMO | Attending: Internal Medicine

## 2015-12-07 ENCOUNTER — Other Ambulatory Visit: Payer: Self-pay | Admitting: Internal Medicine

## 2015-12-07 DIAGNOSIS — K219 Gastro-esophageal reflux disease without esophagitis: Secondary | ICD-10-CM

## 2015-12-27 ENCOUNTER — Other Ambulatory Visit: Payer: Self-pay | Admitting: Internal Medicine

## 2016-02-06 ENCOUNTER — Other Ambulatory Visit: Payer: Self-pay | Admitting: Physician Assistant

## 2016-03-24 ENCOUNTER — Other Ambulatory Visit: Payer: Self-pay | Admitting: Physician Assistant

## 2016-03-24 ENCOUNTER — Other Ambulatory Visit: Payer: Self-pay | Admitting: Internal Medicine

## 2016-05-10 ENCOUNTER — Other Ambulatory Visit: Payer: Self-pay | Admitting: Internal Medicine

## 2016-05-18 ENCOUNTER — Encounter: Payer: Self-pay | Admitting: Cardiology

## 2016-08-14 ENCOUNTER — Telehealth: Payer: Self-pay | Admitting: *Deleted

## 2016-08-14 NOTE — Telephone Encounter (Signed)
LMTCB on home and cell #'s/sss  Patient needs to send manual transmission, so we can fill out MRI form.

## 2016-08-21 ENCOUNTER — Telehealth: Payer: Self-pay | Admitting: Internal Medicine

## 2016-08-21 NOTE — Telephone Encounter (Signed)
Patient has lost home monitor- will send new one to updated address. I stressed that he needs to find a physician to follow his ICD in [place]. I advised that his ICD will not pick up breathing and if he needs resolution of his nighttime breathing issues he should follow up with his PCP. He verbalizes understanding.

## 2016-08-21 NOTE — Telephone Encounter (Signed)
New Message   1. Has your device fired? No  2. Is you device beeping? No  3. Are you experiencing draining or swelling at device site? No  4. Are you calling to see if we received your device transmission? No  5. Have you passed out? Pt voiced he's stopped breathing twice while he was sleep.

## 2016-10-19 IMAGING — MR MR HEAD W/O CM
9 of 10 series · 35 of 48 positions shown · non-contrast
Comparison: None.

CLINICAL DATA: 58-year-old male with left side numbness and
weakness for 2 days. Unresolved symptoms. Initial encounter.

EXAM:
MRI HEAD WITHOUT CONTRAST
TECHNIQUE: Multiplanar, multiecho pulse sequences of the brain and surrounding
structures were obtained without intravenous contrast.

[Series 3: DWI · axial · 3.0mm · 0.94mm/px · z∈[-119,+7]mm · 9 of 88 slices shown (1 of 4)]
[im 1/88]
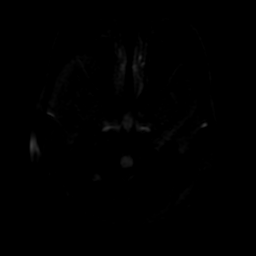
[im 11/88]
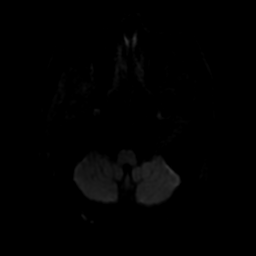
[im 22/88]
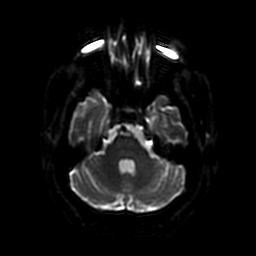
[im 33/88]
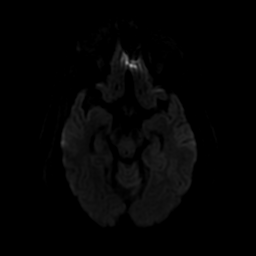
[im 44/88]
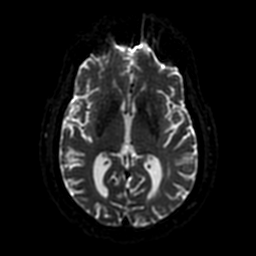
[im 55/88]
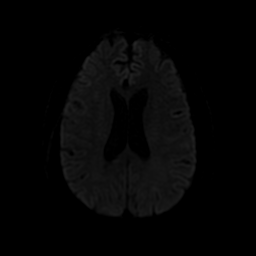
[im 66/88]
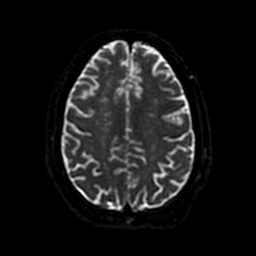
[im 77/88]
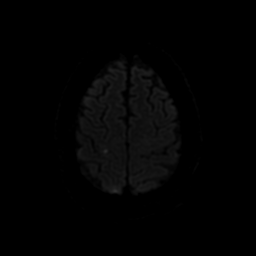
[im 88/88]
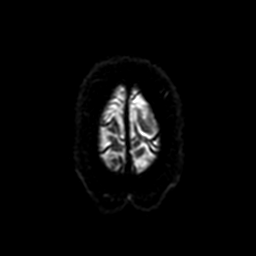

[Series 4: FLAIR · sagittal · 5.0mm · 0.47mm/px · 2 of 23 slices shown (1 of 2)]
[im 1/23]
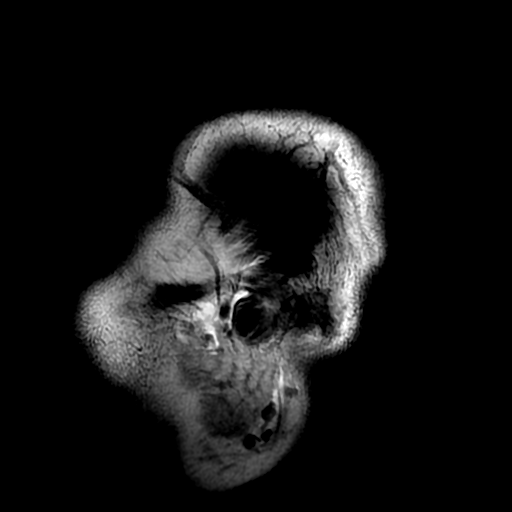
[im 23/23]
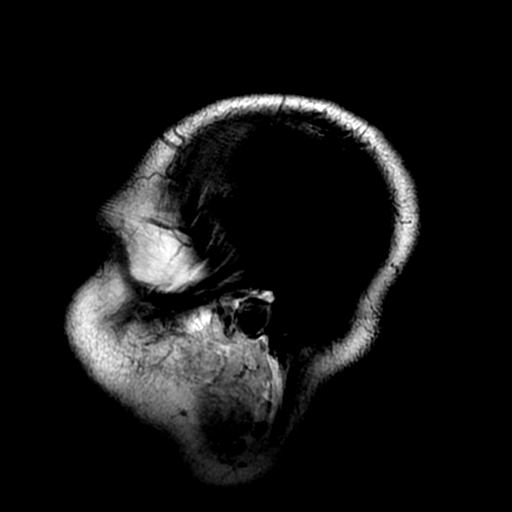

[Series 5: T2 · axial · 5.0mm · 0.47mm/px · z∈[-135,-3]mm · 2 of 24 slices shown (1 of 2)]
[im 1/24]
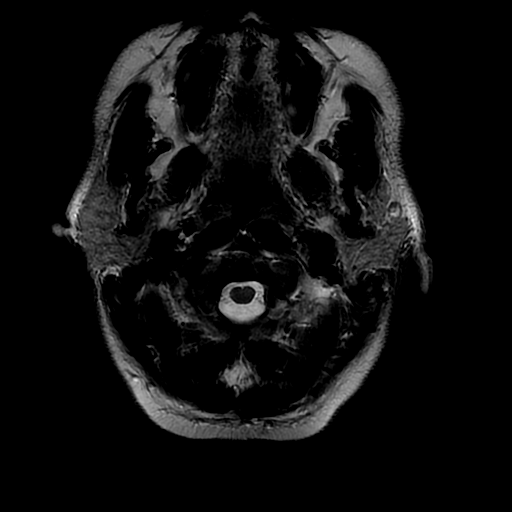
[im 24/24]
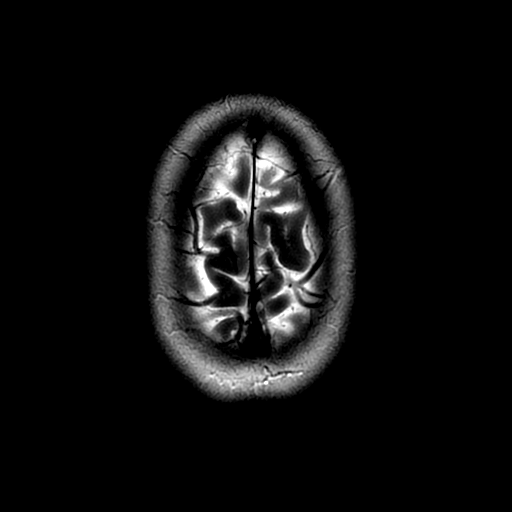

[Series 6: FLAIR · axial · 5.0mm · 0.47mm/px · z∈[-135,-3]mm · 2 of 24 slices shown (2 of 2)]
[im 1/24]
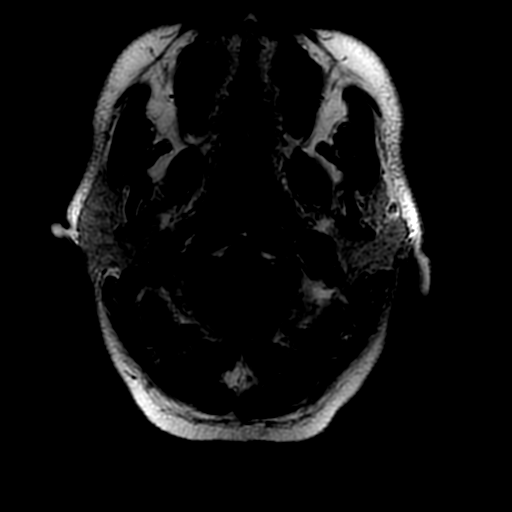
[im 24/24]
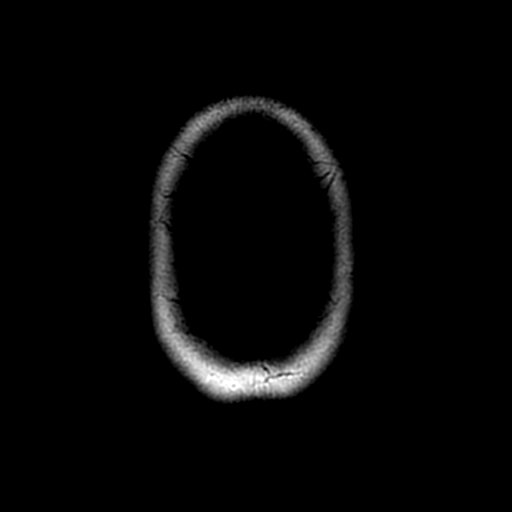

[Series 7: (person_name) · axial · 3.0mm · 0.47mm/px · z∈[-137,-122]mm · 2 of 96 slices shown]
[im 1/96]
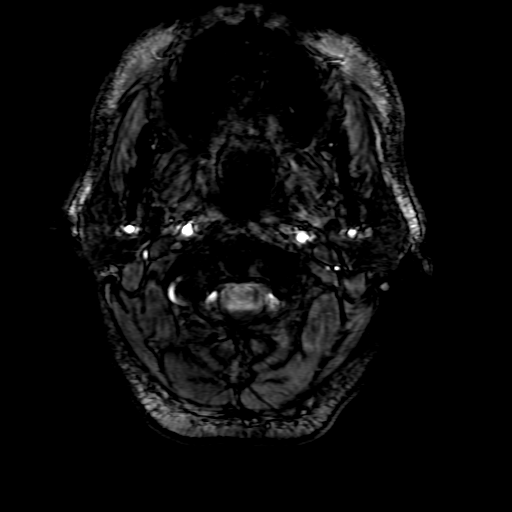
[im 11/96]
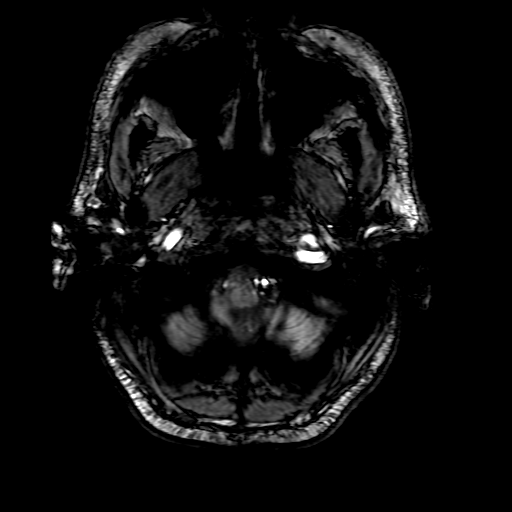

[Series 9: DWI · coronal · 5.0mm · 0.94mm/px · 7 of 74 slices shown (2 of 4)]
[im 1/74]
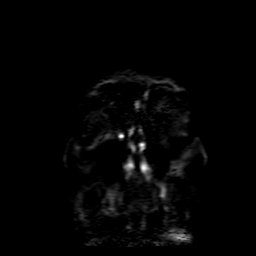
[im 13/74]
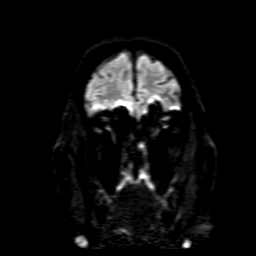
[im 25/74]
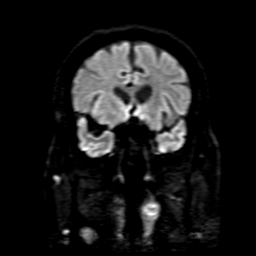
[im 37/74]
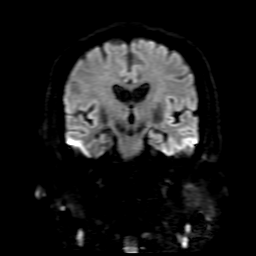
[im 49/74]
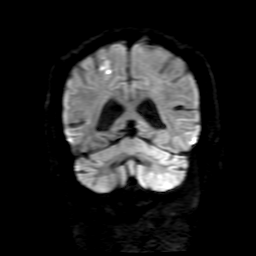
[im 61/74]
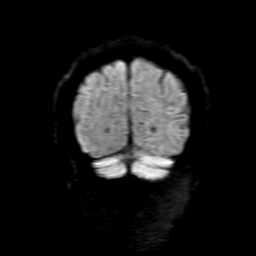
[im 74/74]
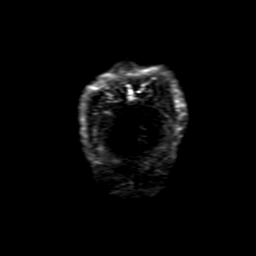

[Series 10: T2 · coronal · 5.0mm · 0.39mm/px · 3 of 29 slices shown (2 of 2)]
[im 1/29]
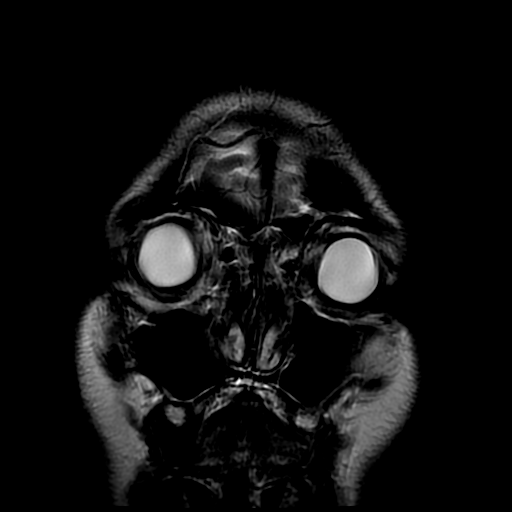
[im 15/29]
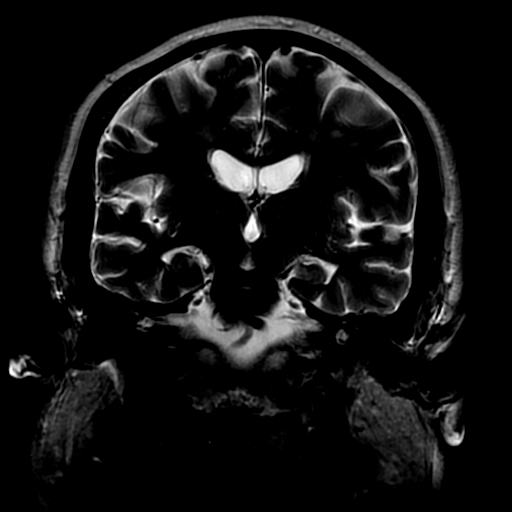
[im 29/29]
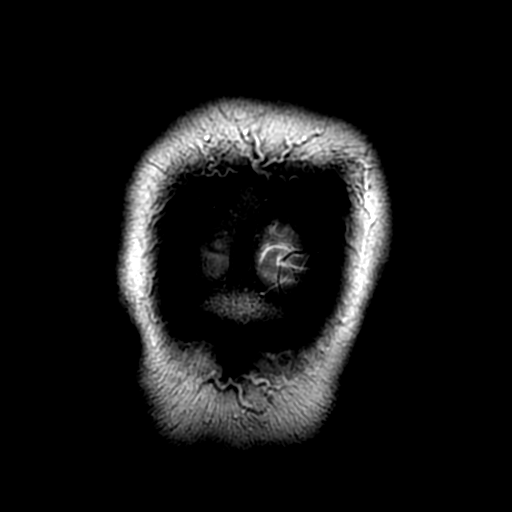

[Series 300: DWI · axial · 3.0mm · 0.94mm/px · z∈[-119,+7]mm · 4 of 44 slices shown (3 of 4)]
[im 1/44]
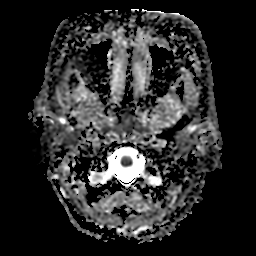
[im 15/44]
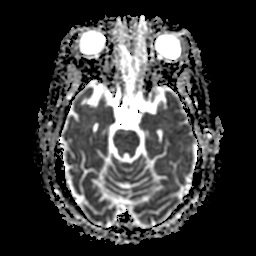
[im 29/44]
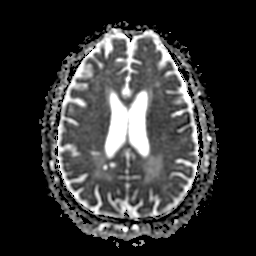
[im 44/44]
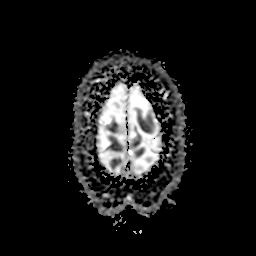

[Series 900: DWI · coronal · 5.0mm · 0.94mm/px · 4 of 37 slices shown (4 of 4)]
[im 1/37]
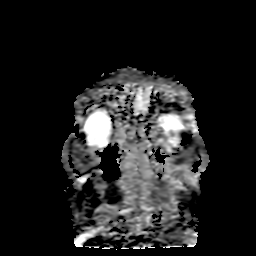
[im 13/37]
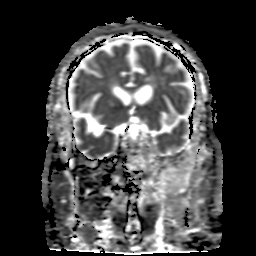
[im 25/37]
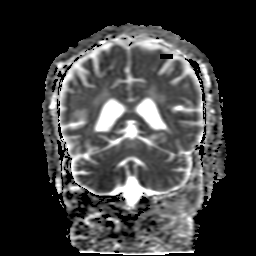
[im 37/37]
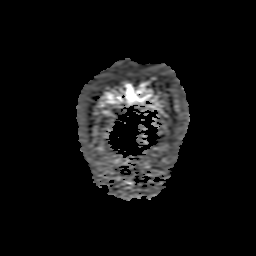

[35 of 48 positions shown; findings below may reference images not displayed]

FINDINGS: Clustered small foci of abnormal trace diffusion in the right
peri-Rolandic cortex and subcortical white matter near the right
motor strip (series 9, image 13). The most confluent foci do appear
mildly restricted on ADC (series 300, image 38). Mild T2 and FLAIR
hyperintensity associated. There is evidence of associated petechial
hemorrhage (series 7, image 86), but no mass effect.

There are also several small foci of similar abnormal trace
diffusion in the right occipital pole involving both cortex and
white matter (series 3 images 19-22).

No posterior fossa or contralateral left hemisphere diffusion
abnormality.

Major intracranial vascular flow voids are preserved, there is a
mild degree of intracranial artery dolichoectasia.

Patchy bilateral cerebral white matter T2 and FLAIR hyper intensity,
with areas which most resemble small chronic white matter lacunar
infarcts (series 6, image 17). No definite cortical
encephalomalacia. Occasional chronic micro hemorrhages in the brain
suggested on susceptibility weighted imaging. Deep gray matter
nuclei are within normal limits. Mild nonspecific patchy T2
hyperintensity in the pons. Small chronic right cerebellar lacunar
infarct on series 5, image 6.

No midline shift, mass effect, evidence of mass lesion,
ventriculomegaly, extra-axial collection or acute intracranial
hemorrhage. Cervicomedullary junction and pituitary are within
normal limits. Visible internal auditory structures appear normal.
Mastoids are clear. Moderate paranasal sinus mucosal thickening.
Visualized orbit soft tissues are within normal limits. Scalp soft
tissues are within normal limits. Visualized bone marrow signal is
within normal limits. Negative for age visualized cervical spine.
IMPRESSION: 1. Small subacute appearing infarcts in the right peri-Rolandic
cortex and subcortical white matter. Similar small infarcts in the
right occipital pole. Minimal petechial hemorrhage with no mass
effect.
2. Evidence of underlying chronic small vessel ischemia in both
cerebral hemispheres, and the right cerebellum.
